# Patient Record
Sex: Female | Born: 1955
Health system: Southern US, Community
[De-identification: ages and names within clinical notes are randomized; demographics above are authoritative.]

## PROBLEM LIST (undated history)

## (undated) DIAGNOSIS — K5190 Ulcerative colitis, unspecified, without complications: Secondary | ICD-10-CM

## (undated) DIAGNOSIS — C20 Malignant neoplasm of rectum: Secondary | ICD-10-CM

## (undated) DIAGNOSIS — K529 Noninfective gastroenteritis and colitis, unspecified: Secondary | ICD-10-CM

## (undated) DIAGNOSIS — H2513 Age-related nuclear cataract, bilateral: Secondary | ICD-10-CM

## (undated) DIAGNOSIS — B0052 Herpesviral keratitis: Secondary | ICD-10-CM

## (undated) DIAGNOSIS — I10 Essential (primary) hypertension: Secondary | ICD-10-CM

## (undated) HISTORY — DX: Herpesviral keratitis: B00.52

## (undated) HISTORY — PX: COLONOSCOPY: SHX174

## (undated) HISTORY — PX: BREAST LUMPECTOMY: SHX2

## (undated) HISTORY — DX: Age-related nuclear cataract, bilateral: H25.13

## (undated) HISTORY — DX: Noninfective gastroenteritis and colitis, unspecified: K52.9

## (undated) HISTORY — DX: Essential (primary) hypertension: I10

## (undated) HISTORY — DX: Ulcerative colitis, unspecified, without complications: K51.90

## (undated) HISTORY — PX: ABDOMINAL HYSTERECTOMY: SHX81

## (undated) HISTORY — PX: TUBAL LIGATION: SHX77

---
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
## 1981-05-20 HISTORY — PX: BREAST CYST ASPIRATION: SHX578

## 2000-12-12 ENCOUNTER — Encounter: Admission: RE | Admit: 2000-12-12 | Discharge: 2000-12-12 | Payer: Self-pay | Admitting: Family Medicine

## 2000-12-12 ENCOUNTER — Encounter: Payer: Self-pay | Admitting: Family Medicine

## 2008-02-19 ENCOUNTER — Other Ambulatory Visit: Admission: RE | Admit: 2008-02-19 | Discharge: 2008-02-19 | Payer: Self-pay | Admitting: Family Medicine

## 2011-09-16 ENCOUNTER — Other Ambulatory Visit (HOSPITAL_COMMUNITY): Payer: Self-pay | Admitting: Family Medicine

## 2011-09-16 DIAGNOSIS — Z1231 Encounter for screening mammogram for malignant neoplasm of breast: Secondary | ICD-10-CM

## 2011-10-10 ENCOUNTER — Ambulatory Visit (HOSPITAL_COMMUNITY)
Admission: RE | Admit: 2011-10-10 | Discharge: 2011-10-10 | Disposition: A | Discharge status: Home / Self Care | Payer: BC Managed Care – PPO | Source: Ambulatory Visit | Attending: Family Medicine | Admitting: Family Medicine

## 2011-10-10 DIAGNOSIS — Z1231 Encounter for screening mammogram for malignant neoplasm of breast: Secondary | ICD-10-CM | POA: Insufficient documentation

## 2011-10-24 ENCOUNTER — Other Ambulatory Visit: Payer: Self-pay | Admitting: Family Medicine

## 2011-10-24 DIAGNOSIS — R928 Other abnormal and inconclusive findings on diagnostic imaging of breast: Secondary | ICD-10-CM

## 2011-10-31 ENCOUNTER — Ambulatory Visit
Admission: RE | Admit: 2011-10-31 | Discharge: 2011-10-31 | Disposition: A | Discharge status: Home / Self Care | Payer: BC Managed Care – PPO | Source: Ambulatory Visit | Attending: Family Medicine | Admitting: Family Medicine

## 2011-10-31 DIAGNOSIS — R928 Other abnormal and inconclusive findings on diagnostic imaging of breast: Secondary | ICD-10-CM

## 2012-06-04 ENCOUNTER — Ambulatory Visit (INDEPENDENT_AMBULATORY_CARE_PROVIDER_SITE_OTHER): Payer: BC Managed Care – PPO | Admitting: Family Medicine

## 2012-06-04 ENCOUNTER — Ambulatory Visit: Payer: BC Managed Care – PPO

## 2012-06-04 VITALS — BP 100/64 | HR 94 | Temp 99.2°F | Resp 16 | Ht 65.5 in | Wt 187.6 lb

## 2012-06-04 DIAGNOSIS — R5383 Other fatigue: Secondary | ICD-10-CM

## 2012-06-04 DIAGNOSIS — J111 Influenza due to unidentified influenza virus with other respiratory manifestations: Secondary | ICD-10-CM

## 2012-06-04 DIAGNOSIS — R0602 Shortness of breath: Secondary | ICD-10-CM

## 2012-06-04 DIAGNOSIS — J069 Acute upper respiratory infection, unspecified: Secondary | ICD-10-CM

## 2012-06-04 LAB — POCT CBC
HCT, POC: 34 % — AB (ref 37.7–47.9)
Hemoglobin: 10.3 g/dL — AB (ref 12.2–16.2)
MCH, POC: 26.9 pg — AB (ref 27–31.2)
MCV: 88.7 fL (ref 80–97)
RBC: 3.83 M/uL — AB (ref 4.04–5.48)
WBC: 9.5 10*3/uL (ref 4.6–10.2)

## 2012-06-04 LAB — GLUCOSE, POCT (MANUAL RESULT ENTRY): POC Glucose: 152 mg/dl — AB (ref 70–99)

## 2012-06-04 MED ORDER — HYDROCOD POLST-CHLORPHEN POLST 10-8 MG/5ML PO LQCR: 5.0000 mL | Freq: Two times a day (BID) | ORAL | Status: DC | PRN

## 2012-06-04 MED ORDER — OSELTAMIVIR PHOSPHATE 75 MG PO CAPS: 75.0000 mg | ORAL_CAPSULE | Freq: Two times a day (BID) | ORAL | Status: DC

## 2012-06-04 NOTE — Patient Instructions (Addendum)
1. URI (upper respiratory infection)  POCT CBC, POCT Influenza A/B, POCT glucose (manual entry), DG Chest 2 View  2. Influenza    3. Fatigue  POCT CBC, POCT Influenza A/B, POCT glucose (manual entry), DG Chest 2 View  4. SOB (shortness of breath)  POCT CBC, POCT Influenza A/B, POCT glucose (manual entry), DG Chest 2 View   Influenza Facts Flu (influenza) is a contagious respiratory illness caused by the influenza viruses. It can cause mild to severe illness. While most healthy people recover from the flu without specific treatment and without complications, older people, young children, and people with certain health conditions are at higher risk for serious complications from the flu, including death. CAUSES   The flu virus is spread from person to person by respiratory droplets from coughing and sneezing.  A person can also become infected by touching an object or surface with a virus on it and then touching their mouth, eye or nose.  Adults may be able to infect others from 1 day before symptoms occur and up to 7 days after getting sick. So it is possible to give someone the flu even before you know you are sick and continue to infect others while you are sick. SYMPTOMS   Fever (usually high).  Headache.  Tiredness (can be extreme).  Cough.  Sore throat.  Runny or stuffy nose.  Body aches.  Diarrhea and vomiting may also occur, particularly in children.  These symptoms are referred to as "flu-like symptoms". A lot of different illnesses, including the common cold, can have similar symptoms. DIAGNOSIS   There are tests that can determine if you have the flu as long you are tested within the first 2 or 3 days of illness.  A doctor's exam and additional tests may be needed to identify if you have a disease that is a complicating the flu. RISKS AND COMPLICATIONS  Some of the complications caused by the flu include:  Bacterial pneumonia or progressive pneumonia caused by the  flu virus.  Loss of body fluids (dehydration).  Worsening of chronic medical conditions, such as heart failure, asthma, or diabetes.  Sinus problems and ear infections. HOME CARE INSTRUCTIONS   Seek medical care early on.  If you are at high risk from complications of the flu, consult your health-care provider as soon as you develop flu-like symptoms. Those at high risk for complications include:  People 65 years or older.  People with chronic medical conditions, including diabetes.  Pregnant women.  Young children.  Your caregiver may recommend use of an antiviral medication to help treat the flu.  If you get the flu, get plenty of rest, drink a lot of liquids, and avoid using alcohol and tobacco.  You can take over-the-counter medications to relieve the symptoms of the flu if your caregiver approves. (Never give aspirin to children or teenagers who have flu-like symptoms, particularly fever). PREVENTION  The single best way to prevent the flu is to get a flu vaccine each fall. Other measures that can help protect against the flu are:  Antiviral Medications  A number of antiviral drugs are approved for use in preventing the flu. These are prescription medications, and a doctor should be consulted before they are used.  Habits for [place] your nose and mouth with a tissue when you cough or sneeze, throw the tissue away after you use it.  Wash your hands often with soap and water, especially after you cough or sneeze. If you  are not near water, use an alcohol-based hand cleaner.  Avoid people who are sick.  If you get the flu, stay home from work or school. Avoid contact with other people so that you do not make them sick, too.  Try not to touch your eyes, nose, or mouth as germs ore often spread this way. IN CHILDREN, EMERGENCY WARNING SIGNS THAT NEED URGENT MEDICAL ATTENTION:  Fast breathing or trouble breathing.  Bluish skin color.  Not drinking enough  fluids.  Not waking up or not interacting.  Being so irritable that the child does not want to be held.  Flu-like symptoms improve but then return with fever and worse cough.  Fever with a rash. IN ADULTS, EMERGENCY WARNING SIGNS THAT NEED URGENT MEDICAL ATTENTION:  Difficulty breathing or shortness of breath.  Pain or pressure in the chest or abdomen.  Sudden dizziness.  Confusion.  Severe or persistent vomiting. SEEK IMMEDIATE MEDICAL CARE IF:  You or someone you know is experiencing any of the symptoms above. When you arrive at the emergency center,report that you think you have the flu. You may be asked to wear a mask and/or sit in a secluded area to protect others from getting sick. MAKE SURE YOU:   Understand these instructions.  Monitor your condition.  Seek medical care if you are getting worse, or not improving. Document Released: 05/09/2003 Document Revised: 07/29/2011 Document Reviewed: 02/02/2009 [facility] Patient Information 2013 [place].

## 2012-06-04 NOTE — Progress Notes (Signed)
Subjective:    Patient ID: Leah Bailey, female    DOB: [birth year], 57 y.o.   MRN: [id number]  HPIThis 57 y.o. female presents for evaluation of cold symptoms. Onset of acute symptoms today.   +HA; no ST; no flu vaccine this season.  +chills/fevers; no sweats.  No ear pain.   +rhinorrhea two weeks; greenish.  +nasal congestion; +PND.  +coughing for two weeks with improvement.  Coughing less.  +sputum green.  +SOB started today.  +vomiting x 8 two days ago; no recurrence.  No diarrhea.  URI two weeks ago with nasal congestion, rhinorrhea, cough.  No medications.  Works at [employer] but none in two days.  No tobacco.    Review of Systems  Constitutional: Positive for fever, chills and fatigue.  HENT: Positive for congestion, rhinorrhea and postnasal drip. Negative for ear pain, sore throat, mouth sores and sinus pressure.   Respiratory: Positive for cough and shortness of breath. Negative for wheezing and stridor.   Gastrointestinal: Positive for nausea and vomiting. Negative for abdominal pain and diarrhea.  Skin: Negative for rash.  Neurological: Positive for headaches. Negative for dizziness.    Past Medical History  Diagnosis Date  . Hypertension     Past Surgical History  Procedure Date  . Abdominal hysterectomy   . Tubal ligation     Prior to Admission medications   Medication Sig Start Date End Date Taking? Authorizing Provider  aspirin 81 MG tablet Take 81 mg by mouth daily.   Yes Historical Provider, MD  lisinopril (PRINIVIL,ZESTRIL) 20 MG tablet Take 20 mg by mouth daily.   Yes Historical Provider, MD  Multiple Vitamins-Calcium (ONE-A-DAY WOMENS PO) Take 1 tablet by mouth daily.   Yes Historical Provider, MD    No Known Allergies  History   Social History  . Marital Status: Married    Spouse Name: N/A    Number of Children: N/A  . Years of Education: N/A   Occupational History  . Not on file.   Social History Main Topics    . Smoking status: Never Smoker   . Smokeless tobacco: Not on file  . Alcohol Use: No  . Drug Use: No  . Sexually Active: Yes   Other Topics Concern  . Not on file   Social History Narrative  . No narrative on file    Family History  Problem Relation Age of Onset  . Heart disease Mother   . Diabetes Mother   . Heart attack Father   . Diabetes Sister   . Diabetes Brother        Objective:   Physical Exam  Nursing note and vitals reviewed. Constitutional: She is oriented to person, place, and time. She appears well-developed and well-nourished. No distress.  HENT:  Head: Normocephalic and atraumatic.  Right Ear: External ear normal.  Left Ear: External ear normal.  Nose: Mucosal edema and rhinorrhea present.  Mouth/Throat: Oropharynx is clear and moist.  Eyes: Conjunctivae normal are normal. Pupils are equal, round, and reactive to light.  Neck: Normal range of motion. Neck supple.  Cardiovascular: Normal rate, regular rhythm and normal heart sounds.  Exam reveals no gallop and no friction rub.   No murmur heard. Pulmonary/Chest: Effort normal and breath sounds normal. She has no wheezes. She has no rales.  Abdominal: Soft. Bowel sounds are normal. There is no tenderness. There is no rebound and no guarding.  Lymphadenopathy:    She has no cervical adenopathy.  Neurological:  She is alert and oriented to person, place, and time.  Skin: Skin is warm and dry. No rash noted. She is not diaphoretic.  Psychiatric: She has a normal mood and affect. Her behavior is normal.    Results for orders placed in visit on 06/04/12  POCT CBC      Component Value Range   WBC 9.5  4.6 - 10.2 K/uL   Lymph, poc 1.5  0.6 - 3.4   POC LYMPH PERCENT 15.3  10 - 50 %L   MID (cbc) 0.4  0 - 0.9   POC MID % 4.4  0 - 12 %M   POC Granulocyte 7.6 (*) 2 - 6.9   Granulocyte percent 80.3 (*) 37 - 80 %G   RBC 3.83 (*) 4.04 - 5.48 M/uL   Hemoglobin 10.3 (*) 12.2 - 16.2 g/dL   HCT, POC 34.0 (*) 37.7  - 47.9 %   MCV 88.7  80 - 97 fL   MCH, POC 26.9 (*) 27 - 31.2 pg   MCHC 30.3 (*) 31.8 - 35.4 g/dL   RDW, POC 14.4     Platelet Count, POC 349  142 - 424 K/uL   MPV 7.6  0 - 99.8 fL  POCT INFLUENZA A/B      Component Value Range   Influenza A, POC Negative     Influenza B, POC Negative    GLUCOSE, POCT (MANUAL RESULT ENTRY)      Component Value Range   POC Glucose 152 (*) 70 - 99 mg/dl   [facility] reading (PRIMARY) by  Dr. Tamala Julian.  CXR: NAD     Assessment & Plan:   1. URI (upper respiratory infection)  POCT CBC, POCT Influenza A/B, POCT glucose (manual entry), DG Chest 2 View  2. Influenza    3. Fatigue  POCT CBC, POCT Influenza A/B, POCT glucose (manual entry), DG Chest 2 View  4. SOB (shortness of breath)  POCT CBC, POCT Influenza A/B, POCT glucose (manual entry), DG Chest 2 View     1.  Influenza:  New.  Rx for Tamiflu, Tussionex provided.  Recommend Mucinex DM bid PRN during the day. Recommend Ibuprofen or Tylenol for fever, sore throat, body aches. RTC for acute worsening, worsening SOB, persistent symptoms.  [facility] note for 06/05/12. 2. SOB: New.  Associated with influenza symptoms.  CXR negative; pulse oximetry normal.  No respiratory distress in office.  RTC for acute worsening.   Meds ordered this encounter  Medications  . aspirin 81 MG tablet    Sig: Take 81 mg by mouth daily.  Marland Kitchen lisinopril (PRINIVIL,ZESTRIL) 20 MG tablet    Sig: Take 20 mg by mouth daily.  . Multiple Vitamins-Calcium (ONE-A-DAY WOMENS PO)    Sig: Take 1 tablet by mouth daily.  Marland Kitchen oseltamivir (TAMIFLU) 75 MG capsule    Sig: Take 1 capsule (75 mg total) by mouth 2 (two) times daily.    Dispense:  10 capsule    Refill:  0  . chlorpheniramine-HYDROcodone (TUSSIONEX PENNKINETIC ER) 10-8 MG/5ML LQCR    Sig: Take 5 mLs by mouth every 12 (twelve) hours as needed.    Dispense:  240 mL    Refill:  0

## 2013-02-22 ENCOUNTER — Other Ambulatory Visit (HOSPITAL_COMMUNITY): Payer: Self-pay | Admitting: Family Medicine

## 2013-02-22 DIAGNOSIS — Z1231 Encounter for screening mammogram for malignant neoplasm of breast: Secondary | ICD-10-CM

## 2013-03-05 ENCOUNTER — Ambulatory Visit (HOSPITAL_COMMUNITY)
Admission: RE | Admit: 2013-03-05 | Discharge: 2013-03-05 | Disposition: A | Discharge status: Home / Self Care | Payer: BC Managed Care – PPO | Source: Ambulatory Visit | Attending: Family Medicine | Admitting: Family Medicine

## 2013-03-05 DIAGNOSIS — Z1231 Encounter for screening mammogram for malignant neoplasm of breast: Secondary | ICD-10-CM | POA: Insufficient documentation

## 2013-03-05 LAB — HM MAMMOGRAPHY

## 2013-10-16 ENCOUNTER — Ambulatory Visit (INDEPENDENT_AMBULATORY_CARE_PROVIDER_SITE_OTHER): Payer: BC Managed Care – PPO | Admitting: Family Medicine

## 2013-10-16 VITALS — BP 120/80 | HR 72 | Temp 98.0°F | Resp 20 | Ht 65.5 in | Wt 189.2 lb

## 2013-10-16 DIAGNOSIS — H109 Unspecified conjunctivitis: Secondary | ICD-10-CM

## 2013-10-16 DIAGNOSIS — H538 Other visual disturbances: Secondary | ICD-10-CM

## 2013-10-16 MED ORDER — POLYMYXIN B-TRIMETHOPRIM 10000-0.1 UNIT/ML-% OP SOLN: 2.0000 [drp] | Freq: Four times a day (QID) | OPHTHALMIC | Status: DC

## 2013-10-16 NOTE — Progress Notes (Signed)
Chief Complaint:  Chief Complaint  Patient presents with  . Eye Pain    right eye is swollen, red, burning some, itches some and this started today.  did have a torn cornea back in feb.  vision is blurry.      HPI: Leah Bailey is a 58 y.o. female who is here for  Right eye redness, swelling, itching, and burning since Thursday. Similar to sxs she had with corneal abrasion except for swelling, she has used systane and did not get better. Today swelling of her eyelieds. She has had a history of corneal abrasion prior to this on same eye after waking up one day , she thinks at that time may have scratched her eye on sheets, but they do not really know. She was put on drops for this. She does not know if there is any scarring associated with it after it healed. Today she has some light sensitivity , blurred vision, drainage and clear dc. No fevers or chills. No pain in the back of her eyeball that has worsen. She has no facial pain or burning or rash. She denies having shingles in the past.   Past Medical History  Diagnosis Date  . Hypertension    Past Surgical History  Procedure Laterality Date  . Abdominal hysterectomy    . Tubal ligation     History   Social History  . Marital Status: Married    Spouse Name: N/A    Number of Children: N/A  . Years of Education: N/A   Social History Main Topics  . Smoking status: Never Smoker   . Smokeless tobacco: None  . Alcohol Use: No  . Drug Use: No  . Sexual Activity: Yes   Other Topics Concern  . None   Social History Narrative  . None   Family History  Problem Relation Age of Onset  . Heart disease Mother   . Diabetes Mother   . Heart attack Father   . Diabetes Sister   . Diabetes Brother    No Known Allergies Prior to Admission medications   Medication Sig Start Date End Date Taking? Authorizing Provider  aspirin 81 MG tablet Take 81 mg by mouth daily.   Yes Historical Provider, MD  ferrous sulfate 325 (65  FE) MG EC tablet Take 325 mg by mouth daily with breakfast.   Yes Historical Provider, MD  lisinopril (PRINIVIL,ZESTRIL) 20 MG tablet Take 20 mg by mouth daily.   Yes Historical Provider, MD  Multiple Vitamins-Calcium (ONE-A-DAY WOMENS PO) Take 1 tablet by mouth daily.   Yes Historical Provider, MD     ROS: The patient denies fevers, chills, night sweats, unintentional weight loss, chest pain, palpitations, wheezing, dyspnea on exertion, nausea, vomiting, abdominal pain, dysuria, hematuria, melena, numbness, weakness, or tingling.  All other systems have been reviewed and were otherwise negative with the exception of those mentioned in the HPI and as above.    PHYSICAL EXAM: Filed Vitals:   10/16/13 1536  BP: 120/80  Pulse: 72  Temp: 98 F (36.7 C)  Resp: 20   Filed Vitals:   10/16/13 1536  Height: 5' 5.5" (1.664 m)  Weight: 189 lb 3.2 oz (85.821 kg)   Body mass index is 30.99 kg/(m^2).  General: Alert, no acute distress HEENT:  Normocephalic, atraumatic, oropharynx patent. EOMI, PERRLA, right eye conjunctival erythema, + dc, , no pick up of fluoroscein dye however there is a area in the center of her eye that  has a gelatinous/fluid wave Cardiovascular:  Regular rate and rhythm, no rubs murmurs or gallops.  No Carotid bruits, radial pulse intact. No pedal edema.  Respiratory: Clear to auscultation bilaterally.  No wheezes, rales, or rhonchi.  No cyanosis, no use of accessory musculature GI: No organomegaly, abdomen is soft and non-tender, positive bowel sounds.  No masses. Skin: No rashes. Neurologic: Facial musculature symmetric. Psychiatric: Patient is appropriate throughout our interaction. Lymphatic: No cervical lymphadenopathy Musculoskeletal: Gait intact  LABS: Results for orders placed in visit on 06/04/12  POCT CBC      Result Value Ref Range   WBC 9.5  4.6 - 10.2 K/uL   Lymph, poc 1.5  0.6 - 3.4   POC LYMPH PERCENT 15.3  10 - 50 %L   MID (cbc) 0.4  0 - 0.9   POC  MID % 4.4  0 - 12 %M   POC Granulocyte 7.6 (*) 2 - 6.9   Granulocyte percent 80.3 (*) 37 - 80 %G   RBC 3.83 (*) 4.04 - 5.48 M/uL   Hemoglobin 10.3 (*) 12.2 - 16.2 g/dL   HCT, POC 34.0 (*) 37.7 - 47.9 %   MCV 88.7  80 - 97 fL   MCH, POC 26.9 (*) 27 - 31.2 pg   MCHC 30.3 (*) 31.8 - 35.4 g/dL   RDW, POC 14.4     Platelet Count, POC 349  142 - 424 K/uL   MPV 7.6  0 - 99.8 fL  POCT INFLUENZA A/B      Result Value Ref Range   Influenza A, POC Negative     Influenza B, POC Negative    GLUCOSE, POCT (MANUAL RESULT ENTRY)      Result Value Ref Range   POC Glucose 152 (*) 70 - 99 mg/dl     EKG/XRAY:   Primary read interpreted [facility]. Marin Comment at [facility].   ASSESSMENT/PLAN: Encounter Diagnoses  Name Primary?  . Conjunctivitis Yes  . Blurred vision    58 y/o female with prior corneal abrasion She has no uptake of fluoroscein dye on eye exam but I suspect there is something in the center of her eye/iris. There is a fluid wave of the sclera  She will be given polytrim gtt and asked to see optho ( she already has one) on Monday Morning.  Vision is 20/50 and 20/30 respectively right/left  Gross sideeffects, risk and benefits, and alternatives of medications d/w patient. Patient is aware that all medications have potential sideeffects and we are unable to predict every sideeffect or drug-drug interaction that may occur.  Glenford Bayley, DO 10/16/2013 5:19 PM

## 2013-10-20 DIAGNOSIS — B0052 Herpesviral keratitis: Secondary | ICD-10-CM

## 2013-10-20 HISTORY — DX: Herpesviral keratitis: B00.52

## 2014-11-10 ENCOUNTER — Encounter: Payer: Self-pay | Admitting: *Deleted

## 2014-11-30 DIAGNOSIS — H2513 Age-related nuclear cataract, bilateral: Secondary | ICD-10-CM

## 2014-11-30 HISTORY — DX: Age-related nuclear cataract, bilateral: H25.13

## 2015-04-03 ENCOUNTER — Other Ambulatory Visit: Payer: Self-pay

## 2015-04-03 DIAGNOSIS — Z1231 Encounter for screening mammogram for malignant neoplasm of breast: Secondary | ICD-10-CM

## 2015-04-24 ENCOUNTER — Ambulatory Visit
Admission: RE | Admit: 2015-04-24 | Discharge: 2015-04-24 | Disposition: A | Discharge status: Home / Self Care | Payer: 59 | Source: Ambulatory Visit

## 2015-04-24 DIAGNOSIS — Z1231 Encounter for screening mammogram for malignant neoplasm of breast: Secondary | ICD-10-CM

## 2017-10-24 ENCOUNTER — Encounter: Payer: Self-pay | Admitting: Physician Assistant

## 2017-10-24 ENCOUNTER — Other Ambulatory Visit: Payer: Self-pay

## 2017-10-24 ENCOUNTER — Ambulatory Visit (INDEPENDENT_AMBULATORY_CARE_PROVIDER_SITE_OTHER): Payer: BLUE CROSS/BLUE SHIELD | Admitting: Physician Assistant

## 2017-10-24 ENCOUNTER — Encounter: Payer: Self-pay | Admitting: Internal Medicine

## 2017-10-24 VITALS — BP 130/84 | HR 75 | Temp 98.8°F | Resp 18 | Ht 65.55 in | Wt 171.8 lb

## 2017-10-24 DIAGNOSIS — Z0000 Encounter for general adult medical examination without abnormal findings: Secondary | ICD-10-CM

## 2017-10-24 DIAGNOSIS — Z1159 Encounter for screening for other viral diseases: Secondary | ICD-10-CM | POA: Diagnosis not present

## 2017-10-24 DIAGNOSIS — Z1329 Encounter for screening for other suspected endocrine disorder: Secondary | ICD-10-CM | POA: Diagnosis not present

## 2017-10-24 DIAGNOSIS — Z13228 Encounter for screening for other metabolic disorders: Secondary | ICD-10-CM

## 2017-10-24 DIAGNOSIS — Z1211 Encounter for screening for malignant neoplasm of colon: Secondary | ICD-10-CM

## 2017-10-24 DIAGNOSIS — Z1389 Encounter for screening for other disorder: Secondary | ICD-10-CM

## 2017-10-24 DIAGNOSIS — Z130 Encounter for screening for diseases of the blood and blood-forming organs and certain disorders involving the immune mechanism: Secondary | ICD-10-CM

## 2017-10-24 DIAGNOSIS — Z1231 Encounter for screening mammogram for malignant neoplasm of breast: Secondary | ICD-10-CM

## 2017-10-24 DIAGNOSIS — Z13220 Encounter for screening for lipoid disorders: Secondary | ICD-10-CM

## 2017-10-24 DIAGNOSIS — Z114 Encounter for screening for human immunodeficiency virus [HIV]: Secondary | ICD-10-CM | POA: Diagnosis not present

## 2017-10-24 DIAGNOSIS — I10 Essential (primary) hypertension: Secondary | ICD-10-CM | POA: Diagnosis not present

## 2017-10-24 MED ORDER — LISINOPRIL-HYDROCHLOROTHIAZIDE 20-25 MG PO TABS: 1.0000 | ORAL_TABLET | Freq: Every day | ORAL | 3 refills | Status: AC

## 2017-10-24 NOTE — Patient Instructions (Addendum)
   IF you received an x-ray today, you will receive an invoice from [facility]. Please contact [facility] at [phone] with questions or concerns regarding your invoice.   IF you received labwork today, you will receive an invoice from [facility]. Please contact [facility] at [phone] with questions or concerns regarding your invoice.   Our billing staff will not be able to assist you with questions regarding bills from these companies.  You will be contacted with the lab results as soon as they are available. The fastest way to get your results is to activate your My Chart account. Instructions are located on the last page of this paperwork. If you have not heard from us regarding the results in 2 weeks, please contact this office.        IF you received an x-ray today, you will receive an invoice from [facility]. Please contact [facility] at [phone] with questions or concerns regarding your invoice.   IF you received labwork today, you will receive an invoice from [facility]. Please contact [facility] at [phone] with questions or concerns regarding your invoice.   Our billing staff will not be able to assist you with questions regarding bills from these companies.  You will be contacted with the lab results as soon as they are available. The fastest way to get your results is to activate your My Chart account. Instructions are located on the last page of this paperwork. If you have not heard from us regarding the results in 2 weeks, please contact this office.     Preventive Care 40-64 Years, Female Preventive care refers to lifestyle choices and visits with your health care provider that can promote health and wellness. What does preventive care include?  A yearly physical exam. This is also called an annual well check.  Dental exams once or twice a year.  Routine eye exams. Ask your health care provider how often you should have  your eyes checked.  Personal lifestyle choices, including: ? Daily care of your teeth and gums. ? Regular physical activity. ? Eating a healthy diet. ? Avoiding tobacco and drug use. ? Limiting alcohol use. ? Practicing safe sex. ? Taking low-dose aspirin daily starting at age 50. ? Taking vitamin and mineral supplements as recommended by your health care provider. What happens during an annual well check? The services and screenings done by your health care provider during your annual well check will depend on your age, overall health, lifestyle risk factors, and family history of disease. Counseling Your health care provider may ask you questions about your:  Alcohol use.  Tobacco use.  Drug use.  Emotional well-being.  Home and relationship well-being.  Sexual activity.  Eating habits.  Work and work environment.  Method of birth control.  Menstrual cycle.  Pregnancy history.  Screening You may have the following tests or measurements:  Height, weight, and BMI.  Blood pressure.  Lipid and cholesterol levels. These may be checked every 5 years, or more frequently if you are over 50 years old.  Skin check.  Lung cancer screening. You may have this screening every year starting at age 55 if you have a 30-pack-year history of smoking and currently smoke or have quit within the past 15 years.  Fecal occult blood test (FOBT) of the stool. You may have this test every year starting at age 50.  Flexible sigmoidoscopy or colonoscopy. You may have a sigmoidoscopy every 5 years or a colonoscopy every 10 years starting   at age 32.  Hepatitis C blood test.  Hepatitis B blood test.  Sexually transmitted disease (STD) testing.  Diabetes screening. This is done by checking your blood sugar (glucose) after you have not eaten for a while (fasting). You may have this done every 1-3 years.  Mammogram. This may be done every 1-2 years. Talk to your health care provider  about when you should start having regular mammograms. This may depend on whether you have a family history of breast cancer.  BRCA-related cancer screening. This may be done if you have a family history of breast, ovarian, tubal, or peritoneal cancers.  Pelvic exam and Pap test. This may be done every 3 years starting at age 62. Starting at age 66, this may be done every 5 years if you have a Pap test in combination with an HPV test.  Bone density scan. This is done to screen for osteoporosis. You may have this scan if you are at high risk for osteoporosis.  Discuss your test results, treatment options, and if necessary, the need for more tests with your health care provider. Vaccines Your health care provider may recommend certain vaccines, such as:  Influenza vaccine. This is recommended every year.  Tetanus, diphtheria, and acellular pertussis (Tdap, Td) vaccine. You may need a Td booster every 10 years.  Varicella vaccine. You may need this if you have not been vaccinated.  Zoster vaccine. You may need this after age 41.  Measles, mumps, and rubella (MMR) vaccine. You may need at least one dose of MMR if you were born in 1957 or later. You may also need a second dose.  Pneumococcal 13-valent conjugate (PCV13) vaccine. You may need this if you have certain conditions and were not previously vaccinated.  Pneumococcal polysaccharide (PPSV23) vaccine. You may need one or two doses if you smoke cigarettes or if you have certain conditions.  Meningococcal vaccine. You may need this if you have certain conditions.  Hepatitis A vaccine. You may need this if you have certain conditions or if you travel or work in places where you may be exposed to hepatitis A.  Hepatitis B vaccine. You may need this if you have certain conditions or if you travel or work in places where you may be exposed to hepatitis B.  Haemophilus influenzae type b (Hib) vaccine. You may need this if you have certain  conditions.  Talk to your health care provider about which screenings and vaccines you need and how often you need them. This information is not intended to replace advice given to you by your health care provider. Make sure you discuss any questions you have with your health care provider. Document Released: 06/02/2015 Document Revised: 01/24/2016 Document Reviewed: 03/07/2015 Elsevier Interactive Patient Education  [employer].

## 2017-10-24 NOTE — Progress Notes (Signed)
Patient ID: Leah Bailey, female    DOB: Sep 15, 1955, 62 y.o.   MRN: [id number]  PCP: Jonathon Jordan, MD  Chief Complaint  Patient presents with  . Annual Exam    Subjective:   Presents for [employer]. She is new to this practice. She has a balance at her PCP's office, and cannot be seen there until it is paid off.  Cervical Cancer Screening: s/p hysterectomy for uterine fibroids. No previous history of abnormal pap. Breast Cancer Screening: monthly SBE, annual SBE, last mammogram 2 years ago. Colorectal Cancer Screening: due for colonoscopy Bone Density Testing: not yet a candidate HIV Screening: very low risk. STI Screening: very low risk. Seasonal Influenza Vaccination: annually Td/Tdap Vaccination: 2015, by report Pneumococcal Vaccination: not yet a candidate Zoster Vaccination: not yet a candidate Frequency of Dental evaluation: Q6 months (upper denture plate, lower partial plate Frequency of Eye evaluation: annually  Review of Systems Constitutional: Negative for chills, fatigue, fever and unexpected weight change.  HENT: Negative for congestion, hearing loss, rhinorrhea, sore throat and tinnitus.   Eyes: Negative for photophobia, pain and visual disturbance.  Respiratory: Negative for cough, chest tightness and shortness of breath.   Cardiovascular: Negative for chest pain and palpitations.  Gastrointestinal: Negative for abdominal pain, blood in stool, constipation, diarrhea and vomiting.  Endocrine: Negative for cold intolerance, heat intolerance and polyuria.  Genitourinary: Negative for difficulty urinating, dysuria, frequency, hematuria, menstrual problem, vaginal bleeding, vaginal discharge and vaginal pain.  Musculoskeletal: Positive for arthralgias ("getting old") and myalgias ("getting old").  Skin: Negative for color change and rash.  Neurological: Negative for dizziness, numbness and headaches.  Psychiatric/Behavioral: Negative for  dysphoric mood. The patient is not nervous/anxious.    Depression screen PHQ 2/9 10/24/2017  Decreased Interest 0  Down, Depressed, Hopeless 0  PHQ - 2 Score 0    Patient Active Problem List   Diagnosis Date Noted  . Essential hypertension, benign 10/24/2017    Past Medical History:  Diagnosis Date  . Herpes simplex disciform keratitis 10/20/2013  . Hypertension   . Nuclear sclerosis of both eyes 11/30/2014     Prior to Admission medications   Medication Sig Start Date End Date Taking? Authorizing Provider  aspirin 81 MG tablet Take 81 mg by mouth daily.   Yes [provider]  ferrous sulfate 325 (65 FE) MG EC tablet Take 325 mg by mouth daily with breakfast.   Yes [provider]  lisinopril-hydrochlorothiazide (PRINZIDE,ZESTORETIC) 20-25 MG tablet  11/09/14  Yes [provider]  Multiple Vitamins-Calcium (ONE-A-DAY WOMENS PO) Take 1 tablet by mouth daily.   Yes [provider]    No Known Allergies  Past Surgical History:  Procedure Laterality Date  . ABDOMINAL HYSTERECTOMY    . TUBAL LIGATION      Family History  Problem Relation Age of Onset  . Diabetes Mother   . Heart attack Father   . Heart disease Father   . Diabetes Sister   . Diabetes Brother   . Diabetes Sister   . Healthy Son   . Healthy Son   . Diabetes Brother     Social History   Socioeconomic History  . Marital status: Married    Spouse name: Not on file  . Number of children: 2  . Years of education: Not on file  . Highest education level: 12th grade  Occupational History  . Occupation: Forensic psychologist: [employer]  Social Needs  . Financial resource strain:  Not hard at all  . Food insecurity:    Worry: Never true    Inability: Never true  . Transportation needs:    Medical: No    Non-medical: No  Tobacco Use  . Smoking status: Never Smoker  . Smokeless tobacco: Never Used  Substance and Sexual Activity  . Alcohol use: No  . Drug use: No  .  Sexual activity: Yes  Lifestyle  . Physical activity:    Days per week: 3 days    Minutes per session: 150+ min  . Stress: Only a little  Relationships  . Social connections:    Talks on phone: More than three times a week    Gets together: Once a week    Attends religious service: More than 4 times per year    Active member of club or organization: Yes    Attends meetings of clubs or organizations: More than 4 times per year    Relationship status: Married  Other Topics Concern  . Not on file  Social History Narrative   Lives with her husband.   Their adult sons live nearby.        Objective:  Physical Exam  Constitutional: She is oriented to person, place, and time. Vital signs are normal. She appears well-developed and well-nourished. She is active and cooperative. No distress.  BP 130/84 (BP Location: Right Arm, Patient Position: Sitting, Cuff Size: Normal)   Pulse 75   Temp 98.8 F (37.1 C) (Oral)   Resp 18   Ht 5' 5.55" (1.665 m)   Wt 171 lb 12.8 oz (77.9 kg)   SpO2 97%   BMI 28.11 kg/m    HENT:  Head: Normocephalic and atraumatic.  Right Ear: Hearing, tympanic membrane, external ear and ear canal normal. No foreign bodies.  Left Ear: Hearing, tympanic membrane, external ear and ear canal normal. No foreign bodies.  Nose: Nose normal.  Mouth/Throat: Uvula is midline, oropharynx is clear and moist and mucous membranes are normal. No oral lesions. Normal dentition. No dental abscesses or uvula swelling. No oropharyngeal exudate.  Eyes: Pupils are equal, round, and reactive to light. Conjunctivae, EOM and lids are normal. Right eye exhibits no discharge. Left eye exhibits no discharge. No scleral icterus.  Fundoscopic exam:      The right eye shows no arteriolar narrowing, no AV nicking, no exudate, no hemorrhage and no papilledema. The right eye shows red reflex.       The left eye shows no arteriolar narrowing, no AV nicking, no exudate, no hemorrhage and no  papilledema. The left eye shows red reflex.  Neck: Trachea normal, normal range of motion and full passive range of motion without pain. Neck supple. No spinous process tenderness and no muscular tenderness present. No thyroid mass and no thyromegaly present.  Cardiovascular: Normal rate, regular rhythm, normal heart sounds, intact distal pulses and normal pulses.  Pulmonary/Chest: Effort normal and breath sounds normal. Right breast exhibits no inverted nipple, no mass, no nipple discharge, no skin change and no tenderness. Left breast exhibits no inverted nipple, no mass, no nipple discharge, no skin change and no tenderness. Breasts are symmetrical.  Abdominal: Soft. Bowel sounds are normal. She exhibits no distension and no mass. There is no tenderness. There is no rebound and no guarding. No hernia.  Musculoskeletal: She exhibits no edema or tenderness.       Cervical back: Normal.       Thoracic back: Normal.       Lumbar  back: Normal.  Lymphadenopathy:       Head (right side): No tonsillar, no preauricular, no posterior auricular and no occipital adenopathy present.       Head (left side): No tonsillar, no preauricular, no posterior auricular and no occipital adenopathy present.    She has no cervical adenopathy.       Right: No supraclavicular adenopathy present.       Left: No supraclavicular adenopathy present.  Neurological: She is alert and oriented to person, place, and time. She has normal strength and normal reflexes. No cranial nerve deficit. She exhibits normal muscle tone. Coordination and gait normal.  Skin: Skin is warm, dry and intact. No rash noted. She is not diaphoretic. No cyanosis or erythema. Nails show no clubbing.  Psychiatric: She has a normal mood and affect. Her speech is normal and behavior is normal. Judgment and thought content normal.     Wt Readings from Last 3 Encounters:  10/24/17 171 lb 12.8 oz (77.9 kg)  10/16/13 189 lb 3.2 oz (85.8 kg)  06/04/12 187  lb 9.6 oz (85.1 kg)      Assessment & Plan:   Problem List Items Addressed This Visit    Essential hypertension, benign    Controlled. Continue lisinoprilHCT 20/25 daily.      Relevant Medications   lisinopril-hydrochlorothiazide (PRINZIDE,ZESTORETIC) 20-25 MG tablet   Other Relevant Orders   CBC with Differential/Platelet   Comprehensive metabolic panel   TSH   Urinalysis, dipstick only    Other Visit Diagnoses    Annual physical exam    -  Primary   Age appropriate health guidance provided.   Screening for deficiency anemia       Relevant Orders   CBC with Differential/Platelet   Screening for colon cancer       Relevant Orders   Ambulatory referral to Gastroenterology   Screening for HIV (human immunodeficiency virus)       Relevant Orders   HIV antibody   Screening for hyperlipidemia       Relevant Orders   Lipid panel   Screening for metabolic disorder       Relevant Orders   Comprehensive metabolic panel   Screening for thyroid disorder       Relevant Orders   TSH   Screening for blood or protein in urine       Relevant Orders   Urinalysis, dipstick only   Encounter for screening mammogram for breast cancer       Relevant Orders   MM Digital Screening   Need for hepatitis C screening test       Relevant Orders   Hepatitis C antibody       Return in about 6 months (around 04/25/2018) for re-evaluaiton of blood pressure.   Fara Chute, PA-C [facility]

## 2017-10-24 NOTE — Assessment & Plan Note (Signed)
Controlled. Continue lisinoprilHCT 20/25 daily.

## 2017-10-24 NOTE — Progress Notes (Signed)
Subjective:    Patient ID: Leah Bailey, female    DOB: 12-22-1955, 62 y.o.   MRN: [id number]  Leah Bailey is a 62 year old female presenting for her annual wellness exam. Does not have any particular complaints. Previous primary care provider was Dr. Jonathon Jordan, but husband's insurance has changed and she is unable to see Dr. Stephanie Acre until she pays insurance. States she feels great, but has "gotten a little lazy" with exercise lately due to the heat.    Would like to know if B12 is permissible to take with her high blood pressure. She is not extremely tired but would like "a little extra boost."  Presents for [employer] Visit. Cervical Cancer Screening: Breast Cancer Screening:  Colorectal Cancer Screening: last one was when she was 52 schedule colonoscopy  Bone Density Testing:  HIV Screening: collecting STI Screening: low risk Seasonal Influenza Vaccination:  Td/Tdap Vaccination:  Pneumococcal Vaccination:  Zoster Vaccination: Frequency of Dental evaluation: yearly Frequency of Eye evaluation: yearly     Review of Systems  Constitutional: Negative for chills, fatigue, fever and unexpected weight change.  HENT: Negative for congestion, hearing loss, rhinorrhea, sore throat and tinnitus.   Eyes: Negative for photophobia, pain and visual disturbance.  Respiratory: Negative for cough, chest tightness and shortness of breath.   Cardiovascular: Negative for chest pain and palpitations.  Gastrointestinal: Negative for abdominal pain, blood in stool, constipation, diarrhea and vomiting.  Endocrine: Negative for cold intolerance, heat intolerance and polyuria.  Genitourinary: Negative for difficulty urinating, dysuria, frequency, hematuria, menstrual problem, vaginal bleeding, vaginal discharge and vaginal pain.  Musculoskeletal: Positive for arthralgias ("getting old") and myalgias ("getting old").  Skin: Negative for color change and rash.  Neurological:  Negative for dizziness, numbness and headaches.  Psychiatric/Behavioral: Negative for dysphoric mood. The patient is not nervous/anxious.        Objective:   Physical Exam  Constitutional: She is oriented to person, place, and time. She appears well-developed and well-nourished. No distress.  HENT:  Head: Normocephalic and atraumatic.  Eyes: Pupils are equal, round, and reactive to light.  Neck: No thyromegaly present.  Cardiovascular: Normal rate, regular rhythm and normal heart sounds.  Pulses:      Radial pulses are 2+ on the right side, and 2+ on the left side.       Dorsalis pedis pulses are 2+ on the right side, and 2+ on the left side.       Posterior tibial pulses are 2+ on the right side, and 2+ on the left side.  Pulmonary/Chest: Effort normal and breath sounds normal. No stridor. She has no wheezes. She has no rales. Right breast exhibits no inverted nipple and no mass. Left breast exhibits no inverted nipple and no mass. No breast swelling or tenderness.  Lymphadenopathy:    She has no cervical adenopathy.    She has no axillary adenopathy.  Neurological: She is alert and oriented to person, place, and time.  Reflex Scores:      Bicep reflexes are 2+ on the right side and 2+ on the left side.      Patellar reflexes are 2+ on the right side and 2+ on the left side.      Achilles reflexes are 2+ on the right side and 2+ on the left side. Skin: Skin is warm and dry.  Psychiatric: She has a normal mood and affect.          Assessment & Plan:  1. Annual physical exam Healthy female  with benign exam; no immediate intervention needed at this time.   2. Screening for deficiency anemia - CBC with Differential/Platelet  3. Screening for colon cancer - Ambulatory referral to Gastroenterology  4. Screening for HIV (human immunodeficiency virus) - HIV antibody  5. Screening for hyperlipidemia - Lipid panel  6. Screening for metabolic disorder - Comprehensive  metabolic panel  7. Screening for thyroid disorder - TSH  8. Screening for blood or protein in urine - Urinalysis, dipstick only  9. Encounter for screening mammogram for breast cancer - MM Digital Screening; Future  10. Need for hepatitis C screening test - Hepatitis C antibody  11. Essential hypertension, benign Well controlled; continue current regimen. - CBC with Differential/Platelet - Comprehensive metabolic panel - TSH - Urinalysis, dipstick only - lisinopril-hydrochlorothiazide (PRINZIDE,ZESTORETIC) 20-25 MG tablet; Take 1 tablet by mouth daily.  Dispense: 90 tablet; Refill: 3  Discussed lack of necessity of supplementing B12 unless abnormalities return from labs. Supplementing should not be harmful, but it will likely not provide any benefit either.

## 2017-10-25 ENCOUNTER — Encounter: Payer: Self-pay | Admitting: Physician Assistant

## 2017-10-25 DIAGNOSIS — E785 Hyperlipidemia, unspecified: Secondary | ICD-10-CM | POA: Insufficient documentation

## 2017-10-25 DIAGNOSIS — R739 Hyperglycemia, unspecified: Secondary | ICD-10-CM | POA: Insufficient documentation

## 2017-10-25 LAB — COMPREHENSIVE METABOLIC PANEL
ALT: 25 IU/L (ref 0–32)
AST: 19 IU/L (ref 0–40)
Albumin/Globulin Ratio: 1.5 (ref 1.2–2.2)
Albumin: 4.4 g/dL (ref 3.5–5.5)
Alkaline Phosphatase: 77 IU/L (ref 39–117)
BUN/Creatinine Ratio: 16 (ref 9–23)
BUN: 12 mg/dL (ref 6–24)
Bilirubin Total: 0.2 mg/dL (ref 0.0–1.2)
CO2: 24 mmol/L (ref 20–29)
Calcium: 9.6 mg/dL (ref 8.7–10.2)
Chloride: 102 mmol/L (ref 96–106)
Creatinine, Ser: 0.74 mg/dL (ref 0.57–1.00)
GFR calc Af Amer: 103 mL/min/{1.73_m2} (ref >59)
GFR calc non Af Amer: 89 mL/min/{1.73_m2} (ref >59)
Globulin, Total: 3 g/dL (ref 1.5–4.5)
Glucose: 105 mg/dL — ABNORMAL HIGH (ref 65–99)
Potassium: 4.3 mmol/L (ref 3.5–5.2)
Sodium: 141 mmol/L (ref 134–144)
Total Protein: 7.4 g/dL (ref 6.0–8.5)

## 2017-10-25 LAB — CBC WITH DIFFERENTIAL/PLATELET
Basophils Absolute: 0 10*3/uL (ref 0.0–0.2)
Basos: 1 %
EOS (ABSOLUTE): 0.1 10*3/uL (ref 0.0–0.4)
Eos: 2 %
Hematocrit: 35.8 % (ref 34.0–46.6)
Hemoglobin: 11.8 g/dL (ref 11.1–15.9)
Immature Grans (Abs): 0 10*3/uL (ref 0.0–0.1)
Immature Granulocytes: 0 %
Lymphocytes Absolute: 2 10*3/uL (ref 0.7–3.1)
Lymphs: 50 %
MCH: 28.6 pg (ref 26.6–33.0)
MCHC: 33 g/dL (ref 31.5–35.7)
MCV: 87 fL (ref 79–97)
Monocytes Absolute: 0.3 10*3/uL (ref 0.1–0.9)
Monocytes: 7 %
Neutrophils Absolute: 1.6 10*3/uL (ref 1.4–7.0)
Neutrophils: 40 %
Platelets: 316 10*3/uL (ref 150–450)
RBC: 4.13 x10E6/uL (ref 3.77–5.28)
RDW: 14.3 % (ref 12.3–15.4)
WBC: 3.9 10*3/uL (ref 3.4–10.8)

## 2017-10-25 LAB — URINALYSIS, DIPSTICK ONLY
Bilirubin, UA: NEGATIVE
Glucose, UA: NEGATIVE
Ketones, UA: NEGATIVE
Leukocytes, UA: NEGATIVE
Nitrite, UA: NEGATIVE
Protein, UA: NEGATIVE
RBC, UA: NEGATIVE
Specific Gravity, UA: 1.022 (ref 1.005–1.030)
Urobilinogen, Ur: 0.2 mg/dL (ref 0.2–1.0)
pH, UA: 6.5 (ref 5.0–7.5)

## 2017-10-25 LAB — LIPID PANEL
Chol/HDL Ratio: 3 ratio (ref 0.0–4.4)
Cholesterol, Total: 210 mg/dL — ABNORMAL HIGH (ref 100–199)
HDL: 70 mg/dL (ref >39)
LDL Calculated: 128 mg/dL — ABNORMAL HIGH (ref 0–99)
Triglycerides: 62 mg/dL (ref 0–149)
VLDL Cholesterol Cal: 12 mg/dL (ref 5–40)

## 2017-10-25 LAB — TSH: TSH: 1.88 u[IU]/mL (ref 0.450–4.500)

## 2017-10-25 LAB — HEPATITIS C ANTIBODY: Hep C Virus Ab: <0.1 s/co ratio (ref 0.0–0.9)

## 2017-10-25 LAB — HIV ANTIBODY (ROUTINE TESTING W REFLEX): HIV Screen 4th Generation wRfx: NONREACTIVE

## 2017-10-27 ENCOUNTER — Encounter: Payer: Self-pay | Admitting: *Deleted

## 2017-11-21 ENCOUNTER — Ambulatory Visit
Admission: RE | Admit: 2017-11-21 | Discharge: 2017-11-21 | Disposition: A | Discharge status: Home / Self Care | Payer: BLUE CROSS/BLUE SHIELD | Source: Ambulatory Visit | Attending: Physician Assistant | Admitting: Physician Assistant

## 2017-11-21 DIAGNOSIS — Z1231 Encounter for screening mammogram for malignant neoplasm of breast: Secondary | ICD-10-CM

## 2017-12-23 ENCOUNTER — Ambulatory Visit (AMBULATORY_SURGERY_CENTER): Payer: Self-pay

## 2017-12-23 VITALS — Ht 65.0 in | Wt 177.2 lb

## 2017-12-23 DIAGNOSIS — Z1211 Encounter for screening for malignant neoplasm of colon: Secondary | ICD-10-CM

## 2017-12-23 NOTE — Progress Notes (Signed)
Denies allergies to eggs or soy products. Denies complication of anesthesia or sedation. Denies use of weight loss medication. Denies use of O2.   Emmi instructions declined.  

## 2017-12-30 ENCOUNTER — Encounter: Payer: Self-pay | Admitting: Internal Medicine

## 2018-01-06 ENCOUNTER — Ambulatory Visit (AMBULATORY_SURGERY_CENTER): Payer: BLUE CROSS/BLUE SHIELD | Admitting: Internal Medicine

## 2018-01-06 ENCOUNTER — Encounter: Payer: Self-pay | Admitting: Internal Medicine

## 2018-01-06 VITALS — BP 109/70 | HR 54 | Temp 98.9°F | Resp 17 | Ht 65.0 in | Wt 171.0 lb

## 2018-01-06 DIAGNOSIS — K633 Ulcer of intestine: Secondary | ICD-10-CM | POA: Diagnosis not present

## 2018-01-06 DIAGNOSIS — Z1211 Encounter for screening for malignant neoplasm of colon: Secondary | ICD-10-CM

## 2018-01-06 DIAGNOSIS — K529 Noninfective gastroenteritis and colitis, unspecified: Secondary | ICD-10-CM | POA: Diagnosis not present

## 2018-01-06 MED ORDER — SODIUM CHLORIDE 0.9 % IV SOLN: 500.0000 mL | Freq: Once | INTRAVENOUS | Status: DC

## 2018-01-06 NOTE — Progress Notes (Signed)
Pt's states no medical or surgical changes since previsit or office visit. 

## 2018-01-06 NOTE — Patient Instructions (Addendum)
No polyps or cancer. You do have a condition called diverticulosis - common and not usually a problem. Please read the handout provided.  I did see some patchy mild inflammation and took biopsies - sometimes that is from the cleanout but I wanted to check and see if it could be the start of ulcerative colitis.  I will let you know.  I appreciate the opportunity to care for you. Gatha Mayer, MD, FACG  YOU HAD AN ENDOSCOPIC PROCEDURE TODAY AT [facility]:   Refer to the procedure report that was given to you for any specific questions about what was found during the examination.  If the procedure report does not answer your questions, please call your gastroenterologist to clarify.  If you requested that your care partner not be given the details of your procedure findings, then the procedure report has been included in a sealed envelope for you to review at your convenience later.  YOU SHOULD EXPECT: Some feelings of bloating in the abdomen. Passage of more gas than usual.  Walking can help get rid of the air that was put into your GI tract during the procedure and reduce the bloating. If you had a lower endoscopy (such as a colonoscopy or flexible sigmoidoscopy) you may notice spotting of blood in your stool or on the toilet paper. If you underwent a bowel prep for your procedure, you may not have a normal bowel movement for a few days.  Please Note:  You might notice some irritation and congestion in your nose or some drainage.  This is from the oxygen used during your procedure.  There is no need for concern and it should clear up in a day or so.  SYMPTOMS TO REPORT IMMEDIATELY:   Following lower endoscopy (colonoscopy or flexible sigmoidoscopy):  Excessive amounts of blood in the stool  Significant tenderness or worsening of abdominal pains  Swelling of the abdomen that is new, acute  Fever of 100F or higher   For urgent or emergent issues, a  gastroenterologist can be reached at any hour by calling [phone].   DIET:  We do recommend a small meal at first, but then you may proceed to your regular diet.  Drink plenty of fluids but you should avoid alcoholic beverages for 24 hours.  ACTIVITY:  You should plan to take it easy for the rest of today and you should NOT DRIVE or use heavy machinery until tomorrow (because of the sedation medicines used during the test).    FOLLOW UP: Our staff will call the number listed on your records the next business day following your procedure to check on you and address any questions or concerns that you may have regarding the information given to you following your procedure. If we do not reach you, we will leave a message.  However, if you are feeling well and you are not experiencing any problems, there is no need to return our call.  We will assume that you have returned to your regular daily activities without incident.  If any biopsies were taken you will be contacted by phone or by letter within the next 1-3 weeks.  Please call us at [phone] if you have not heard about the biopsies in 3 weeks.    SIGNATURES/CONFIDENTIALITY: You and/or your care partner have signed paperwork which will be entered into your electronic medical record.  These signatures attest to the fact that that the information above on your After Visit  Summary has been reviewed and is understood.  Full responsibility of the confidentiality of this discharge information lies with you and/or your care-partner.

## 2018-01-06 NOTE — Progress Notes (Signed)
Spontaneous respirations throughout. VSS. Resting comfortably. To PACU on room air. Report to  RN. 

## 2018-01-06 NOTE — Progress Notes (Signed)
Called to room to assist during endoscopic procedure.  Patient ID and intended procedure confirmed with present staff. Received instructions for my participation in the procedure from the performing physician.  

## 2018-01-06 NOTE — Op Note (Signed)
[place] Patient Name: Leah Bailey Procedure Date: 01/06/2018 11:04 AM MRN: [id number] Endoscopist: Gatha Mayer , MD Age: 62 Referring MD:  Date of Birth: Sep 24, 1955 Gender: Female Account #: [id number] Procedure:                Colonoscopy Indications:              Screening for colorectal malignant neoplasm Medicines:                Propofol per Anesthesia, Monitored Anesthesia Care Procedure:                Pre-Anesthesia Assessment:                           - Prior to the procedure, a History and Physical                            was performed, and patient medications and                            allergies were reviewed. The patient's tolerance of                            previous anesthesia was also reviewed. The risks                            and benefits of the procedure and the sedation                            options and risks were discussed with the patient.                            All questions were answered, and informed consent                            was obtained. Prior Anticoagulants: The patient has                            taken no previous anticoagulant or antiplatelet                            agents. ASA Grade Assessment: II - A patient with                            mild systemic disease. After reviewing the risks                            and benefits, the patient was deemed in                            satisfactory condition to undergo the procedure.                           After obtaining informed consent, the colonoscope  was passed under direct vision. Throughout the                            procedure, the patient's blood pressure, pulse, and                            oxygen saturations were monitored continuously. The                            Model PCF-[id number]) scope was introduced                            through the anus and advanced to the the terminal                   ileum, with identification of the appendiceal                            orifice and IC valve. The colonoscopy was performed                            without difficulty. The patient tolerated the                            procedure well. The quality of the bowel                            preparation was good. The terminal ileum, ileocecal                            valve, appendiceal orifice, and rectum were                            photographed. The bowel preparation used was                            Miralax. Scope In: 11:28:22 AM Scope Out: 11:43:57 AM Scope Withdrawal Time: 0 hours 11 minutes 25 seconds  Total Procedure Duration: 0 hours 15 minutes 35 seconds  Findings:                 The perianal and digital rectal examinations were                            normal.                           A patchy area of mildly ulcerated mucosa was found                            in the entire colon. Biopsies were taken with a                            cold forceps for histology. Verification of patient  identification for the specimen was done. Estimated                            blood loss was minimal.                           Multiple diverticula were found in the sigmoid                            colon.                           The exam was otherwise without abnormality on                            direct and retroflexion views.                           The terminal ileum appeared normal. Complications:            No immediate complications. Estimated Blood Loss:     Estimated blood loss was minimal. Impression:               - Ulcerated mucosa in the entire examined colon.                            Biopsied.                           - Diverticulosis in the sigmoid colon.                           - The examination was otherwise normal on direct                            and retroflexion views.                           - The  examined portion of the ileum was normal. Recommendation:           - Patient has a contact number available for                            emergencies. The signs and symptoms of potential                            delayed complications were discussed with the                            patient. Return to normal activities tomorrow.                            Written discharge instructions were provided to the                            patient.                           -  Resume previous diet.                           - Continue present medications.                           - Await pathology results.                           - Repeat colonoscopy is recommended. The                            colonoscopy date will be determined after pathology                            results from today's exam become available for                            review.                           cc: Daphane Shepherd, PA-C Gatha Mayer, MD 01/06/2018 11:51:10 AM This report has been signed electronically.

## 2018-01-07 ENCOUNTER — Telehealth: Payer: Self-pay

## 2018-01-07 ENCOUNTER — Telehealth: Payer: Self-pay | Admitting: *Deleted

## 2018-01-07 NOTE — Telephone Encounter (Signed)
Left message on f/u call 

## 2018-01-07 NOTE — Telephone Encounter (Signed)
Second follow up phone call attempt, no answer, message left

## 2018-01-11 ENCOUNTER — Encounter: Payer: Self-pay | Admitting: Internal Medicine

## 2018-01-11 ENCOUNTER — Other Ambulatory Visit: Payer: Self-pay | Admitting: Internal Medicine

## 2018-01-11 DIAGNOSIS — K529 Noninfective gastroenteritis and colitis, unspecified: Secondary | ICD-10-CM

## 2018-01-11 HISTORY — DX: Noninfective gastroenteritis and colitis, unspecified: K52.9

## 2018-01-11 MED ORDER — MESALAMINE 1.2 G PO TBEC: 2.4000 g | DELAYED_RELEASE_TABLET | Freq: Every day | ORAL | 3 refills | Status: DC

## 2018-01-11 NOTE — Progress Notes (Signed)
Biopsies indicate she has IBD - either Crohn's or UC - this was an incidental finding at a screening colonoscopy I do not think she is having diarrhea problems but it could start to be a problem so I do recommend treatment with medication and I rxed Lialda 1.2 g take 2 daily - please explain   IF this is too costly she should not get it ane let me know  As long as she is feeling ok I want her to see me next available and we will discuss more  Let me know if any ? Now  Needs 10 yr colon recall No letter

## 2018-01-12 ENCOUNTER — Other Ambulatory Visit: Payer: Self-pay

## 2018-03-09 ENCOUNTER — Ambulatory Visit: Payer: BLUE CROSS/BLUE SHIELD | Admitting: Internal Medicine

## 2018-03-09 ENCOUNTER — Encounter: Payer: Self-pay | Admitting: Internal Medicine

## 2018-03-09 DIAGNOSIS — K529 Noninfective gastroenteritis and colitis, unspecified: Secondary | ICD-10-CM

## 2018-03-09 NOTE — Progress Notes (Signed)
   Kinslea Benavidez 62 y.o. [birth year] [id number]  Assessment & Plan:   IBD (inflammatory bowel disease) - colitis Doing well Asx RTC 1 year Ask PCP about vaccines   I appreciate the opportunity to care for this patient. CC: Harrison Mons, PA    Subjective:   Chief Complaint: Inflammatory bowel disease, colitis  HPI The patient is here, she had a screening colonoscopy in August and there was patchy inflammatory change and biopsies demonstrated chronic active colitis consistent with inflammatory bowel disease.  She was asymptomatic.  Because of these changes I put her on Lialda 2.4 g daily and she is tolerated that fine.  She is here to discuss her situation which we did. No Known Allergies Current Meds  Medication Sig  . aspirin 81 MG tablet Take 81 mg by mouth daily.  . ferrous sulfate 325 (65 FE) MG EC tablet Take 325 mg by mouth daily with breakfast.  . lisinopril-hydrochlorothiazide (PRINZIDE,ZESTORETIC) 20-25 MG tablet Take 1 tablet by mouth daily.  . mesalamine (LIALDA) 1.2 g EC tablet Take 2 tablets (2.4 g total) by mouth daily with breakfast.  . Multiple Vitamins-Calcium (ONE-A-DAY WOMENS PO) Take 1 tablet by mouth daily.  Marland Kitchen OVER THE COUNTER MEDICATION Calcium 600 mg one time daily.  Marland Kitchen OVER THE COUNTER MEDICATION B 12 One tablet daily.  . [DISCONTINUED] polyethylene glycol powder (GLYCOLAX/MIRALAX) powder Take 1 Container by mouth once.   Past Medical History:  Diagnosis Date  . Herpes simplex disciform keratitis 10/20/2013  . Hypertension   . IBD (inflammatory bowel disease) - colitis 01/11/2018  . Nuclear sclerosis of both eyes 11/30/2014   Past Surgical History:  Procedure Laterality Date  . ABDOMINAL HYSTERECTOMY    . BREAST CYST ASPIRATION Left 1983  . COLONOSCOPY    . TUBAL LIGATION     Social History   Social History Narrative   Lives with her husband.  She is a Quarry manager at [employer]   Their adult sons live nearby.  2 sons   No alcohol tobacco or  drug use   family history includes Breast cancer in her cousin; Breast cancer (age of onset: 17) in her mother; Diabetes in her brother, brother, mother, sister, and sister; Healthy in her son and son; Heart attack in her father; Heart disease in her father.   Review of Systems As above  Objective:   Physical Exam BP 100/64 (BP Location: Left Arm, Patient Position: Sitting, Cuff Size: Normal)   Pulse 80   Ht 5' (1.524 m) Comment: height measured without shoes  Wt 178 lb 4 oz (80.9 kg)   BMI 34.81 kg/m  The patient is well-developed well-nourished in no acute distress

## 2018-03-09 NOTE — Assessment & Plan Note (Addendum)
Doing well Asx RTC 1 year Ask PCP about vaccines

## 2018-03-09 NOTE — Patient Instructions (Signed)
Please follow up with Dr Carlean Purl in 1 year.  Please talk to your Primary Care Physician about Pneumonia Vaccine.  Please call us back when you get your Flu vaccine so we can document it.  I appreciate the opportunity to care for you. Silvano Rusk, MD, [facility]

## 2018-04-06 ENCOUNTER — Other Ambulatory Visit: Payer: Self-pay | Admitting: Internal Medicine

## 2018-04-27 ENCOUNTER — Ambulatory Visit: Payer: Self-pay | Admitting: Family Medicine

## 2018-09-28 ENCOUNTER — Encounter: Payer: Self-pay | Admitting: Internal Medicine

## 2018-09-28 ENCOUNTER — Ambulatory Visit (INDEPENDENT_AMBULATORY_CARE_PROVIDER_SITE_OTHER): Payer: BLUE CROSS/BLUE SHIELD | Admitting: Internal Medicine

## 2018-09-28 ENCOUNTER — Other Ambulatory Visit: Payer: Self-pay

## 2018-09-28 ENCOUNTER — Ambulatory Visit: Payer: BLUE CROSS/BLUE SHIELD | Admitting: Internal Medicine

## 2018-09-28 VITALS — Ht 65.0 in | Wt 158.0 lb

## 2018-09-28 DIAGNOSIS — K5900 Constipation, unspecified: Secondary | ICD-10-CM

## 2018-09-28 DIAGNOSIS — K625 Hemorrhage of anus and rectum: Secondary | ICD-10-CM | POA: Diagnosis not present

## 2018-09-28 DIAGNOSIS — R710 Precipitous drop in hematocrit: Secondary | ICD-10-CM

## 2018-09-28 DIAGNOSIS — K529 Noninfective gastroenteritis and colitis, unspecified: Secondary | ICD-10-CM | POA: Diagnosis not present

## 2018-09-28 NOTE — Assessment & Plan Note (Addendum)
?   Flare - think maybe not and more likely had constipation from high-protein diet and this caused her problems.  hgb was a hare low at 10.8.  She is better now  So:  Continue mesalamine Add psyllium 1 tbsp/day Stay on only one ferrous sulfate daily [facility] w/ Daphane Shepherd soon  Get ferritin and CBC with Chelle when she sees her Call/message see me if more problems like this - Since had colonoscopy 12/2017 do not think need to repeat endoscopic evaluation - if she has recurrent bleeding DRE and anoscopy would be useful and require in person visit  She has had some back pain recently, related to work, and we discussed how traditional NSAIDs are not best for patients with chronic inflammatory bowel disease like her though a very brief course of ibuprofen or Aleve etc. is unlikely to trigger something.  Celecoxib is the preferred nonsteroidal anti-inflammatory medication in patients with ulcerative colitis or Crohn's disease.

## 2018-09-28 NOTE — Progress Notes (Signed)
TELEHEALTH ENCOUNTER IN SETTING OF COVID-19 PANDEMIC - REQUESTED BY PATIENT SERVICE PROVIDED BY TELEMEDECINE - TYPE: Telephone, PATIENT LOCATION: Home PATIENT HAS CONSENTED TO TELEHEALTH VISIT PROVIDER LOCATION: OFFICE REFERRING PROVIDER:Jeffery, Chelle, PA  PARTICIPANTS OTHER THAN PATIENT:None TIME SPENT ON CALL:15 mins   Leah Bailey 63 y.o. Jan 24, 1956 [id number]  Assessment & Plan:   Encounter Diagnoses  Name Primary?  . IBD (inflammatory bowel disease) - colitis   . Rectal bleeding Yes  . Constipation, unspecified constipation type   . Decreased hemoglobin 10.8     IBD (inflammatory bowel disease) - colitis ? Flare - think maybe not and more likely had constipation from high-protein diet and this caused her problems.  hgb was a hare low at 10.8.  She is better now  So:  Continue mesalamine Add psyllium 1 tbsp/day Stay on only one ferrous sulfate daily [facility] visit w/ Daphane Shepherd soon  Get ferritin and CBC with Chelle when she sees her Call/message see me if more problems like this - Since had colonoscopy 12/2017 do not think need to repeat endoscopic evaluation - if she has recurrent bleeding DRE and anoscopy would be useful and require in person visit      QZ:[id number], Chelle, PA   Subjective:   Chief Complaint:  HPI Leah Bailey is a patient with incidentally discovered inflammatory bowel disease on screening colonoscopy in August 2019, who was treated with mesalamine 2.4 g daily.  She had been well, I am not sure if she has Crohn's disease or ulcerative colitis, the inflammation was patchy so Crohn's colitis is favored but does not really change treatment.  Earlier this year she started a high-protein diet and she has been maintained on ferrous sulfate chronically because "I have always had an anemia" though her hemoglobin was 11.8 last year.  She became more constipated and had crampy abdominal pain and some rectal  bleeding, the blood was medium red apparently but there were normal stools with that though she was straining and struggling to defecate.  This was about 2 weeks ago and she liberalized her diet a little bit and dropped down to 1 ferrous sulfate a day after holding it temporarily and she feels like she is back to normal or almost back to normal.  She is planning to have a visit with her primary care provider, she will Lurlean Horns in the very near future.  She had some blood work done at the clinic at [employer] where she works and she said a hemoglobin was 10.65-monthago.  Review of systems notable for a recent back strain related to work.  She said they avoided putting her on nonsteroidal i anti-inflammatory agents.  She is improved.  As per HPI  She is intentionally losing weight and is congratulated. Wt Readings from Last 3 Encounters:  09/28/18 158 lb (71.7 kg)  03/09/18 178 lb 4 oz (80.9 kg)  01/06/18 171 lb (77.6 kg)    No Known Allergies Current Meds  Medication Sig  . aspirin 81 MG tablet Take 81 mg by mouth daily.  . ferrous sulfate 325 (65 FE) MG EC tablet Take 325 mg by mouth daily with breakfast.  . lisinopril-hydrochlorothiazide (PRINZIDE,ZESTORETIC) 20-25 MG tablet Take 1 tablet by mouth daily.  . mesalamine (LIALDA) 1.2 g EC tablet TAKE 2 TABLETS (2.4 G TOTAL) BY MOUTH DAILY WITH BREAKFAST.  . Multiple Vitamins-Calcium (ONE-A-DAY WOMENS PO) Take 1 tablet by mouth daily.  .Marland KitchenOVER THE COUNTER MEDICATION Calcium 600 mg  one time daily.   Past Medical History:  Diagnosis Date  . Herpes simplex disciform keratitis 10/20/2013  . Hypertension   . IBD (inflammatory bowel disease) - colitis 01/11/2018  . Nuclear sclerosis of both eyes 11/30/2014  . UC (ulcerative colitis) [facility])    Past Surgical History:  Procedure Laterality Date  . ABDOMINAL HYSTERECTOMY    . BREAST CYST ASPIRATION Left 1983  . COLONOSCOPY    . TUBAL LIGATION     Social History   Social History Narrative    Lives with her husband.  She is a Quarry manager at [employer]   Their adult sons live nearby.  2 sons   No alcohol tobacco or drug use   family history includes Breast cancer in her cousin; Breast cancer (age of onset: 52) in her mother; Diabetes in her brother, brother, mother, sister, and sister; Healthy in her son and son; Heart attack in her father; Heart disease in her father.   Review of Systems As per HPI.  Otherwise negative.

## 2018-09-28 NOTE — Patient Instructions (Addendum)
It was good to speak to you today, I am sorry you were having some problems recently.  As we discussed, I am not sure this was a flare of your ulcerative colitis but more likely constipation from the diet changes and continued iron supplementation.  I am glad you are feeling better.   Be sure to have that annual visit with Daphane Shepherd PA-C, and take the labs you had done at the [facility] to her for review.  If you have more problems like we talked about today contact me as I think you will need an in person visit for evaluation.  As we also discussed, in general it is best to avoid anti-inflammatory medications like ibuprofen and Aleve or Advil etc. in patients with inflammatory bowel disease.  However a very limited treatment course of those is unlikely to cause a flare of your colitis.  In a perfect world avoiding those is clearly ideal.  Celebrex or celecoxib is the preferred anti-inflammatory if you need to take 1 but that can sometimes be expensive.  At any rate should you need to take something you can always ask to have that prescribed.  Acetaminophen is safe in your situation.  You should have a routine follow-up with me in 1 year, but again, sooner if these type of problems recur.  I appreciate the opportunity to care for you.  Gatha Mayer, MD, [facility]

## 2018-11-17 ENCOUNTER — Telehealth: Payer: Self-pay | Admitting: Internal Medicine

## 2018-11-17 ENCOUNTER — Other Ambulatory Visit: Payer: Self-pay

## 2018-11-17 ENCOUNTER — Emergency Department (HOSPITAL_COMMUNITY)
Admission: EM | Admit: 2018-11-17 | Discharge: 2018-11-17 | Disposition: A | Discharge status: Home / Self Care | Payer: BC Managed Care – PPO | Attending: Emergency Medicine | Admitting: Emergency Medicine

## 2018-11-17 ENCOUNTER — Emergency Department (HOSPITAL_COMMUNITY): Payer: BC Managed Care – PPO

## 2018-11-17 ENCOUNTER — Encounter (HOSPITAL_COMMUNITY): Payer: Self-pay

## 2018-11-17 DIAGNOSIS — I10 Essential (primary) hypertension: Secondary | ICD-10-CM | POA: Insufficient documentation

## 2018-11-17 DIAGNOSIS — Z7982 Long term (current) use of aspirin: Secondary | ICD-10-CM | POA: Insufficient documentation

## 2018-11-17 DIAGNOSIS — R1032 Left lower quadrant pain: Secondary | ICD-10-CM | POA: Diagnosis present

## 2018-11-17 DIAGNOSIS — Z79899 Other long term (current) drug therapy: Secondary | ICD-10-CM | POA: Diagnosis not present

## 2018-11-17 DIAGNOSIS — K648 Other hemorrhoids: Secondary | ICD-10-CM | POA: Diagnosis not present

## 2018-11-17 DIAGNOSIS — Z90710 Acquired absence of both cervix and uterus: Secondary | ICD-10-CM | POA: Diagnosis not present

## 2018-11-17 LAB — CBC WITH DIFFERENTIAL/PLATELET
Abs Immature Granulocytes: 0.01 10*3/uL (ref 0.00–0.07)
Basophils Absolute: 0 10*3/uL (ref 0.0–0.1)
Basophils Relative: 1 %
Eosinophils Absolute: 0.1 10*3/uL (ref 0.0–0.5)
Eosinophils Relative: 1 %
HCT: 34.9 % — ABNORMAL LOW (ref 36.0–46.0)
Hemoglobin: 11.3 g/dL — ABNORMAL LOW (ref 12.0–15.0)
Immature Granulocytes: 0 %
Lymphocytes Relative: 32 %
Lymphs Abs: 1.6 10*3/uL (ref 0.7–4.0)
MCH: 29 pg (ref 26.0–34.0)
MCHC: 32.4 g/dL (ref 30.0–36.0)
MCV: 89.5 fL (ref 80.0–100.0)
Monocytes Absolute: 0.4 10*3/uL (ref 0.1–1.0)
Monocytes Relative: 8 %
Neutro Abs: 3 10*3/uL (ref 1.7–7.7)
Neutrophils Relative %: 58 %
Platelets: 339 10*3/uL (ref 150–400)
RBC: 3.9 MIL/uL (ref 3.87–5.11)
RDW: 12.7 % (ref 11.5–15.5)
WBC: 5.2 10*3/uL (ref 4.0–10.5)
nRBC: 0 % (ref 0.0–0.2)

## 2018-11-17 LAB — URINALYSIS, ROUTINE W REFLEX MICROSCOPIC
Bilirubin Urine: NEGATIVE
Glucose, UA: NEGATIVE mg/dL
Hgb urine dipstick: NEGATIVE
Ketones, ur: NEGATIVE mg/dL
Leukocytes,Ua: NEGATIVE
Nitrite: NEGATIVE
Protein, ur: NEGATIVE mg/dL
Specific Gravity, Urine: 1.006 (ref 1.005–1.030)
pH: 6 (ref 5.0–8.0)

## 2018-11-17 LAB — COMPREHENSIVE METABOLIC PANEL
ALT: 17 U/L (ref 0–44)
AST: 17 U/L (ref 15–41)
Albumin: 4.2 g/dL (ref 3.5–5.0)
Alkaline Phosphatase: 61 U/L (ref 38–126)
Anion gap: 13 (ref 5–15)
BUN: 14 mg/dL (ref 6–20)
CO2: 24 mmol/L (ref 22–32)
Calcium: 9.7 mg/dL (ref 8.9–10.3)
Chloride: 97 mmol/L — ABNORMAL LOW (ref 98–111)
Creatinine, Ser: 0.88 mg/dL (ref 0.44–1.00)
GFR calc Af Amer: >60 mL/min (ref >60)
GFR calc non Af Amer: >60 mL/min (ref >60)
Glucose, Bld: 119 mg/dL — ABNORMAL HIGH (ref 70–99)
Potassium: 3.7 mmol/L (ref 3.5–5.1)
Sodium: 134 mmol/L — ABNORMAL LOW (ref 135–145)
Total Bilirubin: 0.3 mg/dL (ref 0.3–1.2)
Total Protein: 7.6 g/dL (ref 6.5–8.1)

## 2018-11-17 LAB — LIPASE, BLOOD: Lipase: 39 U/L (ref 11–51)

## 2018-11-17 LAB — POC OCCULT BLOOD, ED: Fecal Occult Bld: POSITIVE — AB

## 2018-11-17 MED ORDER — SODIUM CHLORIDE (PF) 0.9 % IJ SOLN
INTRAMUSCULAR | Status: AC
  Administered 2018-11-17: 12:00:00
  Filled 2018-11-17: qty 50

## 2018-11-17 MED ORDER — SODIUM CHLORIDE 0.9 % IV BOLUS
1000.0000 mL | Freq: Once | INTRAVENOUS | Status: AC
  Administered 2018-11-17: 1000 mL via INTRAVENOUS

## 2018-11-17 MED ORDER — HYDROCORTISONE ACETATE 25 MG RE SUPP: 25.0000 mg | Freq: Two times a day (BID) | RECTAL | 0 refills | Status: DC

## 2018-11-17 MED ORDER — IOHEXOL 300 MG/ML  SOLN
100.0000 mL | Freq: Once | INTRAMUSCULAR | Status: AC | PRN
  Administered 2018-11-17: 100 mL via INTRAVENOUS

## 2018-11-17 NOTE — ED Triage Notes (Signed)
Pt has hx of diverticulosis, arrives today w/ c/o severe LLQ pain, n/v, and near syncope today.

## 2018-11-17 NOTE — Telephone Encounter (Signed)
Left message for patient to call back  

## 2018-11-17 NOTE — ED Notes (Signed)
Patient transported to CT 

## 2018-11-17 NOTE — Discharge Instructions (Signed)
Please use Anusol as needed for rectal comfort.  Call and follow up with your GI specialist DR. Carlean Purl for further care.  Return if you have any concerns.

## 2018-11-17 NOTE — ED Notes (Signed)
Pt is aware of need for urine specimen.

## 2018-11-17 NOTE — Telephone Encounter (Signed)
Pt reported that she has diverticulosis and is in pain.  Please advise.

## 2018-11-17 NOTE — ED Provider Notes (Signed)
[facility] DEPT Provider Note   CSN: [id number] Arrival date & time: 11/17/18  0930     History   Chief Complaint No chief complaint on file.   HPI Leah Bailey is a 63 y.o. female.     The history is provided by the patient and medical records. No language interpreter was used.  Abdominal Pain    63 year old female with history of diverticulosis presenting for evaluation of abdominal pain.  Patient report for the past 2 weeks she has had intermittent pain to her lower abdomen.  Pain is primarily to the left lower quadrant, described as a dull sensation, waxing and waning but more intense this morning.  States that she has been trying to monitor her diet including having liquid diet for the past several days.  Today while at work, she ate watermelon and subsequently felt lightheadedness, sweaty, with intense abdominal pain prompting her to visit to the ED.  She did report increased bowel movement and noticed trace of bright red blood per rectum recently.  She acknowledges she has history of hemorrhoids.  She denies having fever chills no chest pain shortness of breath productive cough dysuria vaginal bleeding or vaginal discharge.  She denies ever being diagnosed with ulcerative colitis.  She mention she was found to have diverticular disease on a routine colonoscopy.  She denies alcohol abuse.  She denies any injury.      Past Medical History:  Diagnosis Date  . Herpes simplex disciform keratitis 10/20/2013  . Hypertension   . IBD (inflammatory bowel disease) - colitis 01/11/2018  . Nuclear sclerosis of both eyes 11/30/2014  . UC (ulcerative colitis) [facility])     Patient Active Problem List   Diagnosis Date Noted  . IBD (inflammatory bowel disease) - colitis 01/11/2018  . Hyperlipidemia 10/25/2017  . Hyperglycemia 10/25/2017  . Essential hypertension, benign 10/24/2017    Past Surgical History:  Procedure Laterality Date  . ABDOMINAL  HYSTERECTOMY    . BREAST CYST ASPIRATION Left 1983  . COLONOSCOPY    . TUBAL LIGATION       OB History   No obstetric history on file.      Home Medications    Prior to Admission medications   Medication Sig Start Date End Date Taking? Authorizing Provider  aspirin 81 MG tablet Take 81 mg by mouth daily.    [provider]  ferrous sulfate 325 (65 FE) MG EC tablet Take 325 mg by mouth daily with breakfast.    [provider]  lisinopril-hydrochlorothiazide (PRINZIDE,ZESTORETIC) 20-25 MG tablet Take 1 tablet by mouth daily. 10/24/17   Harrison Mons, PA  mesalamine (LIALDA) 1.2 g EC tablet TAKE 2 TABLETS (2.4 G TOTAL) BY MOUTH DAILY WITH BREAKFAST. 04/06/18   Gatha Mayer, MD  Multiple Vitamins-Calcium (ONE-A-DAY WOMENS PO) Take 1 tablet by mouth daily.    [provider]  OVER THE COUNTER MEDICATION Calcium 600 mg one time daily.    [provider]    Family History Family History  Problem Relation Age of Onset  . Diabetes Mother   . Breast cancer Mother 2  . Heart attack Father   . Heart disease Father   . Diabetes Sister   . Diabetes Brother   . Diabetes Sister   . Healthy Son   . Healthy Son   . Diabetes Brother   . Breast cancer Cousin   . Colon cancer Neg Hx   . Esophageal cancer Neg Hx   .  Stomach cancer Neg Hx   . Rectal cancer Neg Hx     Social History Social History   Tobacco Use  . Smoking status: Never Smoker  . Smokeless tobacco: Never Used  Substance Use Topics  . Alcohol use: No  . Drug use: No     Allergies   Patient has no known allergies.   Review of Systems Review of Systems  Gastrointestinal: Positive for abdominal pain.  All other systems reviewed and are negative.    Physical Exam Updated Vital Signs There were no vitals taken for this visit.  Physical Exam Vitals signs and nursing note reviewed.  Constitutional:      General: She is not in acute distress.    Appearance: She is  well-developed.  HENT:     Head: Atraumatic.  Eyes:     Conjunctiva/sclera: Conjunctivae normal.  Neck:     Musculoskeletal: Neck supple.  Cardiovascular:     Rate and Rhythm: Normal rate and regular rhythm.     Heart sounds: Normal heart sounds.  Pulmonary:     Effort: Pulmonary effort is normal.     Breath sounds: Normal breath sounds.  Abdominal:     General: Abdomen is flat.     Palpations: Abdomen is soft.     Tenderness: There is abdominal tenderness (Mild left lower quadrant tenderness without guarding or rebound tenderness) in the left lower quadrant.  Genitourinary:    Comments: Chaperone present during exam.  No obvious thrombosed hemorrhoid on visual examination.  Discomfort with digital rectal exam with evidence of internal hemorrhoid.  Blood noted on glove.  No obvious mass, no stool impaction. Skin:    Findings: No rash.  Neurological:     Mental Status: She is alert.      ED Treatments / Results  Labs (all labs ordered are listed, but only abnormal results are displayed) Labs Reviewed  CBC WITH DIFFERENTIAL/PLATELET - Abnormal; Notable for the following components:      Result Value   Hemoglobin 11.3 (*)    HCT 34.9 (*)    All other components within normal limits  COMPREHENSIVE METABOLIC PANEL - Abnormal; Notable for the following components:   Sodium 134 (*)    Chloride 97 (*)    Glucose, Bld 119 (*)    All other components within normal limits  POC OCCULT BLOOD, ED - Abnormal; Notable for the following components:   Fecal Occult Bld POSITIVE (*)    All other components within normal limits  LIPASE, BLOOD  URINALYSIS, ROUTINE W REFLEX MICROSCOPIC    EKG None  Radiology Ct Abdomen Pelvis W Contrast  Result Date: 11/17/2018 CLINICAL DATA:  Left lower quadrant pain EXAM: CT ABDOMEN AND PELVIS WITH CONTRAST TECHNIQUE: Multidetector CT imaging of the abdomen and pelvis was performed using the standard protocol following bolus administration of  intravenous contrast. CONTRAST:  159m OMNIPAQUE IOHEXOL 300 MG/ML  SOLN COMPARISON:  None. FINDINGS: Lower chest: No acute abnormality. Hepatobiliary: No focal liver abnormality is seen. No gallstones, gallbladder wall thickening, or biliary dilatation. Pancreas: Unremarkable. No pancreatic ductal dilatation or surrounding inflammatory changes. Spleen: Within normal limits. Adrenals/Urinary Tract: Adrenal glands are within normal limits. Kidneys are well visualized bilaterally. No renal calculi or obstructive changes are noted. The bladder is well distended. Stomach/Bowel: The appendix is well visualized and within normal limits. Scattered mild diverticulosis is noted. No findings to suggest diverticulitis are seen. Small bowel and stomach are within normal limits. Vascular/Lymphatic: No significant vascular findings are present. No  enlarged abdominal or pelvic lymph nodes. Reproductive: Status post hysterectomy. No adnexal masses. Other: No abdominal wall hernia or abnormality. No abdominopelvic ascites. Musculoskeletal: No acute or significant osseous findings. IMPRESSION: Diverticulosis without diverticulitis. No acute abnormality noted. Electronically Signed   By: Inez Catalina M.D.   On: 11/17/2018 12:00    Procedures Procedures (including critical care time)  Medications Ordered in ED Medications  sodium chloride 0.9 % bolus 1,000 mL (0 mLs Intravenous Stopped 11/17/18 1148)  sodium chloride (PF) 0.9 % injection (  Contrast Given 11/17/18 1148)  iohexol (OMNIPAQUE) 300 MG/ML solution 100 mL (100 mLs Intravenous Contrast Given 11/17/18 1152)     Initial Impression / Assessment and Plan / ED Course  I have reviewed the triage vital signs and the nursing notes.  Pertinent labs & imaging results that were available during my care of the patient were reviewed by me and considered in my medical decision making (see chart for details).        BP 97/71   Pulse 60   Temp 98.8 F (37.1 C) (Oral)    Resp 18   Ht 5' 5"  (1.651 m)   Wt 68 kg   SpO2 100%   BMI 24.96 kg/m    Final Clinical Impressions(s) / ED Diagnoses   Final diagnoses:  LLQ pain  Internal hemorrhoid, bleeding    ED Discharge Orders         Ordered    hydrocortisone (ANUSOL-HC) 25 MG suppository  2 times daily     11/17/18 1247         10:22 AM Patient here for recurrent left lower quadrant abdominal pain.  No history of diverticular disease.  She will benefit from CT scan to assess for potential diverticulitis.  She does note some blood per rectum, does have history of hemorrhoidal.  She has some internal hemorrhoid on exam.  No evidence of anal fissure or perianal abscess.  Patient otherwise well-appearing.  12:38 PM Fecal occult blood test is positive.  Her hemoglobin is stable at 11.3.  Normal WBC of 5.2.  Electrolyte panels are reassuring, abdominal pelvis CT scan demonstrate diverticulosis without evidence of diverticulitis.  No other acute finding noted.  Since abdominal pain is minimal at this time and coupled with evidence of bleeding hemorrhoid, suspect her pain may be secondary to her hemorrhoid.  Will prescribe Anusol for comfort and encourage patient to follow-up outpatient for further evaluation of her GI bleed and abdominal pain.  Return precaution discussed.  At this time patient is stable for discharge.   Domenic Moras, PA-C 11/17/18 1247    Sherwood Gambler, MD 11/17/18 1318

## 2018-11-17 NOTE — ED Notes (Signed)
Pt returned from CT °

## 2018-11-17 NOTE — Telephone Encounter (Signed)
Patient called back and stated that she is at the ER she was just in too much pain.

## 2018-12-03 ENCOUNTER — Ambulatory Visit: Payer: BLUE CROSS/BLUE SHIELD | Admitting: Gastroenterology

## 2018-12-23 ENCOUNTER — Ambulatory Visit: Payer: BC Managed Care – PPO | Admitting: Nurse Practitioner

## 2019-01-05 ENCOUNTER — Other Ambulatory Visit: Payer: Self-pay | Admitting: Physician Assistant

## 2019-01-05 DIAGNOSIS — Z1231 Encounter for screening mammogram for malignant neoplasm of breast: Secondary | ICD-10-CM

## 2019-01-13 ENCOUNTER — Encounter: Payer: Self-pay | Admitting: Gastroenterology

## 2019-01-13 ENCOUNTER — Other Ambulatory Visit: Payer: Self-pay

## 2019-01-13 ENCOUNTER — Ambulatory Visit: Payer: BC Managed Care – PPO | Admitting: Gastroenterology

## 2019-01-13 VITALS — BP 120/60 | HR 81 | Temp 98.6°F | Ht 65.0 in | Wt 148.0 lb

## 2019-01-13 DIAGNOSIS — K6289 Other specified diseases of anus and rectum: Secondary | ICD-10-CM | POA: Diagnosis not present

## 2019-01-13 DIAGNOSIS — C210 Malignant neoplasm of anus, unspecified: Secondary | ICD-10-CM | POA: Insufficient documentation

## 2019-01-13 DIAGNOSIS — C20 Malignant neoplasm of rectum: Secondary | ICD-10-CM

## 2019-01-13 HISTORY — DX: Malignant neoplasm of rectum: C20

## 2019-01-13 MED ORDER — TRAMADOL HCL 50 MG PO TABS: 50.0000 mg | ORAL_TABLET | Freq: Three times a day (TID) | ORAL | 0 refills | Status: DC | PRN

## 2019-01-13 NOTE — Patient Instructions (Signed)
You have been scheduled for a colonoscopy. Please follow written instructions given to you at your visit today.  Please pick up your prep supplies at the pharmacy within the next 1-3 days. If you use inhalers (even only as needed), please bring them with you on the day of your procedure. Your physician has requested that you go to www.startemmi.com and enter the access code given to you at your visit today. This web site gives a general overview about your procedure. However, you should still follow specific instructions given to you by our office regarding your preparation for the procedure.  Please purchase the following medications over the counter and take as directed: Miralax

## 2019-01-13 NOTE — Progress Notes (Addendum)
01/13/2019 Leah Bailey [id number] 1955-07-24   HISTORY OF PRESENT ILLNESS: This is a 63 year old female is a patient of Dr. Celesta Aver.  She had a colonoscopy in August 2019 at which time she was found to have patchy colitis throughout the colon.  Biopsies of the left and right colon showed active colitis with mild chronic changes.  She has been taking Lialda for that.  She presents here today with complaints of rectal pain and some bleeding for about the past month and a half or so.  She says that the rectal pain is extremely uncomfortable.  She tried hydrocortisone and Preparation H for a while, which may have helped to some degree, but no longer seems to be helping.  Taking Aleve for pain.  She does see some bright red-colored blood.  She says that she has a sensation in her rectum that she needs to move her bowels, but only a little bit comes out.  She was also complaining of abdominal pain and cramping intermittently, but her PCP gave her some Bentyl to use, which she has been using on and off and that does seem to help.  The abdominal pain has improved.  She was in the emergency department on June 30 and had a CT scan of the abdomen pelvis with contrast.  This showed only diverticulosis.  She was Hemoccult positive.  Hemoglobin slightly low at 11.3 grams.   Past Medical History:  Diagnosis Date  . Herpes simplex disciform keratitis 10/20/2013  . Hypertension   . IBD (inflammatory bowel disease) - colitis 01/11/2018  . Nuclear sclerosis of both eyes 11/30/2014  . UC (ulcerative colitis) [facility])    Past Surgical History:  Procedure Laterality Date  . ABDOMINAL HYSTERECTOMY    . BREAST CYST ASPIRATION Left 1983  . COLONOSCOPY    . TUBAL LIGATION      reports that she is a non-smoker but has been exposed to tobacco smoke. She has never used smokeless tobacco. She reports that she does not drink alcohol or use drugs. family history includes Breast cancer in her cousin; Breast cancer  (age of onset: 83) in her mother; Diabetes in her brother, brother, mother, sister, and sister; Healthy in her son and son; Heart attack in her father; Heart disease in her father. No Known Allergies    Outpatient Encounter Medications as of 01/13/2019  Medication Sig  . aspirin 81 MG tablet Take 81 mg by mouth daily.  . ferrous sulfate 325 (65 FE) MG EC tablet Take 650 mg by mouth daily with breakfast.   . lisinopril-hydrochlorothiazide (PRINZIDE,ZESTORETIC) 20-25 MG tablet Take 1 tablet by mouth daily.  . mesalamine (LIALDA) 1.2 g EC tablet TAKE 2 TABLETS (2.4 G TOTAL) BY MOUTH DAILY WITH BREAKFAST.  . Multiple Vitamins-Calcium (ONE-A-DAY WOMENS PO) Take 1 tablet by mouth daily.  . naproxen sodium (ALEVE) 220 MG tablet Take 220 mg by mouth daily as needed.  Marland Kitchen OVER THE COUNTER MEDICATION Take 600 mg by mouth daily. Calcium  . [DISCONTINUED] hydrocortisone (ANUSOL-HC) 25 MG suppository Place 1 suppository (25 mg total) rectally 2 (two) times daily. For 7 days   No facility-administered encounter medications on file as of 01/13/2019.      REVIEW OF SYSTEMS  : All other systems reviewed and negative except where noted in the History of Present Illness.   PHYSICAL EXAM: BP 120/60   Pulse 81   Temp 98.6 F (37 C)   Ht 5' 5"  (1.651 m)   Wt 148  lb (67.1 kg)   BMI 24.63 kg/m  General: Well developed black female in no acute distress Head: Normocephalic and atraumatic Eyes:  Sclerae anicteric, conjunctiva pink. Ears: Normal auditory acuity Lungs: Clear throughout to auscultation; no increased WOB. Heart: Regular rate and rhythm; no M/R/G. Abdomen: Soft, non-distended.  BS present.  Non-tender. Rectal:  No external abnormalities noted.  DRE revealed a mass posteriorly, was hard and polypoid feeling.  Very painful on exam so no anoscopy attempted. Musculoskeletal: Symmetrical with no gross deformities  Skin: No lesions on visible extremities Extremities: No edema  Neurological: Alert  oriented x 4, grossly non-focal Psychological:  Alert and cooperative. Normal mood and affect  ASSESSMENT AND PLAN: *Rectal pain, bleeding:  Painful mass area felt posteriorly on exam today.  Anoscopy not attempted because of pain.  Needs colonoscopy, at least flexible sigmoidoscopy, but will not tolerate enema prep so will give regular bowel prep.  Schedule with Dr. Carlean Purl.  Patient given prescription for tramadol to try for pain.  **The risks, benefits, and alternatives to colonoscopy were discussed with the patient and she consents to proceed.   CC:  Harrison Mons, PA  Discussed w/ [name] Ordered pelvic CT and showed periorectal abscess and she had I and D 8/28. Colonoscopy cancelled  Gatha Mayer, MD, [facility]

## 2019-01-14 ENCOUNTER — Ambulatory Visit (HOSPITAL_COMMUNITY)
Admission: RE | Admit: 2019-01-14 | Discharge: 2019-01-14 | Disposition: A | Discharge status: Home / Self Care | Payer: BC Managed Care – PPO | Source: Ambulatory Visit | Attending: Internal Medicine | Admitting: Internal Medicine

## 2019-01-14 ENCOUNTER — Other Ambulatory Visit (INDEPENDENT_AMBULATORY_CARE_PROVIDER_SITE_OTHER): Payer: BC Managed Care – PPO

## 2019-01-14 ENCOUNTER — Telehealth: Payer: Self-pay

## 2019-01-14 DIAGNOSIS — K50919 Crohn's disease, unspecified, with unspecified complications: Secondary | ICD-10-CM

## 2019-01-14 DIAGNOSIS — K6289 Other specified diseases of anus and rectum: Secondary | ICD-10-CM | POA: Insufficient documentation

## 2019-01-14 DIAGNOSIS — K611 Rectal abscess: Secondary | ICD-10-CM | POA: Diagnosis not present

## 2019-01-14 DIAGNOSIS — K50914 Crohn's disease, unspecified, with abscess: Secondary | ICD-10-CM | POA: Diagnosis not present

## 2019-01-14 LAB — BASIC METABOLIC PANEL
BUN: 17 mg/dL (ref 6–23)
CO2: 28 mEq/L (ref 19–32)
Calcium: 10.4 mg/dL (ref 8.4–10.5)
Chloride: 95 mEq/L — ABNORMAL LOW (ref 96–112)
Creatinine, Ser: 0.71 mg/dL (ref 0.40–1.20)
GFR: 101.32 mL/min (ref >60.00)
Glucose, Bld: 109 mg/dL — ABNORMAL HIGH (ref 70–99)
Potassium: 4.2 mEq/L (ref 3.5–5.1)
Sodium: 129 mEq/L — ABNORMAL LOW (ref 135–145)

## 2019-01-14 LAB — CBC WITH DIFFERENTIAL/PLATELET
Basophils Absolute: 0 10*3/uL (ref 0.0–0.1)
Basophils Relative: 0.3 % (ref 0.0–3.0)
Eosinophils Absolute: 0 10*3/uL (ref 0.0–0.7)
Eosinophils Relative: 0.2 % (ref 0.0–5.0)
HCT: 28.1 % — ABNORMAL LOW (ref 36.0–46.0)
Hemoglobin: 9.3 g/dL — ABNORMAL LOW (ref 12.0–15.0)
Lymphocytes Relative: 12.9 % (ref 12.0–46.0)
Lymphs Abs: 1.6 10*3/uL (ref 0.7–4.0)
MCHC: 33 g/dL (ref 30.0–36.0)
MCV: 85.6 fl (ref 78.0–100.0)
Monocytes Absolute: 1.1 10*3/uL — ABNORMAL HIGH (ref 0.1–1.0)
Monocytes Relative: 8.8 % (ref 3.0–12.0)
Neutro Abs: 9.5 10*3/uL — ABNORMAL HIGH (ref 1.4–7.7)
Neutrophils Relative %: 77.8 % — ABNORMAL HIGH (ref 43.0–77.0)
Platelets: 350 10*3/uL (ref 150.0–400.0)
RBC: 3.28 Mil/uL — ABNORMAL LOW (ref 3.87–5.11)
RDW: 12.9 % (ref 11.5–15.5)
WBC: 12.2 10*3/uL — ABNORMAL HIGH (ref 4.0–10.5)

## 2019-01-14 MED ORDER — IOHEXOL 300 MG/ML  SOLN
100.0000 mL | Freq: Once | INTRAMUSCULAR | Status: AC | PRN
  Administered 2019-01-14: 16:00:00 100 mL via INTRAVENOUS

## 2019-01-14 MED ORDER — SODIUM CHLORIDE (PF) 0.9 % IJ SOLN
INTRAMUSCULAR | Status: AC
  Filled 2019-01-14: qty 50

## 2019-01-14 NOTE — Telephone Encounter (Signed)
-----   Message from Gatha Mayer, MD sent at 01/13/2019  5:20 PM EDT ----- Regarding: needs CT I have discussed her case with Janett Billow and reviewed the Chart  She needs a CT pelvis with contrast Will also need CBC w/ diiff and a BMET   It would be good if she could do the CT by Friday - would do anywhere that can get it done  Explain to her that I think she could have an abscess and though Janett Billow was right to set up colonoscopy I think this will help too and may make the colonoscopy unnecessary and get to the cause sooner  I know she had a CT 6/30 but now has been 2 months so appropriate to repeat since Jess felt a mass    Dx  Rectal mass Crohn's disease  R/o abscess

## 2019-01-14 NOTE — Progress Notes (Signed)
I discussed results with her Rectal cancer seems very unlikely since had colonoscopy 2019  She is ok right now  Plan is to go to ED NPO except meds in AM - sooner if worse  I told her we are cancelling colonoscopy so please do that

## 2019-01-14 NOTE — Telephone Encounter (Signed)
Left her another message to call me back.

## 2019-01-14 NOTE — Telephone Encounter (Signed)
Patient informed of today's appointment time and place. She will come by here at 12 to get labs and contrast.

## 2019-01-15 ENCOUNTER — Other Ambulatory Visit: Payer: Self-pay

## 2019-01-15 ENCOUNTER — Encounter (HOSPITAL_COMMUNITY): Admission: EM | Disposition: A | Discharge status: Home / Self Care | Payer: Self-pay | Source: Home / Self Care

## 2019-01-15 ENCOUNTER — Encounter (HOSPITAL_COMMUNITY): Payer: Self-pay | Admitting: Emergency Medicine

## 2019-01-15 ENCOUNTER — Inpatient Hospital Stay (HOSPITAL_COMMUNITY)
Admission: EM | Admit: 2019-01-15 | Discharge: 2019-01-17 | DRG: 333 | Disposition: A | Discharge status: Home / Self Care | Payer: BC Managed Care – PPO | Attending: Surgery | Admitting: Surgery

## 2019-01-15 ENCOUNTER — Inpatient Hospital Stay (HOSPITAL_COMMUNITY): Payer: BC Managed Care – PPO | Admitting: Certified Registered"

## 2019-01-15 DIAGNOSIS — C210 Malignant neoplasm of anus, unspecified: Secondary | ICD-10-CM | POA: Diagnosis present

## 2019-01-15 DIAGNOSIS — Z90710 Acquired absence of both cervix and uterus: Secondary | ICD-10-CM

## 2019-01-15 DIAGNOSIS — Z972 Presence of dental prosthetic device (complete) (partial): Secondary | ICD-10-CM

## 2019-01-15 DIAGNOSIS — Z79899 Other long term (current) drug therapy: Secondary | ICD-10-CM | POA: Diagnosis not present

## 2019-01-15 DIAGNOSIS — Z20828 Contact with and (suspected) exposure to other viral communicable diseases: Secondary | ICD-10-CM | POA: Diagnosis present

## 2019-01-15 DIAGNOSIS — K50914 Crohn's disease, unspecified, with abscess: Secondary | ICD-10-CM | POA: Diagnosis present

## 2019-01-15 DIAGNOSIS — I10 Essential (primary) hypertension: Secondary | ICD-10-CM | POA: Diagnosis present

## 2019-01-15 DIAGNOSIS — K611 Rectal abscess: Secondary | ICD-10-CM | POA: Diagnosis present

## 2019-01-15 DIAGNOSIS — Z7722 Contact with and (suspected) exposure to environmental tobacco smoke (acute) (chronic): Secondary | ICD-10-CM | POA: Diagnosis present

## 2019-01-15 DIAGNOSIS — Z7982 Long term (current) use of aspirin: Secondary | ICD-10-CM | POA: Diagnosis not present

## 2019-01-15 DIAGNOSIS — H2513 Age-related nuclear cataract, bilateral: Secondary | ICD-10-CM | POA: Diagnosis present

## 2019-01-15 DIAGNOSIS — C20 Malignant neoplasm of rectum: Secondary | ICD-10-CM | POA: Diagnosis present

## 2019-01-15 HISTORY — PX: INCISION AND DRAINAGE PERIRECTAL ABSCESS: SHX1804

## 2019-01-15 LAB — CBC WITH DIFFERENTIAL/PLATELET
Abs Immature Granulocytes: 0.06 10*3/uL (ref 0.00–0.07)
Basophils Absolute: 0 10*3/uL (ref 0.0–0.1)
Basophils Relative: 0 %
Eosinophils Absolute: 0 10*3/uL (ref 0.0–0.5)
Eosinophils Relative: 0 %
HCT: 29.2 % — ABNORMAL LOW (ref 36.0–46.0)
Hemoglobin: 9.2 g/dL — ABNORMAL LOW (ref 12.0–15.0)
Immature Granulocytes: 1 %
Lymphocytes Relative: 13 %
Lymphs Abs: 1.5 10*3/uL (ref 0.7–4.0)
MCH: 28.2 pg (ref 26.0–34.0)
MCHC: 31.5 g/dL (ref 30.0–36.0)
MCV: 89.6 fL (ref 80.0–100.0)
Monocytes Absolute: 1.1 10*3/uL — ABNORMAL HIGH (ref 0.1–1.0)
Monocytes Relative: 9 %
Neutro Abs: 9.5 10*3/uL — ABNORMAL HIGH (ref 1.7–7.7)
Neutrophils Relative %: 77 %
Platelets: 323 10*3/uL (ref 150–400)
RBC: 3.26 MIL/uL — ABNORMAL LOW (ref 3.87–5.11)
RDW: 12.5 % (ref 11.5–15.5)
WBC: 12.2 10*3/uL — ABNORMAL HIGH (ref 4.0–10.5)
nRBC: 0 % (ref 0.0–0.2)

## 2019-01-15 LAB — COMPREHENSIVE METABOLIC PANEL
ALT: 16 U/L (ref 0–44)
AST: 14 U/L — ABNORMAL LOW (ref 15–41)
Albumin: 3.6 g/dL (ref 3.5–5.0)
Alkaline Phosphatase: 71 U/L (ref 38–126)
Anion gap: 8 (ref 5–15)
BUN: 12 mg/dL (ref 6–20)
CO2: 28 mmol/L (ref 22–32)
Calcium: 9.8 mg/dL (ref 8.9–10.3)
Chloride: 99 mmol/L (ref 98–111)
Creatinine, Ser: 0.66 mg/dL (ref 0.44–1.00)
GFR calc Af Amer: >60 mL/min (ref >60)
GFR calc non Af Amer: >60 mL/min (ref >60)
Glucose, Bld: 106 mg/dL — ABNORMAL HIGH (ref 70–99)
Potassium: 3.9 mmol/L (ref 3.5–5.1)
Sodium: 135 mmol/L (ref 135–145)
Total Bilirubin: 0.7 mg/dL (ref 0.3–1.2)
Total Protein: 8.1 g/dL (ref 6.5–8.1)

## 2019-01-15 LAB — URINALYSIS, ROUTINE W REFLEX MICROSCOPIC
Bilirubin Urine: NEGATIVE
Glucose, UA: NEGATIVE mg/dL
Hgb urine dipstick: NEGATIVE
Ketones, ur: 5 mg/dL — AB
Leukocytes,Ua: NEGATIVE
Nitrite: NEGATIVE
Protein, ur: NEGATIVE mg/dL
Specific Gravity, Urine: 1.016 (ref 1.005–1.030)
pH: 6 (ref 5.0–8.0)

## 2019-01-15 LAB — SARS CORONAVIRUS 2 BY RT PCR (HOSPITAL ORDER, PERFORMED IN ~~LOC~~ HOSPITAL LAB): SARS Coronavirus 2: NEGATIVE

## 2019-01-15 SURGERY — INCISION AND DRAINAGE, ABSCESS, PERIRECTAL
Anesthesia: General | Site: Rectum

## 2019-01-15 MED ORDER — PHENYLEPHRINE 40 MCG/ML (10ML) SYRINGE FOR IV PUSH (FOR BLOOD PRESSURE SUPPORT)
PREFILLED_SYRINGE | INTRAVENOUS | Status: AC
  Filled 2019-01-15: qty 30

## 2019-01-15 MED ORDER — ACETAMINOPHEN 160 MG/5ML PO SOLN: 325.0000 mg | ORAL | Status: DC | PRN

## 2019-01-15 MED ORDER — ONDANSETRON HCL 4 MG/2ML IJ SOLN: 4.0000 mg | Freq: Four times a day (QID) | INTRAMUSCULAR | Status: DC | PRN

## 2019-01-15 MED ORDER — KETOROLAC TROMETHAMINE 30 MG/ML IJ SOLN
30.0000 mg | Freq: Once | INTRAMUSCULAR | Status: AC | PRN
  Administered 2019-01-15: 30 mg via INTRAVENOUS

## 2019-01-15 MED ORDER — LIDOCAINE 2% (20 MG/ML) 5 ML SYRINGE
INTRAMUSCULAR | Status: AC
  Filled 2019-01-15: qty 5

## 2019-01-15 MED ORDER — FENTANYL CITRATE (PF) 100 MCG/2ML IJ SOLN
INTRAMUSCULAR | Status: AC
  Filled 2019-01-15: qty 2

## 2019-01-15 MED ORDER — FENTANYL CITRATE (PF) 100 MCG/2ML IJ SOLN
INTRAMUSCULAR | Status: DC | PRN
  Administered 2019-01-15 (×6): 50 ug via INTRAVENOUS

## 2019-01-15 MED ORDER — 0.9 % SODIUM CHLORIDE (POUR BTL) OPTIME
TOPICAL | Status: DC | PRN
  Administered 2019-01-15: 17:00:00 1000 mL

## 2019-01-15 MED ORDER — KCL IN DEXTROSE-NACL 20-5-0.9 MEQ/L-%-% IV SOLN
INTRAVENOUS | Status: DC
  Administered 2019-01-16 (×2): via INTRAVENOUS
  Filled 2019-01-15 (×3): qty 1000

## 2019-01-15 MED ORDER — MIDAZOLAM HCL 5 MG/5ML IJ SOLN
INTRAMUSCULAR | Status: DC | PRN
  Administered 2019-01-15: 2 mg via INTRAVENOUS

## 2019-01-15 MED ORDER — ACETAMINOPHEN 325 MG PO TABS: 325.0000 mg | ORAL_TABLET | ORAL | Status: DC | PRN

## 2019-01-15 MED ORDER — BUPIVACAINE-EPINEPHRINE 0.5% -1:200000 IJ SOLN
INTRAMUSCULAR | Status: AC
  Filled 2019-01-15: qty 1

## 2019-01-15 MED ORDER — ONDANSETRON 4 MG PO TBDP: 4.0000 mg | ORAL_TABLET | Freq: Four times a day (QID) | ORAL | Status: DC | PRN

## 2019-01-15 MED ORDER — FENTANYL CITRATE (PF) 100 MCG/2ML IJ SOLN
INTRAMUSCULAR | Status: AC
  Filled 2019-01-15: qty 4

## 2019-01-15 MED ORDER — EPHEDRINE 5 MG/ML INJ
INTRAVENOUS | Status: AC
  Filled 2019-01-15: qty 20

## 2019-01-15 MED ORDER — PIPERACILLIN-TAZOBACTAM 3.375 G IVPB 30 MIN
3.3750 g | Freq: Once | INTRAVENOUS | Status: AC
  Administered 2019-01-15: 3.375 g via INTRAVENOUS
  Filled 2019-01-15: qty 50

## 2019-01-15 MED ORDER — KETOROLAC TROMETHAMINE 30 MG/ML IJ SOLN
INTRAMUSCULAR | Status: AC
  Filled 2019-01-15: qty 1

## 2019-01-15 MED ORDER — SUCCINYLCHOLINE CHLORIDE 200 MG/10ML IV SOSY
PREFILLED_SYRINGE | INTRAVENOUS | Status: AC
  Filled 2019-01-15: qty 10

## 2019-01-15 MED ORDER — HYDROCODONE-ACETAMINOPHEN 5-325 MG PO TABS
1.0000 | ORAL_TABLET | ORAL | Status: DC | PRN
  Administered 2019-01-15 – 2019-01-17 (×5): 2 via ORAL
  Filled 2019-01-15 (×5): qty 2

## 2019-01-15 MED ORDER — MIDAZOLAM HCL 2 MG/2ML IJ SOLN
INTRAMUSCULAR | Status: AC
  Filled 2019-01-15: qty 2

## 2019-01-15 MED ORDER — DIPHENHYDRAMINE HCL 50 MG/ML IJ SOLN: 12.5000 mg | Freq: Four times a day (QID) | INTRAMUSCULAR | Status: DC | PRN

## 2019-01-15 MED ORDER — PIPERACILLIN-TAZOBACTAM 3.375 G IVPB: 3.3750 g | Freq: Three times a day (TID) | INTRAVENOUS | Status: DC

## 2019-01-15 MED ORDER — PROPOFOL 10 MG/ML IV BOLUS
INTRAVENOUS | Status: DC | PRN
  Administered 2019-01-15: 140 mg via INTRAVENOUS

## 2019-01-15 MED ORDER — PROMETHAZINE HCL 25 MG/ML IJ SOLN: 6.2500 mg | INTRAMUSCULAR | Status: DC | PRN

## 2019-01-15 MED ORDER — FENTANYL CITRATE (PF) 100 MCG/2ML IJ SOLN
25.0000 ug | INTRAMUSCULAR | Status: DC | PRN
  Administered 2019-01-15 (×2): 50 ug via INTRAVENOUS

## 2019-01-15 MED ORDER — OXYCODONE HCL 5 MG/5ML PO SOLN: 5.0000 mg | Freq: Once | ORAL | Status: DC | PRN

## 2019-01-15 MED ORDER — LIDOCAINE 2% (20 MG/ML) 5 ML SYRINGE
INTRAMUSCULAR | Status: DC | PRN
  Administered 2019-01-15: 60 mg via INTRAVENOUS

## 2019-01-15 MED ORDER — DIPHENHYDRAMINE HCL 12.5 MG/5ML PO ELIX: 12.5000 mg | ORAL_SOLUTION | Freq: Four times a day (QID) | ORAL | Status: DC | PRN

## 2019-01-15 MED ORDER — MORPHINE SULFATE (PF) 2 MG/ML IV SOLN
1.0000 mg | INTRAVENOUS | Status: DC | PRN
  Administered 2019-01-17: 2 mg via INTRAVENOUS
  Administered 2019-01-17: 1 mg via INTRAVENOUS
  Filled 2019-01-15 (×2): qty 1

## 2019-01-15 MED ORDER — ONDANSETRON HCL 4 MG/2ML IJ SOLN
INTRAMUSCULAR | Status: DC | PRN
  Administered 2019-01-15: 4 mg via INTRAVENOUS

## 2019-01-15 MED ORDER — OXYCODONE HCL 5 MG PO TABS: 5.0000 mg | ORAL_TABLET | Freq: Once | ORAL | Status: DC | PRN

## 2019-01-15 MED ORDER — PHENYLEPHRINE 40 MCG/ML (10ML) SYRINGE FOR IV PUSH (FOR BLOOD PRESSURE SUPPORT)
PREFILLED_SYRINGE | INTRAVENOUS | Status: DC | PRN
  Administered 2019-01-15 (×2): 120 ug via INTRAVENOUS
  Administered 2019-01-15: 80 ug via INTRAVENOUS
  Administered 2019-01-15: 120 ug via INTRAVENOUS

## 2019-01-15 MED ORDER — MEPERIDINE HCL 50 MG/ML IJ SOLN: 6.2500 mg | INTRAMUSCULAR | Status: DC | PRN

## 2019-01-15 MED ORDER — PIPERACILLIN-TAZOBACTAM 3.375 G IVPB
3.3750 g | Freq: Three times a day (TID) | INTRAVENOUS | Status: DC
  Administered 2019-01-15 – 2019-01-17 (×5): 3.375 g via INTRAVENOUS
  Filled 2019-01-15 (×5): qty 50

## 2019-01-15 MED ORDER — BUPIVACAINE-EPINEPHRINE (PF) 0.5% -1:200000 IJ SOLN
INTRAMUSCULAR | Status: DC | PRN
  Administered 2019-01-15: 10 mL

## 2019-01-15 MED ORDER — DEXAMETHASONE SODIUM PHOSPHATE 10 MG/ML IJ SOLN
INTRAMUSCULAR | Status: DC | PRN
  Administered 2019-01-15: 10 mg via INTRAVENOUS

## 2019-01-15 MED ORDER — LACTATED RINGERS IV SOLN
INTRAVENOUS | Status: DC
  Administered 2019-01-15 (×2): via INTRAVENOUS

## 2019-01-15 MED ORDER — MORPHINE SULFATE (PF) 2 MG/ML IV SOLN: 2.0000 mg | INTRAVENOUS | Status: DC | PRN

## 2019-01-15 SURGICAL SUPPLY — 23 items
BLADE SURG 15 STRL LF DISP TIS (BLADE) ×1 IMPLANT
BLADE SURG 15 STRL SS (BLADE) ×2
CLEANER TIP ELECTROSURG 2X2 (MISCELLANEOUS) IMPLANT
COVER WAND RF STERILE (DRAPES) IMPLANT
DRSG PAD ABDOMINAL 8X10 ST (GAUZE/BANDAGES/DRESSINGS) ×2 IMPLANT
ELECT PENCIL ROCKER SW 15FT (MISCELLANEOUS) IMPLANT
ELECT REM PT RETURN 15FT ADLT (MISCELLANEOUS) IMPLANT
GAUZE 4X4 16PLY RFD (DISPOSABLE) ×2 IMPLANT
GAUZE PACKING IODOFORM 1X5 (MISCELLANEOUS) IMPLANT
GAUZE SPONGE 4X4 12PLY STRL (GAUZE/BANDAGES/DRESSINGS) ×2 IMPLANT
GLOVE BIO SURGEON STRL SZ7.5 (GLOVE) ×2 IMPLANT
GOWN STRL REUS W/TWL LRG LVL3 (GOWN DISPOSABLE) ×4 IMPLANT
KIT BASIN OR (CUSTOM PROCEDURE TRAY) ×2 IMPLANT
KIT TURNOVER KIT A (KITS) IMPLANT
NS IRRIG 1000ML POUR BTL (IV SOLUTION) ×2 IMPLANT
PACK LITHOTOMY IV (CUSTOM PROCEDURE TRAY) ×2 IMPLANT
SPONGE SURGIFOAM ABS GEL 12-7 (HEMOSTASIS) ×1 IMPLANT
SWAB COLLECTION DEVICE MRSA (MISCELLANEOUS) ×2 IMPLANT
SWAB CULTURE ESWAB REG 1ML (MISCELLANEOUS) ×2 IMPLANT
TAPE CLOTH SURG 6X10 WHT LF (GAUZE/BANDAGES/DRESSINGS) ×1 IMPLANT
TOWEL OR 17X26 10 PK STRL BLUE (TOWEL DISPOSABLE) ×2 IMPLANT
UNDERPAD 30X36 HEAVY ABSORB (UNDERPADS AND DIAPERS) ×2 IMPLANT
YANKAUER SUCT BULB TIP NO VENT (SUCTIONS) ×2 IMPLANT

## 2019-01-15 NOTE — Anesthesia Procedure Notes (Signed)
Procedure Name: LMA Insertion Date/Time: 01/15/2019 4:32 PM Performed by: Silas Sacramento, CRNA Pre-anesthesia Checklist: Patient identified, Emergency Drugs available, Suction available and Patient being monitored Patient Re-evaluated:Patient Re-evaluated prior to induction Oxygen Delivery Method: Circle system utilized Preoxygenation: Pre-oxygenation with 100% oxygen Induction Type: IV induction LMA: LMA inserted LMA Size: 4.0 Tube type: Oral Number of attempts: 1 Airway Equipment and Method: Stylet and Oral airway Placement Confirmation: ETT inserted through vocal cords under direct vision,  positive ETCO2 and breath sounds checked- equal and bilateral Tube secured with: Tape Dental Injury: Teeth and Oropharynx as per pre-operative assessment

## 2019-01-15 NOTE — Anesthesia Postprocedure Evaluation (Signed)
Anesthesia Post Note  Patient: Leah Bailey  Procedure(s) Performed: INTERNAL DRAINAGE OF PERIRECTAL ABSCESS (N/A Rectum)     Patient location during evaluation: PACU Anesthesia Type: General Level of consciousness: awake and alert Pain management: pain level controlled Vital Signs Assessment: post-procedure vital signs reviewed and stable Respiratory status: spontaneous breathing, nonlabored ventilation, respiratory function stable and patient connected to nasal cannula oxygen Cardiovascular status: blood pressure returned to baseline and stable Postop Assessment: no apparent nausea or vomiting Anesthetic complications: no    Last Vitals:  Vitals:   01/15/19 1740 01/15/19 1745  BP:  107/69  Pulse: 69 70  Resp: 12 10  Temp:    SpO2: 100% 100%    Last Pain:  Vitals:   01/15/19 1745  TempSrc:   PainSc: Asleep                 Effie Berkshire

## 2019-01-15 NOTE — Transfer of Care (Signed)
Immediate Anesthesia Transfer of Care Note  Patient: Leah Bailey  Procedure(s) Performed: INTERNAL DRAINAGE OF PERIRECTAL ABSCESS (N/A Rectum)  Patient Location: PACU  Anesthesia Type:General  Level of Consciousness: awake, oriented and patient cooperative  Airway & Oxygen Therapy: Patient Spontanous Breathing and Patient connected to face mask oxygen  Post-op Assessment: Report given to RN and Post -op Vital signs reviewed and stable  Post vital signs: Reviewed and stable  Last Vitals:  Vitals Value Taken Time  BP 96/85 01/15/19 1717  Temp    Pulse 82 01/15/19 1720  Resp 14 01/15/19 1720  SpO2 100 % 01/15/19 1720  Vitals shown include unvalidated device data.  Last Pain:  Vitals:   01/15/19 1507  TempSrc:   PainSc: 7       Patients Stated Pain Goal: 3 (86/76/19 5093)  Complications: No apparent anesthesia complications

## 2019-01-15 NOTE — Op Note (Signed)
01/15/2019  5:03 PM  PATIENT:  Leah Bailey  63 y.o. female  PRE-OPERATIVE DIAGNOSIS:  PERIRECTAL ABSCESS  POST-OPERATIVE DIAGNOSIS:  PERIRECTAL ABSCESS  PROCEDURE:  Procedure(s): INTERNAL DRAINAGE OF PERIRECTAL ABSCESS (N/A)  SURGEON:  Surgeon(s) and Role:    * Jovita Kussmaul, MD - Primary  PHYSICIAN ASSISTANT:   ASSISTANTS: none   ANESTHESIA:   local and general  EBL:  150 mL   BLOOD ADMINISTERED:none  DRAINS: none   LOCAL MEDICATIONS USED:  MARCAINE     SPECIMEN:  Source of Specimen:  RECTAL WALL MASS  DISPOSITION OF SPECIMEN:  PATHOLOGY  COUNTS:  YES  TOURNIQUET:  * No tourniquets in log *  DICTATION: .Dragon Dictation   After informed consent was obtained the patient was brought to the operating room and placed in the supine position on the operating table.  After adequate induction of general anesthesia the patient was moved in lithotomy position and all pressure points were padded.  An appropriate timeout was performed.  The perirectal area was prepped with Betadine and draped in usual sterile manner.  The perirectal region was then infiltrated with quarter percent Marcaine.  On digital exam I could feel a large fluctuant area in the left posterior rectum.  A retractor was placed in the rectum and this area was examined.  It had the appearance of an ulcer with heaped up edges.  Part of the wall was excised sharply with the electrocautery and sent for pathology.  In doing this I was able to access the abscess cavity and evacuated a large amount of pus.  The edges were then fulgurated with electrocautery and pressure was held until the area was completely hemostatic.  A small piece of Gelfoam was placed in the cavity as well and pressure was held to ensure hemostasis.  Exterior dressings were then applied.  The patient tolerated the procedure well.  At the end of the case all needle sponge and instrument counts were correct.  The patient was then awakened and taken  to recovery in stable condition.  PLAN OF CARE: Admit to inpatient   PATIENT DISPOSITION:  PACU - hemodynamically stable.   Delay start of Pharmacological VTE agent (>24hrs) due to surgical blood loss or risk of bleeding: no

## 2019-01-15 NOTE — Interval H&P Note (Signed)
History and Physical Interval Note:  01/15/2019 3:41 PM  Leah Bailey  has presented today for surgery, with the diagnosis of CHRON'S DISEASE.  The various methods of treatment have been discussed with the patient and family. After consideration of risks, benefits and other options for treatment, the patient has consented to  Procedure(s): IRRIGATION AND DEBRIDEMENT PERIRECTAL ABSCESS (N/A) as a surgical intervention.  The patient's history has been reviewed, patient examined, no change in status, stable for surgery.  I have reviewed the patient's chart and labs.  Questions were answered to the patient's satisfaction.     Autumn Messing III

## 2019-01-15 NOTE — H&P (Signed)
[facility] Admission Note  Leah Bailey 1955/06/11  [id number].    Requesting MD: Dr. Carlean Purl Chief Complaint:  Rectal pain Reason for Consult: 3 x 4.8 cm Perirectal abscess  HPI: Patient is a 63 year old female with a history IBD on mesalamine followed by Dr. Carlean Purl who was referred to the ED for rectal pain.   Patient reports that she has been having intermittent episodes of left-sided rectal pain as well as intermittent bloody stools for the last 3 months. Her symptoms recently became more severe recently and associated with intermittent episodes of lower back pan and lower abdominal cramping. She has tried over-the-counter hydrocortisone, Preparation H, and Aleve without any relief of her symptoms. She has also tried Bentyl without any relief. She was seen at Dr. Evern Bio office on 8/26 for this.  They sent her for a CT of the pelvis with contrast yesterday that showed a 3 x 4.8 x 2 cm perirectal abscess in the left posterior aspect of the rectum inseparable from the wall of the colon and adjacent musculature. This also showed some asymmetric thickening of the rectum associated with the abscess.  There was concern over a perforated rectal carcinoma. Per Dr. Celesta Aver note he believes rectal cancer is unlikely since she had a colonoscopy in 2019. Labs yesterday significant for WBC 12.2, Hgb 9.3. Patient was referred to the ED at our service for evaluation. Hx of abdominal hysterectomy. She is not on blood thinners. She has been NPO since 6pm last night.   ROS: Review of Systems  Constitutional: Negative for chills and fever.  Respiratory: Negative for cough and shortness of breath.   Cardiovascular: Negative for chest pain.  Gastrointestinal: Positive for blood in stool. Negative for abdominal pain, constipation, diarrhea, nausea and vomiting.       Rectal pain  Genitourinary: Negative for dysuria.  All other systems reviewed and are negative.   Family History   Problem Relation Age of Onset  . Diabetes Mother   . Breast cancer Mother 70  . Heart attack Father   . Heart disease Father   . Diabetes Sister   . Diabetes Brother   . Diabetes Sister   . Healthy Son   . Healthy Son   . Diabetes Brother   . Breast cancer Cousin   . Colon cancer Neg Hx   . Esophageal cancer Neg Hx   . Stomach cancer Neg Hx   . Rectal cancer Neg Hx     Past Medical History:  Diagnosis Date  . Herpes simplex disciform keratitis 10/20/2013  . Hypertension   . IBD (inflammatory bowel disease) - colitis 01/11/2018  . Nuclear sclerosis of both eyes 11/30/2014  . UC (ulcerative colitis) [facility])     Past Surgical History:  Procedure Laterality Date  . ABDOMINAL HYSTERECTOMY    . BREAST CYST ASPIRATION Left 1983  . COLONOSCOPY    . TUBAL LIGATION      Social History:  reports that she is a non-smoker but has been exposed to tobacco smoke. She has never used smokeless tobacco. She reports that she does not drink alcohol or use drugs.  Allergies: No Known Allergies  Prior to Admission medications   Medication Sig Start Date End Date Taking? Authorizing Provider  aspirin 81 MG tablet Take 81 mg by mouth daily.    [provider]  ferrous sulfate 325 (65 FE) MG EC tablet Take 650 mg by mouth daily with breakfast.     [provider]  lisinopril-hydrochlorothiazide ([place]) 20-25  MG tablet Take 1 tablet by mouth daily. 10/24/17   Harrison Mons, PA  mesalamine (LIALDA) 1.2 g EC tablet TAKE 2 TABLETS (2.4 G TOTAL) BY MOUTH DAILY WITH BREAKFAST. 04/06/18   Gatha Mayer, MD  Multiple Vitamins-Calcium (ONE-A-DAY WOMENS PO) Take 1 tablet by mouth daily.    [provider]  naproxen sodium (ALEVE) 220 MG tablet Take 220 mg by mouth daily as needed.    [provider]  OVER THE COUNTER MEDICATION Take 600 mg by mouth daily. Calcium    [provider]  traMADol (ULTRAM) 50 MG tablet Take 1 tablet (50 mg total) by  mouth every 8 (eight) hours as needed. 01/13/19   Zehr, Laban Emperor, PA-C     Blood pressure 110/64, pulse 81, temperature 99.5 F (37.5 C), temperature source Oral, resp. rate 17, SpO2 100 %. Physical Exam: General: pleasant, WD, WN female who is laying in bed in NAD HEENT: head is normocephalic, atraumatic.  Sclera are noninjected.  PERRL.  Ears and nose without any masses or lesions.  Mouth is pink and moist. Upper dentures intact.  Heart: regular, rate, and rhythm.  Normal s1,s2. No obvious murmurs, gallops, or rubs noted.  Palpable radial and pedal pulses bilaterally Lungs: CTAB, no wheezes, rhonchi, or rales noted.  Respiratory effort nonlabored Abd: Soft, NT, ND, +BS, no masses, hernias, or organomegaly GU: Chaperone present. Exam per Dr. Marlou Starks. External exam soft without induration, fluctuance or tenderness. No significant external exam findings.  MS: all 4 extremities are symmetrical with no cyanosis, clubbing, or edema. Skin: warm and dry with no masses, lesions, or rashes Psych: A&Ox3 with an appropriate affect.   Results for orders placed or performed in visit on 01/14/19 (from the past 48 hour(s))  Basic metabolic panel     Status: Abnormal   Collection Time: 01/14/19 12:08 PM  Result Value Ref Range   Sodium 129 (L) 135 - 145 mEq/L   Potassium 4.2 3.5 - 5.1 mEq/L   Chloride 95 (L) 96 - 112 mEq/L   CO2 28 19 - 32 mEq/L   Glucose, Bld 109 (H) 70 - 99 mg/dL   BUN 17 6 - 23 mg/dL   Creatinine, Ser 0.71 0.40 - 1.20 mg/dL   Calcium 10.4 8.4 - 10.5 mg/dL   GFR 101.32 >60.00 mL/min  CBC with Differential/Platelet     Status: Abnormal   Collection Time: 01/14/19 12:08 PM  Result Value Ref Range   WBC 12.2 (H) 4.0 - 10.5 K/uL   RBC 3.28 (L) 3.87 - 5.11 Mil/uL   Hemoglobin 9.3 (L) 12.0 - 15.0 g/dL   HCT 28.1 (L) 36.0 - 46.0 %   MCV 85.6 78.0 - 100.0 fl   MCHC 33.0 30.0 - 36.0 g/dL   RDW 12.9 11.5 - 15.5 %   Platelets 350.0 150.0 - 400.0 K/uL   Neutrophils Relative % 77.8 (H)  43.0 - 77.0 %   Lymphocytes Relative 12.9 12.0 - 46.0 %   Monocytes Relative 8.8 3.0 - 12.0 %   Eosinophils Relative 0.2 0.0 - 5.0 %   Basophils Relative 0.3 0.0 - 3.0 %   Neutro Abs 9.5 (H) 1.4 - 7.7 K/uL   Lymphs Abs 1.6 0.7 - 4.0 K/uL   Monocytes Absolute 1.1 (H) 0.1 - 1.0 K/uL   Eosinophils Absolute 0.0 0.0 - 0.7 K/uL   Basophils Absolute 0.0 0.0 - 0.1 K/uL   Ct Pelvis W Contrast  Result Date: 01/14/2019 CLINICAL DATA:  63 year old female with a  history of possible abscess. History of Crohn's disease EXAM: CT PELVIS WITH CONTRAST TECHNIQUE: Multidetector CT imaging of the pelvis was performed using the standard protocol following the bolus administration of intravenous contrast. CONTRAST:  164m OMNIPAQUE IOHEXOL 300 MG/ML  SOLN COMPARISON:  November 17, 2018 FINDINGS: Urinary Tract:  Urinary bladder unremarkable. Bowel:  Visualized small bowel unremarkable.  Normal appendix. The cecum is unremarkable. Rim enhancing fluid collection adjacent to the rectum, inseparable from the wall of the rectum measures 3 cm on the axial images, approximately 4.8 cm in cranial caudal span on the coronal images. There is asymmetric wall thickening of the rectum on image 44 series 2, at the level of the abscess. Associated inflammatory changes in the perirectal fat. Small lymph nodes are present along the left perirectal fat. There is a lymph node adjacent to the rectum on image 33 of series 2 which measures 13 mm. This was present on the comparison CT of June 2020, and appears to have enlarged slightly in the interval. Vascular/Lymphatic: No significant atherosclerotic changes. Small lymph nodes within the fat in the perirectal region. Reproductive:  Hysterectomy Other:  None Musculoskeletal: No acute displaced fracture. IMPRESSION: Perirectal abscess, at the left posterior aspect of the rectum, inseparable from the wall of the colon and the adjacent pelvic musculature. Largest diameter on cranial caudal imaging  measures 4.8 cm. Asymmetric thickening of the rectum associated with the abscess. A perforated rectal carcinoma is favored given the appearance, and correlation with physical exam as well anoscopy/rigid sigmoidoscopy. Alternatively, the changes may reflect chronic inflammation in the setting of inflammatory bowel disease. There are several small lymph nodes within the perirectal fat and along the left pelvis, the largest measuring 13 mm, potentially pathologic in the setting of carcinoma, or alternatively reactive. Electronically Signed   By: JCorrie MckusickD.O.   On: 01/14/2019 16:05   Anti-infectives (From admission, onward)   Start     Dose/Rate Route Frequency Ordered Stop   01/15/19 1400  piperacillin-tazobactam (ZOSYN) IVPB 3.375 g  Status:  Discontinued     3.375 g 12.5 mL/hr over 240 Minutes Intravenous Every 8 hours 01/15/19 1104 01/15/19 1105   01/15/19 1400  piperacillin-tazobactam (ZOSYN) IVPB 3.375 g    Note to Pharmacy: Continue on schedule after first dose in ED   3.375 g 12.5 mL/hr over 240 Minutes Intravenous Every 8 hours 01/15/19 1105     01/15/19 1100  piperacillin-tazobactam (ZOSYN) IVPB 3.375 g     3.375 g 100 mL/hr over 30 Minutes Intravenous  Once 01/15/19 1046        Assessment/Plan Hypertension Hx herpes simplex disciform keratitis Nuclear sclerosis of both eyes COVID study pending  3 x 2 x 4.8 cm perirectal abscess Hx of IBD - Plan for OR today with Dr. TMarlou Starks- IV abx - NPO  FEN:  NPO/IV fluids ID:  [id number] VTE: SCDs  [facility]Alferd Apa [facility] 01/15/2019, 11:14 AM [phone]

## 2019-01-15 NOTE — Progress Notes (Signed)
   I greatly appreciate surgery assistance and care of Ms. Shiflet.  Please contact me if I may be of assistance.  Gatha Mayer, MD, [facility] 01/15/2019 11:49 AM Pager [phone] Cell [phone]

## 2019-01-15 NOTE — ED Provider Notes (Signed)
[facility] DEPT Provider Note   CSN: [id number] Arrival date & time: 01/15/19  9470     History   Chief Complaint Chief Complaint  Patient presents with   rectal mass    HPI Leah Bailey is a 63 y.o. female.     Leah Bailey is a 63 y.o. female with a history of ulcerative colitis, hypertension, who presents to the ED for evaluation of perirectal abscess.  She was recently seen by her GI doctor, Dr. Carlean Purl, for evaluation of the painful rectal mass that she is noticed over the past 3 months that has been worsening.  She reports that she has had pain with bowel movements and has had to take a laxative in order to be able to pass her stools.  She has had some chills but no fevers.  No vomiting, no abdominal pain.  Had a CT performed which shows a perirectal abscess.  Dr. Carlean Purl had already discussed case with Dr. Marlou Starks with general surgery and she was sent here for surgical consultation.  She has not had anything to eat or drink aside from taking a dose of pain medication about 1 hour prior to arrival.  No other aggravating or alleviating factors.     Past Medical History:  Diagnosis Date   Herpes simplex disciform keratitis 10/20/2013   Hypertension    IBD (inflammatory bowel disease) - colitis 01/11/2018   Nuclear sclerosis of both eyes 11/30/2014   UC (ulcerative colitis) [facility])     Patient Active Problem List   Diagnosis Date Noted   Rectal pain 01/13/2019   Rectal mass 01/13/2019   IBD (inflammatory bowel disease) - colitis 01/11/2018   Hyperlipidemia 10/25/2017   Hyperglycemia 10/25/2017   Essential hypertension, benign 10/24/2017    Past Surgical History:  Procedure Laterality Date   ABDOMINAL HYSTERECTOMY     BREAST CYST ASPIRATION Left 1983   COLONOSCOPY     TUBAL LIGATION       OB History   No obstetric history on file.      Home Medications    Prior to Admission medications   Medication Sig  Start Date End Date Taking? Authorizing Provider  aspirin 81 MG tablet Take 81 mg by mouth daily.    [provider]  ferrous sulfate 325 (65 FE) MG EC tablet Take 650 mg by mouth daily with breakfast.     [provider]  lisinopril-hydrochlorothiazide (PRINZIDE,ZESTORETIC) 20-25 MG tablet Take 1 tablet by mouth daily. 10/24/17   Harrison Mons, PA  mesalamine (LIALDA) 1.2 g EC tablet TAKE 2 TABLETS (2.4 G TOTAL) BY MOUTH DAILY WITH BREAKFAST. 04/06/18   Gatha Mayer, MD  Multiple Vitamins-Calcium (ONE-A-DAY WOMENS PO) Take 1 tablet by mouth daily.    [provider]  naproxen sodium (ALEVE) 220 MG tablet Take 220 mg by mouth daily as needed.    [provider]  OVER THE COUNTER MEDICATION Take 600 mg by mouth daily. Calcium    [provider]  traMADol (ULTRAM) 50 MG tablet Take 1 tablet (50 mg total) by mouth every 8 (eight) hours as needed. 01/13/19   Zehr, Laban Emperor, PA-C    Family History Family History  Problem Relation Age of Onset   Diabetes Mother    Breast cancer Mother 30   Heart attack Father    Heart disease Father    Diabetes Sister    Diabetes Brother    Diabetes Sister    Healthy Son  Healthy Son    Diabetes Brother    Breast cancer Cousin    Colon cancer Neg Hx    Esophageal cancer Neg Hx    Stomach cancer Neg Hx    Rectal cancer Neg Hx     Social History Social History   Tobacco Use   Smoking status: Passive Smoke Exposure - Never Smoker   Smokeless tobacco: Never Used  Substance Use Topics   Alcohol use: No   Drug use: No     Allergies   Patient has no known allergies.   Review of Systems Review of Systems  Constitutional: Positive for chills. Negative for fever.  Gastrointestinal: Positive for rectal pain. Negative for abdominal pain, blood in stool, constipation, diarrhea, nausea and vomiting.  All other systems reviewed and are negative.    Physical Exam Updated Vital  Signs BP 110/64 (BP Location: Right Arm)    Pulse 81    Temp 99.5 F (37.5 C) (Oral)    Resp 17    SpO2 100%   Physical Exam Vitals signs and nursing note reviewed.  Constitutional:      General: She is not in acute distress.    Appearance: Normal appearance. She is well-developed and normal weight. She is not diaphoretic.  HENT:     Head: Normocephalic and atraumatic.     Mouth/Throat:     Mouth: Mucous membranes are moist.     Pharynx: Oropharynx is clear.  Eyes:     General:        Right eye: No discharge.        Left eye: No discharge.  Neck:     Musculoskeletal: Neck supple. No muscular tenderness.  Cardiovascular:     Rate and Rhythm: Normal rate and regular rhythm.     Pulses: Normal pulses.     Heart sounds: Normal heart sounds. No murmur. No friction rub. No gallop.   Pulmonary:     Effort: Pulmonary effort is normal. No respiratory distress.     Breath sounds: Normal breath sounds.     Comments: Respirations equal and unlabored, patient able to speak in full sentences, lungs clear to auscultation bilaterally Abdominal:     General: Abdomen is flat. Bowel sounds are normal. There is no distension.     Palpations: Abdomen is soft. There is no mass.     Tenderness: There is no abdominal tenderness. There is no guarding.     Comments: Abdomen soft, nondistended, nontender to palpation in all quadrants without guarding or peritoneal signs  Genitourinary:    Comments: Rectal exam deferred due to pain, since surgery has already seen and evaluated the patient and surgical plan in place Skin:    General: Skin is warm and dry.     Capillary Refill: Capillary refill takes less than 2 seconds.  Neurological:     Mental Status: She is alert and oriented to person, place, and time.     Coordination: Coordination normal.  Psychiatric:        Mood and Affect: Mood normal.        Behavior: Behavior normal.      ED Treatments / Results  Labs (all labs ordered are listed, but  only abnormal results are displayed) Labs Reviewed  SARS CORONAVIRUS 2 (HOSPITAL ORDER, [facility])  COMPREHENSIVE METABOLIC PANEL  CBC WITH DIFFERENTIAL/PLATELET    EKG None  Radiology Ct Pelvis W Contrast  Result Date: 01/14/2019 CLINICAL DATA:  63 year old female with a history of  possible abscess. History of Crohn's disease EXAM: CT PELVIS WITH CONTRAST TECHNIQUE: Multidetector CT imaging of the pelvis was performed using the standard protocol following the bolus administration of intravenous contrast. CONTRAST:  142m OMNIPAQUE IOHEXOL 300 MG/ML  SOLN COMPARISON:  November 17, 2018 FINDINGS: Urinary Tract:  Urinary bladder unremarkable. Bowel:  Visualized small bowel unremarkable.  Normal appendix. The cecum is unremarkable. Rim enhancing fluid collection adjacent to the rectum, inseparable from the wall of the rectum measures 3 cm on the axial images, approximately 4.8 cm in cranial caudal span on the coronal images. There is asymmetric wall thickening of the rectum on image 44 series 2, at the level of the abscess. Associated inflammatory changes in the perirectal fat. Small lymph nodes are present along the left perirectal fat. There is a lymph node adjacent to the rectum on image 33 of series 2 which measures 13 mm. This was present on the comparison CT of June 2020, and appears to have enlarged slightly in the interval. Vascular/Lymphatic: No significant atherosclerotic changes. Small lymph nodes within the fat in the perirectal region. Reproductive:  Hysterectomy Other:  None Musculoskeletal: No acute displaced fracture. IMPRESSION: Perirectal abscess, at the left posterior aspect of the rectum, inseparable from the wall of the colon and the adjacent pelvic musculature. Largest diameter on cranial caudal imaging measures 4.8 cm. Asymmetric thickening of the rectum associated with the abscess. A perforated rectal carcinoma is favored given the appearance, and correlation  with physical exam as well anoscopy/rigid sigmoidoscopy. Alternatively, the changes may reflect chronic inflammation in the setting of inflammatory bowel disease. There are several small lymph nodes within the perirectal fat and along the left pelvis, the largest measuring 13 mm, potentially pathologic in the setting of carcinoma, or alternatively reactive. Electronically Signed   By: JCorrie MckusickD.O.   On: 01/14/2019 16:05    Procedures Procedures (including critical care time)  Medications Ordered in ED Medications  piperacillin-tazobactam (ZOSYN) IVPB 3.375 g (has no administration in time range)     Initial Impression / Assessment and Plan / ED Course  I have reviewed the triage vital signs and the nursing notes.  Pertinent labs & imaging results that were available during my care of the patient were reviewed by me and considered in my medical decision making (see chart for details).  63year old female found to have a perirectal abscess on CT scan yesterday with her GI doctor, Dr. GLeroy Kennedy  Patient was sent to the ED for surgery, Dr. TMarlou Starkswith general surgery has already been consulted they will plan to see and admit the patient for surgery.  COVID swab and preop labs ordered, surgery requested Zosyn for preop antibiotics.    Surgery at bedside to see and admit the patient, plan is for surgery later today.  Final Clinical Impressions(s) / ED Diagnoses   Final diagnoses:  Perirectal abscess    ED Discharge Orders    None       FJanet Berlin08/28/20 1252    BMaudie Flakes MD 01/21/19 [phone]

## 2019-01-15 NOTE — ED Triage Notes (Signed)
Pt reports that she saw her GI doctor and was called and told to come to the ED this morning for surgery for the rectal mass that was found Wed at the office. Pt reports caused her pain for months. Has to take laxatives to have BMs.

## 2019-01-15 NOTE — Anesthesia Preprocedure Evaluation (Signed)
Anesthesia Evaluation  Patient identified by MRN, date of birth, ID band Patient awake    Reviewed: Allergy & Precautions, NPO status , Patient's Chart, lab work & pertinent test results  Airway Mallampati: I       Dental no notable dental hx. (+) Teeth Intact   Pulmonary neg pulmonary ROS,    Pulmonary exam normal breath sounds clear to auscultation       Cardiovascular hypertension, Pt. on medications Normal cardiovascular exam Rhythm:Regular Rate:Normal     Neuro/Psych negative neurological ROS  negative psych ROS   GI/Hepatic Neg liver ROS,   Endo/Other  negative endocrine ROS  Renal/GU negative Renal ROS  negative genitourinary   Musculoskeletal negative musculoskeletal ROS (+)   Abdominal Normal abdominal exam  (+)   Peds  Hematology  (+) Blood dyscrasia, anemia ,   Anesthesia Other Findings   Reproductive/Obstetrics                             Anesthesia Physical Anesthesia Plan  ASA: II  Anesthesia Plan: General   Post-op Pain Management:    Induction: Intravenous  PONV Risk Score and Plan: 4 or greater and Ondansetron, Dexamethasone and Midazolam  Airway Management Planned: LMA  Additional Equipment:   Intra-op Plan:   Post-operative Plan:   Informed Consent: I have reviewed the patients History and Physical, chart, labs and discussed the procedure including the risks, benefits and alternatives for the proposed anesthesia with the patient or authorized representative who has indicated his/her understanding and acceptance.     Dental advisory given  Plan Discussed with: CRNA  Anesthesia Plan Comments:         Anesthesia Quick Evaluation

## 2019-01-16 LAB — BASIC METABOLIC PANEL
Anion gap: 10 (ref 5–15)
BUN: 19 mg/dL (ref 6–20)
CO2: 25 mmol/L (ref 22–32)
Calcium: 9.2 mg/dL (ref 8.9–10.3)
Chloride: 101 mmol/L (ref 98–111)
Creatinine, Ser: 0.86 mg/dL (ref 0.44–1.00)
GFR calc Af Amer: >60 mL/min (ref >60)
GFR calc non Af Amer: >60 mL/min (ref >60)
Glucose, Bld: 133 mg/dL — ABNORMAL HIGH (ref 70–99)
Potassium: 4.2 mmol/L (ref 3.5–5.1)
Sodium: 136 mmol/L (ref 135–145)

## 2019-01-16 LAB — CBC
HCT: 26 % — ABNORMAL LOW (ref 36.0–46.0)
Hemoglobin: 8.3 g/dL — ABNORMAL LOW (ref 12.0–15.0)
MCH: 28.5 pg (ref 26.0–34.0)
MCHC: 31.9 g/dL (ref 30.0–36.0)
MCV: 89.3 fL (ref 80.0–100.0)
Platelets: 293 10*3/uL (ref 150–400)
RBC: 2.91 MIL/uL — ABNORMAL LOW (ref 3.87–5.11)
RDW: 12.6 % (ref 11.5–15.5)
WBC: 13.5 10*3/uL — ABNORMAL HIGH (ref 4.0–10.5)
nRBC: 0 % (ref 0.0–0.2)

## 2019-01-16 LAB — HIV ANTIBODY (ROUTINE TESTING W REFLEX): HIV Screen 4th Generation wRfx: NONREACTIVE

## 2019-01-16 MED ORDER — LACTATED RINGERS IV BOLUS
500.0000 mL | Freq: Once | INTRAVENOUS | Status: AC
  Administered 2019-01-16: 500 mL via INTRAVENOUS

## 2019-01-16 NOTE — Plan of Care (Signed)
Patient sitting up in bed; ready to ambulate in hall. No other needs expressed. Will continue to monitor.

## 2019-01-16 NOTE — Progress Notes (Signed)
Subjective No acute events. Feeling better today relative to preop. Denies bm since OR. Denies pain.  Objective: Vital signs in last 24 hours: Temp:  [98 F (36.7 C)-99.7 F (37.6 C)] 98 F (36.7 C) (08/29 0430) Pulse Rate:  [57-82] 57 (08/29 0430) Resp:  [10-18] 16 (08/29 0430) BP: (91-121)/(58-85) 107/62 (08/29 0430) SpO2:  [99 %-100 %] 99 % (08/29 0430) Weight:  [67 kg] 67 kg (08/28 1507) Last BM Date: 01/11/19  Intake/Output from previous day: 08/28 0701 - 08/29 0700 In: 1264 [I.V.:1214; IV Piggyback:50] Out: 150 [Blood:150] Intake/Output this shift: No intake/output data recorded.  Gen: NAD, comfortable CV: RRR Pulm: Normal work of breathing Abd: Soft, NT/ND Ext: SCDs in place  Lab Results: CBC  Recent Labs    01/15/19 1110 01/16/19 0312  WBC 12.2* 13.5*  HGB 9.2* 8.3*  HCT 29.2* 26.0*  PLT 323 293   BMET Recent Labs    01/15/19 1110 01/16/19 0312  NA 135 136  K 3.9 4.2  CL 99 101  CO2 28 25  GLUCOSE 106* 133*  BUN 12 19  CREATININE 0.66 0.86  CALCIUM 9.8 9.2   PT/INR No results for input(s): LABPROT, INR in the last 72 hours. ABG No results for input(s): PHART, HCO3 in the last 72 hours.  Invalid input(s): PCO2, PO2  Studies/Results:  Anti-infectives: Anti-infectives (From admission, onward)   Start     Dose/Rate Route Frequency Ordered Stop   01/15/19 1830  piperacillin-tazobactam (ZOSYN) IVPB 3.375 g     3.375 g 12.5 mL/hr over 240 Minutes Intravenous Every 8 hours 01/15/19 1105     01/15/19 1400  piperacillin-tazobactam (ZOSYN) IVPB 3.375 g  Status:  Discontinued     3.375 g 12.5 mL/hr over 240 Minutes Intravenous Every 8 hours 01/15/19 1104 01/15/19 1105   01/15/19 1100  piperacillin-tazobactam (ZOSYN) IVPB 3.375 g     3.375 g 100 mL/hr over 30 Minutes Intravenous  Once 01/15/19 1046 01/15/19 1240       Assessment/Plan: Patient Active Problem List   Diagnosis Date Noted  . Perirectal abscess 01/15/2019  . Rectal pain  01/13/2019  . Rectal mass 01/13/2019  . IBD (inflammatory bowel disease) - colitis 01/11/2018  . Hyperlipidemia 10/25/2017  . Hyperglycemia 10/25/2017  . Essential hypertension, benign 10/24/2017   s/p Procedure(s): INTERNAL DRAINAGE OF PERIRECTAL ABSCESS 01/15/2019; possible mass as well which was biopsied  -Continue IV abx today -Feeling well; advance to soft diet -Discussed OR findings with her today; pathology is also pending -Possible discharge tomorrow if continues to do well - likely 7d course of abx (augmentin or cipro/flagyl)   LOS: 1 day   Sharon Mt. Dema Severin, M.D. [facility]

## 2019-01-17 LAB — CBC WITH DIFFERENTIAL/PLATELET
Abs Immature Granulocytes: 0.06 10*3/uL (ref 0.00–0.07)
Basophils Absolute: 0 10*3/uL (ref 0.0–0.1)
Basophils Relative: 0 %
Eosinophils Absolute: 0 10*3/uL (ref 0.0–0.5)
Eosinophils Relative: 1 %
HCT: 24.3 % — ABNORMAL LOW (ref 36.0–46.0)
Hemoglobin: 7.6 g/dL — ABNORMAL LOW (ref 12.0–15.0)
Immature Granulocytes: 1 %
Lymphocytes Relative: 29 %
Lymphs Abs: 2.5 10*3/uL (ref 0.7–4.0)
MCH: 27.8 pg (ref 26.0–34.0)
MCHC: 31.3 g/dL (ref 30.0–36.0)
MCV: 89 fL (ref 80.0–100.0)
Monocytes Absolute: 0.6 10*3/uL (ref 0.1–1.0)
Monocytes Relative: 7 %
Neutro Abs: 5.4 10*3/uL (ref 1.7–7.7)
Neutrophils Relative %: 62 %
Platelets: 307 10*3/uL (ref 150–400)
RBC: 2.73 MIL/uL — ABNORMAL LOW (ref 3.87–5.11)
RDW: 12.7 % (ref 11.5–15.5)
WBC: 8.7 10*3/uL (ref 4.0–10.5)
nRBC: 0 % (ref 0.0–0.2)

## 2019-01-17 LAB — CK TOTAL AND CKMB (NOT AT ARMC)
CK, MB: 1 ng/mL (ref 0.5–5.0)
Relative Index: INVALID (ref 0.0–2.5)
Total CK: 65 U/L (ref 38–234)

## 2019-01-17 LAB — TROPONIN I (HIGH SENSITIVITY): Troponin I (High Sensitivity): <2 ng/L (ref <18)

## 2019-01-17 MED ORDER — AMOXICILLIN-POT CLAVULANATE 875-125 MG PO TABS
1.0000 | ORAL_TABLET | Freq: Two times a day (BID) | ORAL | Status: DC
  Administered 2019-01-17: 1 via ORAL
  Filled 2019-01-17: qty 1

## 2019-01-17 MED ORDER — AMOXICILLIN-POT CLAVULANATE 875-125 MG PO TABS: 1.0000 | ORAL_TABLET | Freq: Two times a day (BID) | ORAL | 0 refills | Status: AC

## 2019-01-17 MED ORDER — TRAMADOL HCL 50 MG PO TABS: 50.0000 mg | ORAL_TABLET | Freq: Four times a day (QID) | ORAL | 0 refills | Status: AC | PRN

## 2019-01-17 NOTE — Plan of Care (Signed)
Patient lying in bed this morning; had a bad night and is not feeling the best this morning. States she will request pain medication when she needs it. Will continue to monitor.

## 2019-01-17 NOTE — Progress Notes (Signed)
Pt began experiencing chills and sudden left sided chest pain that woke her up from sleep. She described it as "constant dull tightness" that was 7/10.  Vitals were taken and morphine administered. Morphine brought pain down to 5/10 and pt stated it moved to the center of her back and was throbbing. On-Call MD Annye English notified. MD ordered cardiac enzymes and EKG. Will continue to monitor.

## 2019-01-17 NOTE — Discharge Summary (Signed)
Patient ID: Sammantha Mehlhaff MRN: [id number] DOB/AGE: 61-Apr-1957 61 y.o.  Admit date: 01/15/2019 Discharge date: 01/17/2019  Discharge Diagnoses Patient Active Problem List   Diagnosis Date Noted  . Perirectal abscess 01/15/2019  . Rectal pain 01/13/2019  . Rectal mass 01/13/2019  . IBD (inflammatory bowel disease) - colitis 01/11/2018  . Hyperlipidemia 10/25/2017  . Hyperglycemia 10/25/2017  . Essential hypertension, benign 10/24/2017    Procedures OR 01/15/2019 for drainage of rectal wall abscess, biopsy of mass in rectu  Hospital Course: She was admitted postoperatively where she recovered reasonably well. On 8/30 she had a short course of chest pain. Her EKG and cardiac enzymes did not reveal an acute coronary event. This resolved. She began having bowel function without bleeding. She was tolerating a diet, afebrile and WBC normalized. On 01/17/2019, she was transitioned to PO antibiotics and deemed stable for discharge home. Her pathology is pending   Allergies as of 01/17/2019   No Known Allergies     Medication List    TAKE these medications   amoxicillin-clavulanate 875-125 MG tablet Commonly known as: Augmentin Take 1 tablet by mouth 2 (two) times daily for 5 days.   aspirin 81 MG tablet Take 81 mg by mouth daily.   ferrous sulfate 325 (65 FE) MG EC tablet Take 650 mg by mouth daily with breakfast.   lisinopril-hydrochlorothiazide 20-25 MG tablet Commonly known as: ZESTORETIC Take 1 tablet by mouth daily.   mesalamine 1.2 g EC tablet Commonly known as: LIALDA TAKE 2 TABLETS (2.4 G TOTAL) BY MOUTH DAILY WITH BREAKFAST.   naproxen sodium 220 MG tablet Commonly known as: ALEVE Take 220 mg by mouth daily as needed (pain).   ONE-A-DAY WOMENS PO Take 1 tablet by mouth daily.   traMADol 50 MG tablet Commonly known as: Ultram Take 1 tablet (50 mg total) by mouth every 6 (six) hours as needed for up to 7 days (postop pain not controlled with  tylenol/ibuprofen). What changed:   when to take this  reasons to take this        Follow-up Information    Jovita Kussmaul, MD. Call in 2 week(s).   Specialty: General Surgery Contact information: [address] [place] [address] [phone]           Sharon Mt. Dema Severin, M.D. [facility]

## 2019-01-17 NOTE — Discharge Instructions (Signed)
ANORECTAL SURGERY: POST OP INSTRUCTIONS  1. DIET: As tolerated.    2. Take your usually prescribed home medications unless otherwise directed.  3. PAIN CONTROL: a. It is helpful to take an over-the-counter pain medication regularly for the first few days/weeks.  Choose from the following that works best for you: i. Ibuprofen (Advil, etc) Three 266m tabs every 6 hours as needed. ii. Acetaminophen (Tylenol, etc) 500-6571mevery 6 hours as needed iii. NOTE: You may take both of these medications together - most patients find it most helpful when alternating between the two (i.e. Ibuprofen at 6am, tylenol at 9am, ibuprofen at 12pm ...) b. A  prescription for pain medication may have been prescribed for you at discharge.  Take your pain medication as prescribed.  i. If you are having problems/concerns with the prescription medicine, please call us[place]oreaor further advice.  4. Avoid getting constipated.  Between the surgery and the pain medications, it is common to experience some constipation.  Increasing fluid intake (64oz of water per day) and taking a fiber supplement (such as Metamucil, Citrucel, FiberCon) 1-2 times a day regularly will usually help prevent this problem from occurring.  Take Miralax (over the counter) 1-2x/day while taking a narcotic pain medication. If no bowel movement after 48hours, you may additionally take a laxative like a bottle of Milk of Magnesia which can be purchased over the counter. Avoid enemas if possible as these are often painful.   5. Watch out for diarrhea.  If you have many loose bowel movements, simplify your diet to bland foods.  Stop any stool softeners and decrease your fiber supplement. If this worsens or does not improve, please call us[place]orea 6. Soaking in a warm bath filled a couple inches ("Sitz bath") is a great way to clean the area after a bowel movement and many patients find it is a way to soothe the area.  7. ACTIVITIES as tolerated:   a. You may resume  regular (light) daily activities beginning the next day--such as daily self-care, walking, climbing stairs--gradually increasing activities as tolerated.  If you can walk 30 minutes without difficulty, it is safe to try more intense activity such as jogging, treadmill, bicycling, low-impact aerobics, etc. b. Refrain from any heavy lifting or straining for the first 2 weeks after your procedure, particularly if your surgery was for hemorrhoids. c. Avoid activities that make your pain worse d. You may drive when you are no longer taking prescription pain medication, you can comfortably wear a seatbelt, and you can safely maneuver your car and apply brakes.  8. FOLLOW UP in our office a. Please call [facility] at [phone] to set up an appointment to see your surgeon in the office for a follow-up appointment approximately 2 weeks after your surgery. b. Make sure that you call for this appointment the day you arrive home to insure a convenient appointment time.  9. If you have disability or family leave forms that need to be completed, you may have them completed by your primary care physician's office; for return to work instructions, please ask our office staff and they will be happy to assist you in obtaining this documentation   When to call [phone]. Poor pain control 2. Reactions / problems with new medications (rash/itching, etc)  3. Fever over 101.5 F (38.5 C) 4. Inability to urinate 5. Nausea/vomiting 6. Worsening swelling or bruising 7. Continued bleeding from incision. 8. Increased pain, redness, or drainage from the incision  The  clinic staff is available to answer your questions during regular business hours (8:30am-5pm).  Please dont hesitate to call and ask to speak to one of our nurses for clinical concerns.   A surgeon from [facility] is always on call at the hospitals   If you have a medical emergency, go to the nearest emergency room or call  911.   [facility], [place], [place], [place], [place]  [address] ? MAIN: [phone] FAX [phone] [email or web address]

## 2019-01-17 NOTE — Progress Notes (Addendum)
Subjective Had short course of chest pain last night - EKG and enzymes did not show signs of ACS. Had headache last night too. BM this morning - denies blood in stool. Rectal discomfort stable. Denies chest pain or SOB this morning.  Objective: Vital signs in last 24 hours: Temp:  [98.2 F (36.8 C)-98.8 F (37.1 C)] 98.7 F (37.1 C) (08/30 0600) Pulse Rate:  [55-105] 78 (08/30 0600) Resp:  [17-18] 18 (08/30 0600) BP: (86-145)/(65-81) 129/81 (08/30 0600) SpO2:  [96 %-100 %] 96 % (08/30 0600) Last BM Date: 01/11/19  Intake/Output from previous day: 08/29 0701 - 08/30 0700 In: 2405.8 [P.O.:1560; I.V.:694.6; IV Piggyback:151.2] Out: 1400 [Urine:1400] Intake/Output this shift: No intake/output data recorded.  Gen: NAD, comfortable CV: RRR Pulm: Normal work of breathing; lungs are ctab Abd: Soft, NT/ND Ext: SCDs in place  Lab Results: CBC  Recent Labs    01/16/19 0312 01/17/19 0431  WBC 13.5* 8.7  HGB 8.3* 7.6*  HCT 26.0* 24.3*  PLT 293 307   BMET Recent Labs    01/15/19 1110 01/16/19 0312  NA 135 136  K 3.9 4.2  CL 99 101  CO2 28 25  GLUCOSE 106* 133*  BUN 12 19  CREATININE 0.66 0.86  CALCIUM 9.8 9.2   PT/INR No results for input(s): LABPROT, INR in the last 72 hours. ABG No results for input(s): PHART, HCO3 in the last 72 hours.  Invalid input(s): PCO2, PO2  Studies/Results:  Anti-infectives: Anti-infectives (From admission, onward)   Start     Dose/Rate Route Frequency Ordered Stop   01/15/19 1830  piperacillin-tazobactam (ZOSYN) IVPB 3.375 g     3.375 g 12.5 mL/hr over 240 Minutes Intravenous Every 8 hours 01/15/19 1105     01/15/19 1400  piperacillin-tazobactam (ZOSYN) IVPB 3.375 g  Status:  Discontinued     3.375 g 12.5 mL/hr over 240 Minutes Intravenous Every 8 hours 01/15/19 1104 01/15/19 1105   01/15/19 1100  piperacillin-tazobactam (ZOSYN) IVPB 3.375 g     3.375 g 100 mL/hr over 30 Minutes Intravenous  Once 01/15/19 1046 01/15/19 1240        Assessment/Plan: Patient Active Problem List   Diagnosis Date Noted  . Perirectal abscess 01/15/2019  . Rectal pain 01/13/2019  . Rectal mass 01/13/2019  . IBD (inflammatory bowel disease) - colitis 01/11/2018  . Hyperlipidemia 10/25/2017  . Hyperglycemia 10/25/2017  . Essential hypertension, benign 10/24/2017   s/p Procedure(s): INTERNAL DRAINAGE OF PERIRECTAL ABSCESS 01/15/2019; possible mass as well which was biopsied  -Transition to PO abx to complete 7dcourse - will plan Augmentin -Diet as tolerated -Pathology is also pending -Plan for discharge home today - complete 7d course of abx   LOS: 2 days   Sharon Mt. Dema Severin, M.D. [facility]

## 2019-01-18 ENCOUNTER — Encounter (HOSPITAL_COMMUNITY): Payer: Self-pay | Admitting: General Surgery

## 2019-01-19 ENCOUNTER — Encounter: Payer: Self-pay | Admitting: Internal Medicine

## 2019-01-20 ENCOUNTER — Encounter: Payer: BC Managed Care – PPO | Admitting: Internal Medicine

## 2019-01-22 ENCOUNTER — Telehealth: Payer: Self-pay | Admitting: Internal Medicine

## 2019-01-22 NOTE — Telephone Encounter (Signed)
I called her, she had spoken to Dr. 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  Dr. Marlou Starks is setting up oncology appointments and f/u colorectal surgery (Dr. Dema Severin)  She was appreciative of me directing her to Dr. Marlou Starks last week and is feeling better after the abscess drainage.

## 2019-01-29 ENCOUNTER — Other Ambulatory Visit (HOSPITAL_COMMUNITY): Payer: Self-pay | Admitting: Surgery

## 2019-01-29 DIAGNOSIS — C20 Malignant neoplasm of rectum: Secondary | ICD-10-CM

## 2019-02-01 ENCOUNTER — Telehealth: Payer: Self-pay | Admitting: Hematology

## 2019-02-01 NOTE — Progress Notes (Signed)
[place]   Telephone:([phone]) [phone] Fax:([phone]) [place] Note   Patient Care Team: Harrison Mons, [place] as PCP - General (Family Medicine)  Date of Service:  02/04/2019   CHIEF COMPLAINTS/PURPOSE OF CONSULTATION:  Newly diagnosed rectal cancer  REFERRING PHYSICIAN:  Dr Marlou Starks  Oncology History Overview Note  Cancer Staging Squamous cell carcinoma of rectum [facility]) Staging form: Colon and Rectum, AJCC 8th Edition - Clinical stage from 02/04/2019: Stage IIIA (cT2, cN1a, cM0) - Signed by Truitt Merle, MD on 02/04/2019     Squamous cell carcinoma of rectum ([place])  01/13/2019 Initial Diagnosis   Squamous cell carcinoma of rectum ([place])   01/14/2019 Imaging   CT Pelvis W Contrast 01/14/19 IMPRESSION: Perirectal abscess, at the left posterior aspect of the rectum, inseparable from the wall of the colon and the adjacent pelvic musculature. Largest diameter on cranial caudal imaging measures 4.8 cm.   Asymmetric thickening of the rectum associated with the abscess. A perforated rectal carcinoma is favored given the appearance, and correlation with physical exam as well anoscopy/rigid sigmoidoscopy. Alternatively, the changes may reflect chronic inflammation in the setting of inflammatory bowel disease.   There are several small lymph nodes within the perirectal fat and along the left pelvis, the largest measuring 13 mm, potentially pathologic in the setting of carcinoma, or alternatively reactive.   01/15/2019 Initial Diagnosis   Diagnosis 01/15/19 Soft tissue, abscess, rectal wall - INVASIVE SQUAMOUS CELL CARCINOMA, MODERATELY TO POORLY DIFFERENTIATED.   02/03/2019 Imaging   CT Chest W Contrast 02/03/19  IMPRESSION: 1. Two nodules each measuring about 4 mm in long axis noted in the right upper lobe. One of these is subpleural. These are likely benign but given the context, surveillance imaging in 6-12 months time should be considered. Fleischner  criteria for follow do not directly apply given the history of malignancy.   02/04/2019 Cancer Staging   Staging form: Colon and Rectum, AJCC 8th Edition - Clinical stage from 02/04/2019: Stage IIIA (cT2, cN1a, cM0) - Signed by Truitt Merle, MD on 02/04/2019      HISTORY OF PRESENTING ILLNESS:  Leah Bailey 63 y.o. female is a here because of newly diagnosed rectal cancer. The patient was referred by Dr Marlou Starks. The patient presents to the clinic today alone.  She has been having rectal pain and lower abdominal cramps for the past 1-2 months. It become significant and she went for pelvic exam. She had a perirectal cyst which she went to have removed. During surgery she would found to have cancer. She plans to see Dr Dema Severin Monday. She completed antibiotics for her abscess.   She notes her rectal pain is the main pain since surgery. It is not constant but will occur at 10/10 and last for a few hours then dissipated. This happens about 3 times a day. She also notes eating does exacerbate this pain. She also has pain with BMs. She has been on Tramadol for her pain but this is not controlling her pain currently.  She also has mild rectal bleeding with BMs. She denies chest, abdominal pain or nausea. She notes mild HA and SOB upon activity. She feels she is starting to spend more time in bed but is usually very active. She notes she was on a high fiber low carb diet with mostly cooked vegetables. With this diet she has lost 10 pounds in the last month.   Socially she is married. She has 2 adult sons. She works as a  CNA at [employer]. She is a non smoker but her husband smokes.She does not drink anymore.  They have a PMHx of HTN on lisinopril. She notes she has always had mild anemia so she is on oral iron.  She had hysterectomy in 1988 due to bleeding and fibroid tumor. She has tubal ligation. She has benign breast cyst 30 years ago. Her mother and cousin had breast cancer.    REVIEW OF SYSTEMS:      Constitutional: Denies fevers, chills or abnormal night sweats (+) fatigue  Eyes: Denies blurriness of vision, double vision or watery eyes Ears, nose, mouth, throat, and face: Denies mucositis or sore throat Respiratory: Denies cough or wheezes (+) SOB  Cardiovascular: Denies palpitation, chest discomfort or lower extremity swelling Gastrointestinal:  Denies nausea, heartburn (+) GI bleeding (+) rectal bleeding  Skin: Denies abnormal skin rashes Lymphatics: Denies new lymphadenopathy or easy bruising Neurological:Denies numbness, tingling or new weaknesses Behavioral/Psych: Mood is stable, no new changes  All other systems were reviewed with the patient and are negative.   MEDICAL HISTORY:  Past Medical History:  Diagnosis Date   Herpes simplex disciform keratitis 10/20/2013   Hypertension    IBD (inflammatory bowel disease) - colitis 01/11/2018   Nuclear sclerosis of both eyes 11/30/2014   Squamous cell carcinoma of rectum ([place]) 01/13/2019   UC (ulcerative colitis) ([place])     SURGICAL HISTORY: Past Surgical History:  Procedure Laterality Date   ABDOMINAL HYSTERECTOMY     BREAST CYST ASPIRATION Left 1983   COLONOSCOPY     INCISION AND DRAINAGE PERIRECTAL ABSCESS N/A 01/15/2019   Procedure: INTERNAL DRAINAGE OF PERIRECTAL ABSCESS;  Surgeon: Jovita Kussmaul, MD;  Location: [facility];  Service: General;  Laterality: N/A;   TUBAL LIGATION      SOCIAL HISTORY: Social History   Socioeconomic History   Marital status: Married    Spouse name: Not on file   Number of children: 2   Years of education: Not on file   Highest education level: 12th grade  Occupational History   Occupation: CNA    Comment: Health and safety inspector strain: Not hard at all   Food insecurity    Worry: Never true    Inability: Never true   Transportation needs    Medical: No    Non-medical: No  Tobacco Use   Smoking status: Never Smoker   Smokeless tobacco:  Never Used  Substance and Sexual Activity   Alcohol use: No   Drug use: No   Sexual activity: Yes    Partners: Male    Birth control/protection: Post-menopausal  Lifestyle   Physical activity    Days per week: 3 days    Minutes per session: 150+ min   Stress: Only a little  Relationships   Social connections    Talks on phone: More than three times a week    Gets together: Once a week    Attends religious service: More than 4 times per year    Active member of club or organization: Yes    Attends meetings of clubs or organizations: More than 4 times per year    Relationship status: Married   Intimate partner violence    Fear of current or ex partner: No    Emotionally abused: No    Physically abused: No    Forced sexual activity: No  Other Topics Concern   Not on file  Social History Narrative   Lives with her husband.  She is a Quarry manager at [employer]   Their adult sons live nearby.  2 sons   No alcohol tobacco or drug use    FAMILY HISTORY: Family History  Problem Relation Age of Onset   Diabetes Mother    Breast cancer Mother 29   Heart attack Father    Heart disease Father    Diabetes Sister    Diabetes Brother    Diabetes Sister    Healthy Son    Healthy Son    Diabetes Brother    Breast cancer Cousin    Colon cancer Neg Hx    Esophageal cancer Neg Hx    Stomach cancer Neg Hx    Rectal cancer Neg Hx     ALLERGIES:  has No Known Allergies.  MEDICATIONS:  Current Outpatient Medications  Medication Sig Dispense Refill   aspirin 81 MG tablet Take 81 mg by mouth daily.     ferrous sulfate 325 (65 FE) MG EC tablet Take 650 mg by mouth daily with breakfast.      lisinopril-hydrochlorothiazide (PRINZIDE,ZESTORETIC) 20-25 MG tablet Take 1 tablet by mouth daily. 90 tablet 3   mesalamine (LIALDA) 1.2 g EC tablet TAKE 2 TABLETS (2.4 G TOTAL) BY MOUTH DAILY WITH BREAKFAST. 180 tablet 3   Multiple Vitamins-Calcium (ONE-A-DAY WOMENS PO) Take 1  tablet by mouth daily.     naproxen sodium (ALEVE) 220 MG tablet Take 220 mg by mouth daily as needed (pain).      traMADol (ULTRAM) 50 MG tablet Take 1 tablet (50 mg total) by mouth every 6 (six) hours as needed. 60 tablet 0   No current facility-administered medications for this visit.     PHYSICAL EXAMINATION: ECOG PERFORMANCE STATUS: 1 - Symptomatic but completely ambulatory  Vitals:   02/04/19 1529  BP: 115/73  Pulse: 70  Resp: 18  Temp: 98.3 F (36.8 C)  SpO2: 100%   Filed Weights   02/04/19 1529  Weight: 146 lb (66.2 kg)    GENERAL:alert, no distress and comfortable SKIN: skin color, texture, turgor are normal, no rashes or significant lesions EYES: normal, Conjunctiva are pink and non-injected, sclera clear  NECK: supple, thyroid normal size, non-tender, without nodularity LYMPH:  no palpable lymphadenopathy in the cervical, axillary  LUNGS: clear to auscultation and percussion with normal breathing effort HEART: regular rate & rhythm and no murmurs and no lower extremity edema (+) Pulmonary artery spasm ABDOMEN:abdomen soft, non-tender and normal bowel sounds Musculoskeletal:no cyanosis of digits and no clubbing  NEURO: alert & oriented x 3 with fluent speech, no focal motor/sensory deficits RECTAL: Internal rectal exam deferred today. External exam normal.   LABORATORY DATA:  I have reviewed the data as listed CBC Latest Ref Rng & Units 02/04/2019 01/17/2019 01/16/2019  WBC 4.0 - 10.5 K/uL 4.9 8.7 13.5(H)  Hemoglobin 12.0 - 15.0 g/dL 9.9(L) 7.6(L) 8.3(L)  Hematocrit 36.0 - 46.0 % 30.9(L) 24.3(L) 26.0(L)  Platelets 150 - 400 K/uL 378 307 293    CMP Latest Ref Rng & Units 02/04/2019 01/16/2019 01/15/2019  Glucose 70 - 99 mg/dL 104(H) 133(H) 106(H)  BUN 6 - 20 mg/dL 10 19 12   Creatinine 0.44 - 1.00 mg/dL 0.83 0.86 0.66  Sodium 135 - 145 mmol/L 140 136 135  Potassium 3.5 - 5.1 mmol/L 4.4 4.2 3.9  Chloride 98 - 111 mmol/L 104 101 99  CO2 22 - 32 mmol/L 29 25 28     Calcium 8.9 - 10.3 mg/dL 11.4(H) 9.2 9.8  Total Protein 6.5 - 8.1  g/dL 7.6 - 8.1  Total Bilirubin 0.3 - 1.2 mg/dL 0.2(L) - 0.7  Alkaline Phos 38 - 126 U/L 70 - 71  AST 15 - 41 U/L 13(L) - 14(L)  ALT 0 - 44 U/L 10 - 16    Colonoscopy by Dr. Carlean Purl 01/06/18  IMPRESSION - Ulcerated mucosa in the entire examined colon. Biopsied. - Diverticulosis in the sigmoid colon. - The examination was otherwise normal on direct and retroflexion views. - The examined portion of the ileum was normal. Diagnosis Surgical [P], right colon, left colon - ACTIVE COLITIS WITH MILD CHRONIC CHANGES. - SEE MICROSCOPIC DESCRIPTION.   RADIOGRAPHIC STUDIES: I have personally reviewed the radiological images as listed and agreed with the findings in the report. Ct Chest W Contrast  Result Date: 02/04/2019 CLINICAL DATA:  Squamous cell carcinoma involving the rectal wall. History of inflammatory bowel disease. EXAM: CT CHEST WITH CONTRAST TECHNIQUE: Multidetector CT imaging of the chest was performed during intravenous contrast administration. CONTRAST:  60m OMNIPAQUE IOHEXOL 300 MG/ML  SOLN COMPARISON:  Overlapping portions of CT abdomen from 11/17/2018 FINDINGS: Cardiovascular: Unremarkable Mediastinum/Nodes: Unremarkable Lungs/Pleura: 4 mm sub solid nodule of the right upper lobe on image 33/7. 0.4 by 0.3 cm subpleural nodule in the right upper lobe anteriorly on image 77/7. Upper Abdomen: Unremarkable where included. Musculoskeletal: Unremarkable IMPRESSION: 1. Two nodules each measuring about 4 mm in long axis noted in the right upper lobe. One of these is subpleural. These are likely benign but given the context, surveillance imaging in 6-12 months time should be considered. Fleischner criteria for follow do not directly apply given the history of malignancy. Electronically Signed   By: WVan ClinesM.D.   On: 02/04/2019 08:38   Ct Pelvis W Contrast  Result Date: 01/14/2019 CLINICAL DATA:  63year old  female with a history of possible abscess. History of Crohn's disease EXAM: CT PELVIS WITH CONTRAST TECHNIQUE: Multidetector CT imaging of the pelvis was performed using the standard protocol following the bolus administration of intravenous contrast. CONTRAST:  1055mOMNIPAQUE IOHEXOL 300 MG/ML  SOLN COMPARISON:  November 17, 2018 FINDINGS: Urinary Tract:  Urinary bladder unremarkable. Bowel:  Visualized small bowel unremarkable.  Normal appendix. The cecum is unremarkable. Rim enhancing fluid collection adjacent to the rectum, inseparable from the wall of the rectum measures 3 cm on the axial images, approximately 4.8 cm in cranial caudal span on the coronal images. There is asymmetric wall thickening of the rectum on image 44 series 2, at the level of the abscess. Associated inflammatory changes in the perirectal fat. Small lymph nodes are present along the left perirectal fat. There is a lymph node adjacent to the rectum on image 33 of series 2 which measures 13 mm. This was present on the comparison CT of June 2020, and appears to have enlarged slightly in the interval. Vascular/Lymphatic: No significant atherosclerotic changes. Small lymph nodes within the fat in the perirectal region. Reproductive:  Hysterectomy Other:  None Musculoskeletal: No acute displaced fracture. IMPRESSION: Perirectal abscess, at the left posterior aspect of the rectum, inseparable from the wall of the colon and the adjacent pelvic musculature. Largest diameter on cranial caudal imaging measures 4.8 cm. Asymmetric thickening of the rectum associated with the abscess. A perforated rectal carcinoma is favored given the appearance, and correlation with physical exam as well anoscopy/rigid sigmoidoscopy. Alternatively, the changes may reflect chronic inflammation in the setting of inflammatory bowel disease. There are several small lymph nodes within the perirectal fat and along the left pelvis, the  largest measuring 13 mm, potentially  pathologic in the setting of carcinoma, or alternatively reactive. Electronically Signed   By: Corrie Mckusick D.O.   On: 01/14/2019 16:05    ASSESSMENT & PLAN:  Immaculate Crutcher is a 63 y.o. African American female with a history of HTN, IBD, ulcerative colitis   1. Squamous cell carcinoma of rectum   -I reviewed her image findings and biopsy results with patient in great detail. Her CT scans shows low rectal wall thickening with abcess and surrounding lymphadenopathy, largest up to 63m. Chest scan also shows 2 tiny 436mright lung nodules which are likely benign.  -To better evaluate her cancer and complete staging, PET scan is recommended and ordered by Dr. MoLisbeth Renshawoday. She is agreeable.  -I discussed based on her size and appearance of tumor on scan, her cancer appears to be locally advanced. -We discussed that the treatment of rectal squamous cell carcinoma is similar to anal cancer.  Follow cardiovascular disease, standard therapy is definitive concurrent chemoRT with Mitomycin and 5FU M-F on week 1 and 5.  We discussed the rate of cure is high, and the surgery is reserved for residual or recurrent disease after chemoradiation. --Chemotherapy consent: Side effects including but does not not limited to, fatigue, nausea, vomiting, diarrhea, hair loss, neuropathy, fluid retention, renal and kidney dysfunction, neutropenic fever, needed for blood transfusion, bleeding, small risk of coronary artery spasm and heart attack, heart failure, etc were discussed with patient in great detail. She agrees to proceed. -The goal of therapy is curative. -I discussed cancer surveillance after she completes definitive chemo RT.  We would monitor her cancer with scan 3 months after ChemoRT to evaluate response first. If she achieves complete response, then we will see her every 3-6 months for 5 years, and surveillance CT scan every 6 to 12 months for first 3 years  -Will proceed with chemo class next week and plan  to start treatment in the next few weeks.  -Physical exam unremarkable except Pulmonary artery spasm. Internal rectal exam deferred today. Labs today reviewed, CBC and CMP WNL except Hg 9.9, BG 104, Ca 11.4, AST 13. Iron panel still pending.  -She has been on high fiber, low carb diet and eating less with diet. She has lost 10 pounds in the last month. I encouraged her to increase food intake with higher calorie and protein. I will refer her to our dietitian. She is agreeable.  -F/u open   2. Anemia, GI Bleeding  -Per patient she has had chronic mildly low anemia. She has been on oral ferrous sulfate.  -This has worsened lately secondary to her cancer and GI bleeding -She is symptomatic with SOB and fatigue.  -Hg 7.6 on 8/30. Repeat labs today show Hg 9.9. Iron panel still pending.    3. Rectal Pain  -Secondary to #1  -Pain not constant but can get up to 10/10. Is exacerbated by eating and BMs.  -On tramadol currently but not controlling her pain. I will increase to 5051m-2 tabs q6hours (02/04/19) -I discussed as she starts intensive treatment her rectal pain will worsen. She will likely require stronger pain medication. I discussed longterm narcotics use can cause addiction so we will monitor her use very closely. Will give her enough to control her pain, then reduce or start to wean her off once pain improves. She understands.  -I discussed pain medication can cause constipation. I encouraged her to keep her stool soft with prophylactic stool softener or laxatives.  4. Ulcerative Colitis/IBD, Diverticulosis   -Found incidentally on 12/2017 screening colonoscopy  -On Lialda given by Dr Carlean Purl  -Given she is asymptomatic and Lialda can lead to low blood counts, I will speak with Dr. Carlean Purl about stopping this medication.    5. HTN -On Lisinopril, will continue -will monitor her BP during chemoRT, and adjust her meds as needed     6. Hypercalcemia -Ca at 11.4 today  (02/04/19) -Given prior bone density years ago she was previously on oral calcium which she stopped 1 month ago.  -I encouraged her to drink plenty of water.  -Will check PTH, if normal or high, then this could be secondary to her cancer.  -If calcium>12, will consider zometa     PLAN:  -Send dietician referral  -I refilled tramadol and increased to q8h PRN  -Chemo education class in 1 week -PET scan in 1-2 weeks ordered by rad/onc -PICC line placement, lab and f/u on first day of treatment    Orders Placed This Encounter  Procedures   CBC with Differential ([place] Only)    Standing Status:   Standing    Number of Occurrences:   100    Standing Expiration Date:   02/04/2024   CMP ([place] only)    Standing Status:   Standing    Number of Occurrences:   100    Standing Expiration Date:   02/04/2024   Ferritin    Standing Status:   Standing    Number of Occurrences:   100    Standing Expiration Date:   02/04/2024   Iron and TIBC    Standing Status:   Standing    Number of Occurrences:   100    Standing Expiration Date:   02/04/2024   Type and screen    Standing Status:   Future    Number of Occurrences:   1    Standing Expiration Date:   02/04/2020    All questions were answered. The patient knows to call the clinic with any problems, questions or concerns. I spent 55 minutes counseling the patient face to face. The total time spent in the appointment was 60 minutes and more than 50% was on counseling.     Truitt Merle, MD 02/04/2019 11:15 PM  I, Joslyn Devon, am acting as scribe for Truitt Merle, MD.   I have reviewed the above documentation for accuracy and completeness, and I agree with the above.

## 2019-02-01 NOTE — Telephone Encounter (Signed)
Received a new patient referral from Dr. Marlou Starks for invasive squamous cell carcinoma of the rectal wall. Ms. Caratachea has been cld and scheduled to see Dr. Burr Medico on 9/17 at 3pm. She's been made aware to arrive 15 minutes early.

## 2019-02-03 ENCOUNTER — Ambulatory Visit (HOSPITAL_COMMUNITY)
Admission: RE | Admit: 2019-02-03 | Discharge: 2019-02-03 | Disposition: A | Discharge status: Home / Self Care | Payer: BC Managed Care – PPO | Source: Ambulatory Visit | Attending: Surgery | Admitting: Surgery

## 2019-02-03 ENCOUNTER — Other Ambulatory Visit: Payer: Self-pay

## 2019-02-03 ENCOUNTER — Encounter (HOSPITAL_COMMUNITY): Payer: Self-pay

## 2019-02-03 DIAGNOSIS — C20 Malignant neoplasm of rectum: Secondary | ICD-10-CM | POA: Diagnosis present

## 2019-02-03 MED ORDER — IOHEXOL 300 MG/ML  SOLN
75.0000 mL | Freq: Once | INTRAMUSCULAR | Status: AC | PRN
  Administered 2019-02-03: 75 mL via INTRAVENOUS

## 2019-02-03 MED ORDER — SODIUM CHLORIDE (PF) 0.9 % IJ SOLN
INTRAMUSCULAR | Status: AC
  Filled 2019-02-03: qty 50

## 2019-02-04 ENCOUNTER — Encounter: Payer: Self-pay | Admitting: Hematology

## 2019-02-04 ENCOUNTER — Ambulatory Visit
Admission: RE | Admit: 2019-02-04 | Discharge: 2019-02-04 | Disposition: A | Discharge status: Home / Self Care | Payer: BC Managed Care – PPO | Source: Ambulatory Visit | Attending: Radiation Oncology | Admitting: Radiation Oncology

## 2019-02-04 ENCOUNTER — Other Ambulatory Visit: Payer: Self-pay

## 2019-02-04 ENCOUNTER — Inpatient Hospital Stay: Payer: BC Managed Care – PPO | Attending: Hematology | Admitting: Hematology

## 2019-02-04 ENCOUNTER — Inpatient Hospital Stay: Payer: BC Managed Care – PPO

## 2019-02-04 ENCOUNTER — Telehealth: Payer: Self-pay

## 2019-02-04 VITALS — BP 115/73 | HR 70 | Temp 98.3°F | Resp 18 | Ht 65.0 in | Wt 146.0 lb

## 2019-02-04 VITALS — BP 106/68 | HR 82 | Temp 98.3°F | Resp 18 | Ht 65.0 in | Wt 145.4 lb

## 2019-02-04 DIAGNOSIS — I10 Essential (primary) hypertension: Secondary | ICD-10-CM | POA: Diagnosis not present

## 2019-02-04 DIAGNOSIS — K6289 Other specified diseases of anus and rectum: Secondary | ICD-10-CM

## 2019-02-04 DIAGNOSIS — D649 Anemia, unspecified: Secondary | ICD-10-CM

## 2019-02-04 DIAGNOSIS — C20 Malignant neoplasm of rectum: Secondary | ICD-10-CM

## 2019-02-04 DIAGNOSIS — K5190 Ulcerative colitis, unspecified, without complications: Secondary | ICD-10-CM

## 2019-02-04 LAB — CMP (CANCER CENTER ONLY)
ALT: 10 U/L (ref 0–44)
AST: 13 U/L — ABNORMAL LOW (ref 15–41)
Albumin: 4 g/dL (ref 3.5–5.0)
Alkaline Phosphatase: 70 U/L (ref 38–126)
Anion gap: 7 (ref 5–15)
BUN: 10 mg/dL (ref 6–20)
CO2: 29 mmol/L (ref 22–32)
Calcium: 11.4 mg/dL — ABNORMAL HIGH (ref 8.9–10.3)
Chloride: 104 mmol/L (ref 98–111)
Creatinine: 0.83 mg/dL (ref 0.44–1.00)
GFR, Est AFR Am: >60 mL/min (ref >60)
GFR, Estimated: >60 mL/min (ref >60)
Glucose, Bld: 104 mg/dL — ABNORMAL HIGH (ref 70–99)
Potassium: 4.4 mmol/L (ref 3.5–5.1)
Sodium: 140 mmol/L (ref 135–145)
Total Bilirubin: 0.2 mg/dL — ABNORMAL LOW (ref 0.3–1.2)
Total Protein: 7.6 g/dL (ref 6.5–8.1)

## 2019-02-04 LAB — CBC WITH DIFFERENTIAL (CANCER CENTER ONLY)
Abs Immature Granulocytes: 0.01 10*3/uL (ref 0.00–0.07)
Basophils Absolute: 0 10*3/uL (ref 0.0–0.1)
Basophils Relative: 1 %
Eosinophils Absolute: 0.1 10*3/uL (ref 0.0–0.5)
Eosinophils Relative: 1 %
HCT: 30.9 % — ABNORMAL LOW (ref 36.0–46.0)
Hemoglobin: 9.9 g/dL — ABNORMAL LOW (ref 12.0–15.0)
Immature Granulocytes: 0 %
Lymphocytes Relative: 33 %
Lymphs Abs: 1.6 10*3/uL (ref 0.7–4.0)
MCH: 28 pg (ref 26.0–34.0)
MCHC: 32 g/dL (ref 30.0–36.0)
MCV: 87.5 fL (ref 80.0–100.0)
Monocytes Absolute: 0.4 10*3/uL (ref 0.1–1.0)
Monocytes Relative: 8 %
Neutro Abs: 2.8 10*3/uL (ref 1.7–7.7)
Neutrophils Relative %: 57 %
Platelet Count: 378 10*3/uL (ref 150–400)
RBC: 3.53 MIL/uL — ABNORMAL LOW (ref 3.87–5.11)
RDW: 13.6 % (ref 11.5–15.5)
WBC Count: 4.9 10*3/uL (ref 4.0–10.5)
nRBC: 0 % (ref 0.0–0.2)

## 2019-02-04 LAB — TYPE AND SCREEN
ABO/RH(D): O POS
Antibody Screen: NEGATIVE

## 2019-02-04 MED ORDER — TRAMADOL HCL 50 MG PO TABS: 50.0000 mg | ORAL_TABLET | Freq: Four times a day (QID) | ORAL | 0 refills | Status: DC | PRN

## 2019-02-04 MED ORDER — ONDANSETRON HCL 8 MG PO TABS: 8.0000 mg | ORAL_TABLET | Freq: Two times a day (BID) | ORAL | 1 refills | Status: DC | PRN

## 2019-02-04 MED ORDER — PROCHLORPERAZINE MALEATE 10 MG PO TABS: 10.0000 mg | ORAL_TABLET | Freq: Four times a day (QID) | ORAL | 1 refills | Status: DC | PRN

## 2019-02-04 NOTE — Progress Notes (Signed)
GI Location of Tumor / Histology: Rectal mass  Leah Bailey presented with about 3 months of intermittent bloody stools and left sided rectal pain.  Her symptoms worsened and was associated with intermittent episodes of lower back pain and left lower abdominal cramping..  CT Chest 02/03/2019:  Two nodules each measuring about 4 mm in long axis noted in the right upper lobe. One of these is subpleural. These are likely benign but given the context, surveillance imaging in 6-12 months time should be considered. Fleischner criteria for follow do not directly apply given the history of malignancy.  CT Pelvis 01/14/2019: Perirectal abscess, at the left posterior aspect of the rectum, inseparable from the wall of the colon and the adjacent pelvic musculature. Largest diameter on cranial caudal imaging measures 4.8 cm. Asymmetric thickening of the rectum associated with the abscess. A perforated rectal carcinoma is favored given the appearance, and correlation with physical exam as well anoscopy/rigid sigmoidoscopy. Alternatively, the changes may reflect chronic inflammation in the setting of inflammatory bowel disease. There are several small lymph nodes within the perirectal fat and along the left pelvis, the largest measuring 13 mm, potentially pathologic in the setting of carcinoma, or alternatively reactive.   Biopsies of Rectal wall abscess 01/15/2019   Past/Anticipated interventions by surgeon, if any:  Dr. Marlou Starks 01/15/2019 -Internal drainage of perirectal abscess   Past/Anticipated interventions by medical oncology, if any:  Dr. Burr Medico 02/04/2019    Weight changes, if any:  Bowel/Bladder complaints, if any: taking metamucil and stool softeners.  1/2 hour after eating she has rectal pressure and pain.  Nausea / Vomiting, if any:   Pain issues, if any: Rectal pain and pressure.  Any blood per rectum: intermittent bloody stools.  SAFETY ISSUES:  Prior radiation?    Pacemaker/ICD?   Possible current pregnancy? Hysterectomy  Is the patient on methotrexate?   Current Complaints/Details:

## 2019-02-04 NOTE — Telephone Encounter (Signed)
Called and spoke with Leah Bailey to introduce role of Nurse Navigator.. Reviewed visitor restrictions. She would like to have her son, Leah Bailey, included in today's initial consult by phone. Reviewed tx team members and will provided written materials on rectal cancer, [place] resources and contact information for team members. She has my direct number for questions or concerns. Will follow as needed.

## 2019-02-04 NOTE — Progress Notes (Signed)
START ON PATHWAY REGIMEN - Anal Carcinoma     Chemotherapy concurrent with RT:     Mitomycin      Fluorouracil   **Always confirm dose/schedule in your pharmacy ordering system**  Patient Characteristics: Anal and Anal Margin Tumors, Newly Diagnosed - Locoregional Disease Not Amenable to Local Excision AJCC T Category: T2 AJCC N Category: N1a AJCC M Category: M0 AJCC 8 Stage Grouping: IIIA Current Disease Status: Newly Diagnosed - Locoregional Disease Not Amendable to Local Excision Intent of Therapy: Curative Intent, Discussed with Patient

## 2019-02-04 NOTE — Progress Notes (Signed)
Radiation Oncology         ([phone]) [phone] ________________________________  Name: Leah Bailey        MRN: [id number]  Date of Service: 02/04/2019 DOB: June 18, 1955  Leah Bailey:Leah Bailey, Leah Rock, PA  Truitt Merle, MD     REFERRING PHYSICIAN: Truitt Merle, MD   DIAGNOSIS: Leah encounter diagnosis was Squamous cell carcinoma of rectum ([place]).   HISTORY OF PRESENT ILLNESS: Leah Bailey is a 63 y.o. female seen at Leah request of Dr. Dema Severin and Dr. Burr Medico for a newly diagnosed cancer arising from Leah distal rectum. Leah patient has a history of ulcerative colitis and possibly Crohn's diease that has been followed by GI for some time. She reports she has never had bowel resection. She had a colonoscopy with Dr. Carlean Purl on 01/06/18 revealing changes consistent with colitis, but no mass in Leah anorectal region. She has been having severe rectal pain and increased difficulty with passing stool, despite metamucil. She reports she has frequency and urgency to go to Leah bathroom about 30 minutes after eating. She reports her pain as burning and heaviness that does not respond well to tramadol, and bothers her mostly at night or when she's been working. She proceeded with hospital evaluation due to progressive symptoms, and was diagnosed by CT on 01/14/2019 with a perirectal abscess measuring about 4.8 cm with assymetric thickening of Leah rectum and several perirectal nodes in Leah left pelvis Leah largest measuring 13 mm. On 01/15/2019 she underwent surgical drainage of Leah site and final pathology revealed a moderate to poorly differentiated squamous cell carcinoma. She had outpatient CT of Leah chest on 02/03/2019 that revealed two 4 mm nodules in Leah RUL. She is seen today as a work in for her newly diagnosed cancer.    PREVIOUS RADIATION THERAPY: No   PAST MEDICAL HISTORY:  Past Medical History:  Diagnosis Date   Herpes simplex disciform keratitis 10/20/2013   Hypertension    IBD (inflammatory bowel disease) -  colitis 01/11/2018   Nuclear sclerosis of both eyes 11/30/2014   Squamous cell carcinoma of rectum ([place]) 01/13/2019   UC (ulcerative colitis) ([place])        PAST SURGICAL HISTORY: Past Surgical History:  Procedure Laterality Date   ABDOMINAL HYSTERECTOMY     BREAST CYST ASPIRATION Left 1983   COLONOSCOPY     INCISION AND DRAINAGE PERIRECTAL ABSCESS N/A 01/15/2019   Procedure: INTERNAL DRAINAGE OF PERIRECTAL ABSCESS;  Surgeon: Jovita Kussmaul, MD;  Location: [facility];  Service: General;  Laterality: N/A;   TUBAL LIGATION       FAMILY HISTORY:  Family History  Problem Relation Age of Onset   Diabetes Mother    Breast cancer Mother 14   Heart attack Father    Heart disease Father    Diabetes Sister    Diabetes Brother    Diabetes Sister    Healthy Son    Healthy Son    Diabetes Brother    Breast cancer Cousin    Colon cancer Neg Hx    Esophageal cancer Neg Hx    Stomach cancer Neg Hx    Rectal cancer Neg Hx      SOCIAL HISTORY:  reports that she is a non-smoker but has been exposed to tobacco smoke. She has never used smokeless tobacco. She reports that she does not drink alcohol or use drugs. Leah patient is married and resides in [place]. She works as a Quarry manager at [employer].   ALLERGIES: Patient has no known allergies.  MEDICATIONS:  Current Outpatient Medications  Medication Sig Dispense Refill   aspirin 81 MG tablet Take 81 mg by mouth daily.     ferrous sulfate 325 (65 FE) MG EC tablet Take 650 mg by mouth daily with breakfast.      lisinopril-hydrochlorothiazide (PRINZIDE,ZESTORETIC) 20-25 MG tablet Take 1 tablet by mouth daily. 90 tablet 3   mesalamine (LIALDA) 1.2 g EC tablet TAKE 2 TABLETS (2.4 G TOTAL) BY MOUTH DAILY WITH BREAKFAST. 180 tablet 3   Multiple Vitamins-Calcium (ONE-A-DAY WOMENS PO) Take 1 tablet by mouth daily.     naproxen sodium (ALEVE) 220 MG tablet Take 220 mg by mouth daily as needed (pain).       No current facility-administered medications for this encounter.      REVIEW OF SYSTEMS: On review of systems, Leah patient reports that she is doing okay. See HPI for GI symptoms. She denies any chest pain, shortness of breath, cough, fevers, chills, night sweats, unintended weight changes. She denies any bladder disturbances, and denies abdominal pain, nausea or vomiting. She denies any new musculoskeletal or joint aches or pains. A complete review of systems is obtained and is otherwise negative.     PHYSICAL EXAM:  Wt Readings from Last 3 Encounters:  02/04/19 145 lb 6.4 oz (66 kg)  01/15/19 147 lb 11.3 oz (67 kg)  01/13/19 148 lb (67.1 kg)   Temp Readings from Last 3 Encounters:  02/04/19 98.3 F (36.8 C) (Oral)  01/17/19 98.7 F (37.1 C) (Oral)  01/13/19 98.6 F (37 C)   BP Readings from Last 3 Encounters:  02/04/19 106/68  01/17/19 129/81  01/13/19 120/60   Pulse Readings from Last 3 Encounters:  02/04/19 82  01/17/19 78  01/13/19 81   Pain Assessment Pain Score: 6  Pain Loc: Rectum(internally)/10  In general this is a well appearing African American female in no acute distress. She's alert and oriented x4 and appropriate throughout Leah examination. Cardiopulmonary assessment is negative for acute distress and she exhibits normal effort.     ECOG = 1  0 - Asymptomatic (Fully active, able to carry on all predisease activities without restriction)  1 - Symptomatic but completely ambulatory (Restricted in physically strenuous activity but ambulatory and able to carry out work of a light or sedentary nature. For example, light housework, office work)  2 - Symptomatic, <50% in bed during Leah day (Ambulatory and capable of all self care but unable to carry out any work activities. Up and about more than 50% of waking hours)  3 - Symptomatic, >50% in bed, but not bedbound (Capable of only limited self-care, confined to bed or chair 50% or more of waking hours)  4  - Bedbound (Completely disabled. Cannot carry on any self-care. Totally confined to bed or chair)  5 - Death   Eustace Pen MM, Creech RH, Tormey DC, et al. [phone]). "Toxicity and response criteria of [facility]". [place] Oncol. 5 (6): 649-55    LABORATORY DATA:  Lab Results  Component Value Date   WBC 8.7 01/17/2019   HGB 7.6 (L) 01/17/2019   HCT 24.3 (L) 01/17/2019   MCV 89.0 01/17/2019   PLT 307 01/17/2019   Lab Results  Component Value Date   NA 136 01/16/2019   K 4.2 01/16/2019   CL 101 01/16/2019   CO2 25 01/16/2019   Lab Results  Component Value Date   ALT 16 01/15/2019   AST 14 (L) 01/15/2019   ALKPHOS  71 01/15/2019   BILITOT 0.7 01/15/2019      RADIOGRAPHY: Ct Chest W Contrast  Result Date: 02/04/2019 CLINICAL DATA:  Squamous cell carcinoma involving Leah rectal wall. History of inflammatory bowel disease. EXAM: CT CHEST WITH CONTRAST TECHNIQUE: Multidetector CT imaging of Leah chest was performed during intravenous contrast administration. CONTRAST:  63m OMNIPAQUE IOHEXOL 300 MG/ML  SOLN COMPARISON:  Overlapping portions of CT abdomen from 11/17/2018 FINDINGS: Cardiovascular: Unremarkable Mediastinum/Nodes: Unremarkable Lungs/Pleura: 4 mm sub solid nodule of Leah right upper lobe on image 33/7. 0.4 by 0.3 cm subpleural nodule in Leah right upper lobe anteriorly on image 77/7. Upper Abdomen: Unremarkable where included. Musculoskeletal: Unremarkable IMPRESSION: 1. Two nodules each measuring about 4 mm in long axis noted in Leah right upper lobe. One of these is subpleural. These are likely benign but given Leah context, surveillance imaging in 6-12 months time should be considered. Fleischner criteria for follow do not directly apply given Leah history of malignancy. Electronically Signed   By: WVan ClinesM.D.   On: 02/04/2019 08:38   Ct Pelvis W Contrast  Result Date: 01/14/2019 CLINICAL DATA:  63year old female with a history of possible  abscess. History of Crohn's disease EXAM: CT PELVIS WITH CONTRAST TECHNIQUE: Multidetector CT imaging of Leah pelvis was performed using Leah standard protocol following Leah bolus administration of intravenous contrast. CONTRAST:  1043mOMNIPAQUE IOHEXOL 300 MG/ML  SOLN COMPARISON:  November 17, 2018 FINDINGS: Urinary Tract:  Urinary bladder unremarkable. Bowel:  Visualized small bowel unremarkable.  Normal appendix. Leah cecum is unremarkable. Rim enhancing fluid collection adjacent to Leah rectum, inseparable from Leah wall of Leah rectum measures 3 cm on Leah axial images, approximately 4.8 cm in cranial caudal span on Leah coronal images. There is asymmetric wall thickening of Leah rectum on image 44 series 2, at Leah level of Leah abscess. Associated inflammatory changes in Leah perirectal fat. Small lymph nodes are present along Leah left perirectal fat. There is a lymph node adjacent to Leah rectum on image 33 of series 2 which measures 13 mm. This was present on Leah comparison CT of June 2020, and appears to have enlarged slightly in Leah interval. Vascular/Lymphatic: No significant atherosclerotic changes. Small lymph nodes within Leah fat in Leah perirectal region. Reproductive:  Hysterectomy Other:  None Musculoskeletal: No acute displaced fracture. IMPRESSION: Perirectal abscess, at Leah left posterior aspect of Leah rectum, inseparable from Leah wall of Leah colon and Leah adjacent pelvic musculature. Largest diameter on cranial caudal imaging measures 4.8 cm. Asymmetric thickening of Leah rectum associated with Leah abscess. A perforated rectal carcinoma is favored given Leah appearance, and correlation with physical exam as well anoscopy/rigid sigmoidoscopy. Alternatively, Leah changes may reflect chronic inflammation in Leah setting of inflammatory bowel disease. There are several small lymph nodes within Leah perirectal fat and along Leah left pelvis, Leah largest measuring 13 mm, potentially pathologic in Leah setting of carcinoma,  or alternatively reactive. Electronically Signed   By: JaCorrie Mckusick.O.   On: 01/14/2019 16:05       IMPRESSION/PLAN: 1. Stage IIIA, [id number] moderate to poorly differentiated squamous cell carcinoma of Leah rectum. Dr. MoLisbeth Renshawiscusses Leah pathology findings and reviews Leah nature of squamous cell abnormalities. Typically these cancers are of Leah anus, but in this situation of Leah distal rectum. She would benefit from chemoRT to treat this disease and we discussed Leah utility of Leah treatment. We discussed Leah risks, benefits, short, and long term effects of radiotherapy, and Leah patient is interested  in proceeding. She meets with Dr. Burr Medico as well today. We discussed Leah plan to first obtain a PET scan for formal staging. Dr. Lisbeth Renshaw discusses Leah delivery and logistics of radiotherapy and anticipates a course of 6 weeks of radiotherapy to Leah rectum and regional nodes. We will coordinate simulation once she's had her PET scheduled.    This encounter was provided by telemedicine platform [employer].  Leah patient has given verbal consent for this type of encounter and has been advised to only accept a meeting of this type in a secure network environment. Leah time spent during this encounter was 45 minutes. Leah attendants for this meeting include Blenda Nicely, RN, Dr. Lisbeth Renshaw, Hayden Pedro  and [place].  During Leah encounter,  Blenda Nicely, RN, Dr. Lisbeth Renshaw, and Hayden Pedro were located at [facility] Radiation Oncology Department.  Zarielle Altamirano was in our clinic remotely via [employer].  Leah above documentation reflects my direct findings during this shared patient visit. Please see Leah separate note by Dr. Lisbeth Renshaw on this date for Leah remainder of Leah patient's plan of care.    Carola Rhine, PAC

## 2019-02-05 ENCOUNTER — Telehealth: Payer: Self-pay | Admitting: Hematology

## 2019-02-05 ENCOUNTER — Telehealth: Payer: Self-pay | Admitting: *Deleted

## 2019-02-05 LAB — IRON AND TIBC
Iron: 46 ug/dL (ref 41–142)
Saturation Ratios: 19 % — ABNORMAL LOW (ref 21–57)
TIBC: 248 ug/dL (ref 236–444)
UIBC: 201 ug/dL (ref 120–384)

## 2019-02-05 LAB — ABO/RH: ABO/RH(D): O POS

## 2019-02-05 LAB — FERRITIN: Ferritin: 528 ng/mL — ABNORMAL HIGH (ref 11–307)

## 2019-02-05 NOTE — Telephone Encounter (Signed)
CALLED PATIENT TO INFORM OF PET SCAN FOR 02-15-19 - ARRIVAL TIME- 8:30 AM @ [facility], PT. TO HAVE WATER ONLY - 6 HRS. PRIOR TO TEST, AND PATIENT TO HAVE NO CARBS. 12 HRS. PRIOR TO TEST, SPOKE WITH PATIENT AND SHE IS AWARE OF THIS TEST

## 2019-02-05 NOTE — Telephone Encounter (Signed)
Scheduled appt per 9/17 los.  Spoke with patient and she is aware of her patient education class.

## 2019-02-10 ENCOUNTER — Other Ambulatory Visit: Payer: Self-pay

## 2019-02-10 ENCOUNTER — Encounter: Payer: Self-pay | Admitting: Hematology

## 2019-02-10 ENCOUNTER — Inpatient Hospital Stay: Payer: BC Managed Care – PPO

## 2019-02-10 NOTE — Progress Notes (Signed)
Met with patient to introduce myself as Arboriculturist and to offer available resources.  Discussed one-time $79 Engineer, drilling to assist with personal expenses while going through treatment.  Gave her my card if interested in applying and for any additional financial questions or concerns. She verbalized understanding.

## 2019-02-15 ENCOUNTER — Encounter: Payer: Self-pay | Admitting: Nurse Practitioner

## 2019-02-15 ENCOUNTER — Telehealth: Payer: Self-pay | Admitting: Radiation Oncology

## 2019-02-15 ENCOUNTER — Other Ambulatory Visit: Payer: Self-pay

## 2019-02-15 ENCOUNTER — Inpatient Hospital Stay: Payer: BC Managed Care – PPO | Admitting: Nurse Practitioner

## 2019-02-15 ENCOUNTER — Inpatient Hospital Stay: Payer: BC Managed Care – PPO

## 2019-02-15 ENCOUNTER — Ambulatory Visit (HOSPITAL_COMMUNITY)
Admission: RE | Admit: 2019-02-15 | Discharge: 2019-02-15 | Disposition: A | Discharge status: Home / Self Care | Payer: BC Managed Care – PPO | Source: Ambulatory Visit | Attending: Radiation Oncology | Admitting: Radiation Oncology

## 2019-02-15 VITALS — BP 111/80 | HR 73 | Temp 98.9°F | Resp 18 | Ht 65.0 in | Wt 142.9 lb

## 2019-02-15 DIAGNOSIS — C20 Malignant neoplasm of rectum: Secondary | ICD-10-CM | POA: Diagnosis not present

## 2019-02-15 LAB — CMP (CANCER CENTER ONLY)
ALT: 14 U/L (ref 0–44)
AST: 11 U/L — ABNORMAL LOW (ref 15–41)
Albumin: 3.9 g/dL (ref 3.5–5.0)
Alkaline Phosphatase: 68 U/L (ref 38–126)
Anion gap: 7 (ref 5–15)
BUN: 6 mg/dL (ref 6–20)
CO2: 29 mmol/L (ref 22–32)
Calcium: 11.8 mg/dL — ABNORMAL HIGH (ref 8.9–10.3)
Chloride: 105 mmol/L (ref 98–111)
Creatinine: 0.82 mg/dL (ref 0.44–1.00)
GFR, Est AFR Am: >60 mL/min (ref >60)
GFR, Estimated: >60 mL/min (ref >60)
Glucose, Bld: 156 mg/dL — ABNORMAL HIGH (ref 70–99)
Potassium: 3.9 mmol/L (ref 3.5–5.1)
Sodium: 141 mmol/L (ref 135–145)
Total Bilirubin: 0.3 mg/dL (ref 0.3–1.2)
Total Protein: 7.5 g/dL (ref 6.5–8.1)

## 2019-02-15 LAB — CBC WITH DIFFERENTIAL (CANCER CENTER ONLY)
Abs Immature Granulocytes: 0.01 10*3/uL (ref 0.00–0.07)
Basophils Absolute: 0 10*3/uL (ref 0.0–0.1)
Basophils Relative: 0 %
Eosinophils Absolute: 0 10*3/uL (ref 0.0–0.5)
Eosinophils Relative: 1 %
HCT: 32.1 % — ABNORMAL LOW (ref 36.0–46.0)
Hemoglobin: 10.5 g/dL — ABNORMAL LOW (ref 12.0–15.0)
Immature Granulocytes: 0 %
Lymphocytes Relative: 35 %
Lymphs Abs: 1.8 10*3/uL (ref 0.7–4.0)
MCH: 28.5 pg (ref 26.0–34.0)
MCHC: 32.7 g/dL (ref 30.0–36.0)
MCV: 87.2 fL (ref 80.0–100.0)
Monocytes Absolute: 0.4 10*3/uL (ref 0.1–1.0)
Monocytes Relative: 7 %
Neutro Abs: 2.9 10*3/uL (ref 1.7–7.7)
Neutrophils Relative %: 57 %
Platelet Count: 330 10*3/uL (ref 150–400)
RBC: 3.68 MIL/uL — ABNORMAL LOW (ref 3.87–5.11)
RDW: 13.4 % (ref 11.5–15.5)
WBC Count: 5.2 10*3/uL (ref 4.0–10.5)
nRBC: 0 % (ref 0.0–0.2)

## 2019-02-15 LAB — GLUCOSE, CAPILLARY: Glucose-Capillary: 122 mg/dL — ABNORMAL HIGH (ref 70–99)

## 2019-02-15 MED ORDER — TRAMADOL HCL 50 MG PO TABS: 50.0000 mg | ORAL_TABLET | Freq: Four times a day (QID) | ORAL | 0 refills | Status: DC | PRN

## 2019-02-15 MED ORDER — FLUDEOXYGLUCOSE F - 18 (FDG) INJECTION
7.3100 | Freq: Once | INTRAVENOUS | Status: AC | PRN
  Administered 2019-02-15: 7.31 via INTRAVENOUS

## 2019-02-15 NOTE — Telephone Encounter (Signed)
LM with pt for PET results and that we'd see her tomorrow for simulation. She will start treatment on 02/22/2019.

## 2019-02-15 NOTE — Progress Notes (Addendum)
[place]   [phone] [phone]   Clinic Follow up Note   Patient Care Team: Harrison Mons, PA as PCP - General (Family Medicine) Jovita Kussmaul, MD as Consulting Physician (General Surgery) Ileana Roup, MD as Consulting Physician (General Surgery) Kyung Rudd, MD as Consulting Physician (Radiation Oncology) Truitt Merle, MD as Consulting Physician (Hematology) Arna Snipe, RN as Oncology Nurse Navigator Date of service: 02/15/19   CHIEF COMPLAINT: f/u rectal cancer    SUMMARY OF ONCOLOGIC HISTORY: Oncology History Overview Note  Cancer Staging Squamous cell carcinoma of rectum [facility]) Staging form: Colon and Rectum, AJCC 8th Edition - Clinical stage from 02/04/2019: Stage IIIA (cT2, cN1a, cM0) - Signed by Truitt Merle, MD on 02/04/2019     Squamous cell carcinoma of rectum ([place])  01/13/2019 Initial Diagnosis   Squamous cell carcinoma of rectum ([place])   01/14/2019 Imaging   CT Pelvis W Contrast 01/14/19 IMPRESSION: Perirectal abscess, at the left posterior aspect of the rectum, inseparable from the wall of the colon and the adjacent pelvic musculature. Largest diameter on cranial caudal imaging measures 4.8 cm.   Asymmetric thickening of the rectum associated with the abscess. A perforated rectal carcinoma is favored given the appearance, and correlation with physical exam as well anoscopy/rigid sigmoidoscopy. Alternatively, the changes may reflect chronic inflammation in the setting of inflammatory bowel disease.   There are several small lymph nodes within the perirectal fat and along the left pelvis, the largest measuring 13 mm, potentially pathologic in the setting of carcinoma, or alternatively reactive.   01/15/2019 Initial Diagnosis   Diagnosis 01/15/19 Soft tissue, abscess, rectal wall - INVASIVE SQUAMOUS CELL CARCINOMA, MODERATELY TO POORLY DIFFERENTIATED.   02/03/2019 Imaging   CT Chest W Contrast 02/03/19  IMPRESSION:  1. Two nodules each measuring about 4 mm in long axis noted in the right upper lobe. One of these is subpleural. These are likely benign but given the context, surveillance imaging in 6-12 months time should be considered. Fleischner criteria for follow do not directly apply given the history of malignancy.   02/04/2019 Cancer Staging   Staging form: Colon and Rectum, AJCC 8th Edition - Clinical stage from 02/04/2019: Stage IIIA (cT2, cN1a, cM0) - Signed by Truitt Merle, MD on 02/04/2019   02/15/2019 -  Chemotherapy   The patient had mitoMYcin (MUTAMYCIN) chemo injection 17.5 mg, 10 mg/m2 = 17.5 mg, Intravenous,  Once, 0 of 1 cycle fluorouracil (ADRUCIL) 6,950 mg in sodium chloride 0.9 % 111 mL chemo infusion, 1,000 mg/m2/day = 6,950 mg, Intravenous, 4D (96 hours ), 0 of 1 cycle  for chemotherapy treatment.    02/15/2019 PET scan   IMPRESSION: 1. Hypermetabolic eccentric rectal wall thickening with hypermetabolic left perirectal adenopathy. No evidence of distant metastatic disease. 2. Possible gallbladder sludge. 3. Left renal stone.     CURRENT THERAPY: PENDING chemoRT with Mitomycin/5FU to start 10/5  INTERVAL HISTORY: Ms. Sadiq returns for f/u as scheduled. She reports being extremely fatigued, she rests often, walks occasionally. She has not been working lately. Rectal pain is 4/10 currently, gets up to 10/10. Tramadol helps, has been taking 2 tabs q5-6 hours. Has 3 BMs per week which are painful. Has occasional blood mixed with stool but less amount lately. For constipation, she tried stool softener which didn't help much. If no BM that day, she can't eat because rectal pain increases with meals. Down to 1 meal per day. Dulcolax worked in the past but she didn't know if it was  safe to continue. Has occasional dry cough she attributes to lisinopril. Denies chest pain or dyspnea. No recent fever or chills.    MEDICAL HISTORY:  Past Medical History:  Diagnosis Date  . Herpes simplex  disciform keratitis 10/20/2013  . Hypertension   . IBD (inflammatory bowel disease) - colitis 01/11/2018  . Nuclear sclerosis of both eyes 11/30/2014  . Squamous cell carcinoma of rectum ([place]) 01/13/2019  . UC (ulcerative colitis) ([place])     SURGICAL HISTORY: Past Surgical History:  Procedure Laterality Date  . ABDOMINAL HYSTERECTOMY    . BREAST CYST ASPIRATION Left 1983  . COLONOSCOPY    . INCISION AND DRAINAGE PERIRECTAL ABSCESS N/A 01/15/2019   Procedure: INTERNAL DRAINAGE OF PERIRECTAL ABSCESS;  Surgeon: Jovita Kussmaul, MD;  Location: [facility];  Service: General;  Laterality: N/A;  . TUBAL LIGATION      I have reviewed the social history and family history with the patient and they are unchanged from previous note.  ALLERGIES:  has No Known Allergies.  MEDICATIONS:  Current Outpatient Medications  Medication Sig Dispense Refill  . aspirin 81 MG tablet Take 81 mg by mouth daily.    . ferrous sulfate 325 (65 FE) MG EC tablet Take 650 mg by mouth daily with breakfast.     . lisinopril-hydrochlorothiazide (PRINZIDE,ZESTORETIC) 20-25 MG tablet Take 1 tablet by mouth daily. 90 tablet 3  . mesalamine (LIALDA) 1.2 g EC tablet TAKE 2 TABLETS (2.4 G TOTAL) BY MOUTH DAILY WITH BREAKFAST. 180 tablet 3  . Multiple Vitamins-Calcium (ONE-A-DAY WOMENS PO) Take 1 tablet by mouth daily.    . ondansetron (ZOFRAN) 8 MG tablet Take 1 tablet (8 mg total) by mouth 2 (two) times daily as needed (Nausea or vomiting). 30 tablet 1  . prochlorperazine (COMPAZINE) 10 MG tablet Take 1 tablet (10 mg total) by mouth every 6 (six) hours as needed (Nausea or vomiting). 30 tablet 1  . traMADol (ULTRAM) 50 MG tablet Take 1-2 tablets (50-100 mg total) by mouth every 6 (six) hours as needed. 60 tablet 0  . naproxen sodium (ALEVE) 220 MG tablet Take 220 mg by mouth daily as needed (pain).      No current facility-administered medications for this visit.     PHYSICAL EXAMINATION: ECOG PERFORMANCE STATUS: 2 - Symptomatic,  <50% confined to bed  Vitals:   02/15/19 1441  BP: 111/80  Pulse: 73  Resp: 18  Temp: 98.9 F (37.2 C)  SpO2: 100%   Filed Weights   02/15/19 1441  Weight: 142 lb 14.4 oz (64.8 kg)    GENERAL:alert, no distress and comfortable SKIN: no rash  EYES: sclera clear LUNGS: clear with normal breathing effort HEART: regular rate & rhythm, no lower extremity edema ABDOMEN:abdomen soft, non-tender and normal bowel sounds Musculoskeletal:no cyanosis of digits NEURO: alert & oriented x 3 with fluent speech, normal gait Rectal exam: deferred   LABORATORY DATA:  I have reviewed the data as listed CBC Latest Ref Rng & Units 02/15/2019 02/04/2019 01/17/2019  WBC 4.0 - 10.5 K/uL 5.2 4.9 8.7  Hemoglobin 12.0 - 15.0 g/dL 10.5(L) 9.9(L) 7.6(L)  Hematocrit 36.0 - 46.0 % 32.1(L) 30.9(L) 24.3(L)  Platelets 150 - 400 K/uL 330 378 307     CMP Latest Ref Rng & Units 02/15/2019 02/04/2019 01/16/2019  Glucose 70 - 99 mg/dL 156(H) 104(H) 133(H)  BUN 6 - 20 mg/dL 6 10 19   Creatinine 0.44 - 1.00 mg/dL 0.82 0.83 0.86  Sodium 135 - 145 mmol/L 141 140 136  Potassium 3.5 - 5.1 mmol/L 3.9 4.4 4.2  Chloride 98 - 111 mmol/L 105 104 101  CO2 22 - 32 mmol/L 29 29 25   Calcium 8.9 - 10.3 mg/dL 11.8(H) 11.4(H) 9.2  Total Protein 6.5 - 8.1 g/dL 7.5 7.6 -  Total Bilirubin 0.3 - 1.2 mg/dL 0.3 0.2(L) -  Alkaline Phos 38 - 126 U/L 68 70 -  AST 15 - 41 U/L 11(L) 13(L) -  ALT 0 - 44 U/L 14 10 -      RADIOGRAPHIC STUDIES: I have personally reviewed the radiological images as listed and agreed with the findings in the report. Nm Pet Image Initial (pi) Skull Base To Thigh  Result Date: 02/15/2019 CLINICAL DATA:  Initial treatment strategy for anorectal cancer. EXAM: NUCLEAR MEDICINE PET SKULL BASE TO THIGH TECHNIQUE: 7.3 mCi F-18 FDG was injected intravenously. Full-ring PET imaging was performed from the skull base to thigh after the radiotracer. CT data was obtained and used for attenuation correction and anatomic  localization. Fasting blood glucose: 122 mg/dl COMPARISON:  CT chest 02/03/2019, CT pelvis 01/14/2019 and CT abdomen and pelvis 11/17/2018. FINDINGS: Mediastinal blood pool activity: SUV max 2.8 Liver activity: SUV max NA NECK: No abnormal hypermetabolism. Symmetric vocal cord hypermetabolism is presumably physiologic. Incidental CT findings: None. CHEST: No hypermetabolic mediastinal, hilar or axillary lymph nodes. No hypermetabolic pulmonary nodules. Incidental CT findings: None. ABDOMEN/PELVIS: No abnormal hypermetabolism in the liver, adrenal glands, spleen or pancreas. Intense hypermetabolism is associated with eccentric posterolateral rectal wall thickening which measures up to 2.2 cm in thickness and has an SUV max of 23.2. Adjacent left perirectal lymph nodes measure up to 1.2 cm (4/161) with an SUV max of 14.9. No additional hypermetabolic lymph nodes. Incidental CT findings: Liver is unremarkable. There may be sludge in the gallbladder. Adrenal glands and right kidney are unremarkable. Punctate stone in the lower pole left kidney. Spleen, pancreas, stomach and bowel are grossly unremarkable. Eccentric rectal wall thickening and perirectal adenopathy are discussed above. SKELETON: No abnormal osseous hypermetabolism. Incidental CT findings: Degenerative changes in the spine. IMPRESSION: 1. Hypermetabolic eccentric rectal wall thickening with hypermetabolic left perirectal adenopathy. No evidence of distant metastatic disease. 2. Possible gallbladder sludge. 3. Left renal stone. Electronically Signed   By: Lorin Picket M.D.   On: 02/15/2019 10:12     ASSESSMENT & PLAN: Krissia Schreier is a 63 y.o. African American female with a history of HTN, IBD, ulcerative colitis   1. Squamous cell carcinoma of rectum   -diagnosed 12/2018. Her CT scans shows low rectal wall thickening with abcess and surrounding lymphadenopathy, largest up to 23m. Chest scan also shows 2 tiny 483mright lung nodules which are  likely benign.  -Dr. FeBurr Medicoreviously discussed treatment of rectal squamous cell carcinoma is similar to anal cancer.  for locally advanced disease, standard therapy is definitive concurrent chemoRT with Mitomycin and 5FU M-F on week 1 and 5.  Discussed the rate of cure is high, and the surgery is reserved for residual or recurrent disease after chemoradiation. -She underwent staging PET scan which showed hypermetabolic rectal wall thickening with hypermetabolic left perirectal adenopathy, consistent with locally advanced disease. No distant metastasis. I reviewed this with the patient today.  -We again reviewed standard chemotherapy regimen with 5FU and mitomycin with concurrent RT. We reviewed potential side effects, symptom management, diet, and ways to enhance performance status at home. She agrees to proceed with chemo. She attended chemo class and feels well prepared.  -treatment goal is curative.  -  Ms. Colantonio appears stable. She has significant fatigue and rectal pain. Takes tramadol to 2 tabs q5-6 hours. She can try 650 mg tylenol plus heat for breakthrough pain. Heating pad helped her in the past. I refilled tramadol. Will titrate her pain meds as needed on therapy -she will restart laxative such as dulcolax this week. If she develops diarrhea on treatment may need to hold this.  -for her fatigue, I encouraged her to increase activity at home as tolerated. She was previously very active.  -labs reviewed, CBC and CMP stable. Ca 11.8 which may contribute to her fatigue. I encouraged her to increase hydration.  -she is not eating much, rectal pain increases during meals. I encouraged her to start nutrition supplement. I referred her to dietician.  -she will start treatment next week, will get PICC line per IR on 10/5 then return for lab and f/u before starting treatment -lab and f/u weekly on treatment   2. Anemia, GI Bleeding  -Per patient she has had chronic mildly low anemia. She has been  on oral ferrous sulfate.  -This has worsened lately secondary to her cancer and GI bleeding -ferritin is elevated, but serum iron 46 in low-normal range. She does not need IV Feraheme for now.  -if she can tolerate without worsening of constipation, she can take oral iron pill  3. Rectal Pain  -Secondary to #1  -Pain not constant but can get up to 10/10. Is exacerbated by eating and BMs.  -On tramadol currently but still not controlling her pain on 2 tabs q5-6 hours PRN -I discussed as she starts intensive treatment her rectal pain will worsen. Will titrate pain meds as needed  -she can try tylenol 650 mg plus heat for breakthrough, heat helped her in the past  4. Ulcerative Colitis/IBD, Diverticulosis   -Found incidentally on 12/2017 screening colonoscopy  -On Lialda given by Dr Carlean Purl   5. HTN -On Lisinopril, will continue -will monitor her BP during chemoRT, and adjust her meds as needed    6. Hypercalcemia -Ca at 11.8 today  -Given prior bone density years ago she was previously on oral calcium which she stopped recently but she does take women's one a day vitamin with calcium, I recommend to hold this for now -she notes she does not drink enough, I encouraged her to push po liquids -Will check PTH, if normal or high, then this could be secondary to her cancer.  -If calcium>12, will consider zometa    PLAN: -Labs, PET scan reviewed -IR PICC placement, lab, f/u and chemo day 1 on 10/5 and start RT -Refill Tramadol, can take 2 tabs q6 PRN, patient will try tylenol and heat for breakthrough  -Restart dulcolax  -Add supplement due to decreased po intake, referral to dietician  -Avoid dietary or supplemental calcium, hold multivitamin  -increase po fluids -Increase activity  -Lab and f/u weekly on treatment    Orders Placed This Encounter  Procedures  . IR Fluoro Guide CV Line Right    PICC line for chemo    Standing Status:   Future    Standing Expiration Date:    04/16/2020    Order Specific Question:   Reason for exam:    Answer:   PICC line for chemo starting 10/5, please place 10/5 AM    Order Specific Question:   Is the patient pregnant?    Answer:   No    Order Specific Question:   Preferred Imaging Location?    Answer:  [facility]  . PTH related peptide    Standing Status:   Future    Standing Expiration Date:   02/15/2020  . Ambulatory referral to Nutrition and Diabetic E    Referral Priority:   Routine    Referral Type:   Consultation    Referral Reason:   Specialty Services Required    Number of Visits Requested:   1   All questions were answered. The patient knows to call the clinic with any problems, questions or concerns. No barriers to learning was detected. I spent 20 minutes counseling the patient face to face. The total time spent in the appointment was 25 minutes and more than 50% was on counseling and review of test results     Alla Feeling, NP 02/15/19

## 2019-02-16 ENCOUNTER — Other Ambulatory Visit: Payer: Self-pay

## 2019-02-16 ENCOUNTER — Telehealth: Payer: Self-pay | Admitting: Nurse Practitioner

## 2019-02-16 ENCOUNTER — Ambulatory Visit
Admission: RE | Admit: 2019-02-16 | Discharge: 2019-02-16 | Disposition: A | Discharge status: Home / Self Care | Payer: BC Managed Care – PPO | Source: Ambulatory Visit | Attending: Radiation Oncology | Admitting: Radiation Oncology

## 2019-02-16 DIAGNOSIS — C20 Malignant neoplasm of rectum: Secondary | ICD-10-CM | POA: Diagnosis present

## 2019-02-16 DIAGNOSIS — C210 Malignant neoplasm of anus, unspecified: Secondary | ICD-10-CM

## 2019-02-16 NOTE — Telephone Encounter (Signed)
Scheduled appt per 9/28 los.  Spoke with pt and she is aware of her appt dates and time.  I informed the patient that her treatment will be added for 10/5.  Patient will prolly stop by scheduling today to get a print out of her appts after her appt with Dr. Lisbeth Renshaw today.

## 2019-02-17 ENCOUNTER — Telehealth: Payer: Self-pay

## 2019-02-17 ENCOUNTER — Other Ambulatory Visit: Payer: Self-pay | Admitting: Hematology

## 2019-02-17 NOTE — Telephone Encounter (Signed)
Patient called about her pain medication Tramadol about out, informed her a refill was sent in on 02/15/2019 by Cira Rue NP.  She states when she takes two of the Tramadol it takes her pain completely away.    She verbalized an understanding and will call the pharmacy.

## 2019-02-18 NOTE — Progress Notes (Signed)
  Radiation Oncology         [phone] ________________________________  Name: Leah Bailey MRN: [id number]  Date: 02/16/2019  DOB: 1955-06-02  SIMULATION AND TREATMENT PLANNING NOTE   DIAGNOSIS:     ICD-10-CM   1. Malignant neoplasm of anus ([place])  C21.0      CONSENT VERIFIED: yes   SET UP: Patient is set-up supine   IMMOBILIZATION: The following immobilization is used: Customized VAC lock bag. This complex treatment device will be used on a daily basis during the patient's treatment.   Diagnosis: Anal cancer   NARRATIVE: The patient was brought to the [facility]. Identity was confirmed. All relevant records and images related to the planned course of therapy were reviewed. Then, the patient was positioned in a stable reproducible clinical set-up for radiation therapy using a customized vac lock bag. Skin markings were placed. The CT images were loaded into the planning software where the target and avoidance structures were contoured.The radiation prescription was entered and confirmed.   The patient will receive 54 Gy in 30 fractions to the high-dose target region.  Daily image guidance is ordered, and this will be used on a daily basis. This is necessary to ensure accurate and precise localization of the target in addition to accurate alignment of the normal tissue structures in this region. This is particularly important given the possible motion of the high-dose target.  Treatment planning then occurred.   I have requested : Intensity Modulated Radiotherapy (IMRT) is medically necessary for this case for the following reason: Dose homogeneity; the target is in close proximity to critical normal structures, including the femoral heads, bladder, and small bowel. IMRT is thus medically to appropriately treat the patient.   Special treatment procedure  The patient will receive chemotherapy during the course of radiation treatment. The patient may  experience increased or overlapping toxicity due to this combined-modality approach and the patient will be monitored for such problems. This may include extra lab  work as necessary. This therefore constitutes a special treatment procedure.     ________________________________  Jodelle Gross, MD, PhD

## 2019-02-19 DIAGNOSIS — C20 Malignant neoplasm of rectum: Secondary | ICD-10-CM | POA: Diagnosis not present

## 2019-02-21 NOTE — Progress Notes (Signed)
[place]   [phone] [phone]   Clinic Follow up Note   Patient Care Team: Harrison Mons, PA as PCP - General (Family Medicine) Jovita Kussmaul, MD as Consulting Physician (General Surgery) Ileana Roup, MD as Consulting Physician (General Surgery) Kyung Rudd, MD as Consulting Physician (Radiation Oncology) Truitt Merle, MD as Consulting Physician (Hematology) Arna Snipe, RN as Oncology Nurse Navigator 02/22/2019  CHIEF COMPLAINT: F/u rectal cancer   SUMMARY OF ONCOLOGIC HISTORY: Oncology History Overview Note  Cancer Staging Squamous cell carcinoma of rectum [facility]) Staging form: Colon and Rectum, AJCC 8th Edition - Clinical stage from 02/04/2019: Stage IIIA (cT2, cN1a, cM0) - Signed by Truitt Merle, MD on 02/04/2019     Malignant neoplasm of anus ([place])  01/13/2019 Initial Diagnosis   Squamous cell carcinoma of rectum ([place])   01/14/2019 Imaging   CT Pelvis W Contrast 01/14/19 IMPRESSION: Perirectal abscess, at the left posterior aspect of the rectum, inseparable from the wall of the colon and the adjacent pelvic musculature. Largest diameter on cranial caudal imaging measures 4.8 cm.   Asymmetric thickening of the rectum associated with the abscess. A perforated rectal carcinoma is favored given the appearance, and correlation with physical exam as well anoscopy/rigid sigmoidoscopy. Alternatively, the changes may reflect chronic inflammation in the setting of inflammatory bowel disease.   There are several small lymph nodes within the perirectal fat and along the left pelvis, the largest measuring 13 mm, potentially pathologic in the setting of carcinoma, or alternatively reactive.   01/15/2019 Initial Diagnosis   Diagnosis 01/15/19 Soft tissue, abscess, rectal wall - INVASIVE SQUAMOUS CELL CARCINOMA, MODERATELY TO POORLY DIFFERENTIATED.   02/03/2019 Imaging   CT Chest W Contrast 02/03/19  IMPRESSION: 1. Two nodules each  measuring about 4 mm in long axis noted in the right upper lobe. One of these is subpleural. These are likely benign but given the context, surveillance imaging in 6-12 months time should be considered. Fleischner criteria for follow do not directly apply given the history of malignancy.   02/04/2019 Cancer Staging   Staging form: Colon and Rectum, AJCC 8th Edition - Clinical stage from 02/04/2019: Stage IIIA (cT2, cN1a, cM0) - Signed by Truitt Merle, MD on 02/04/2019   02/15/2019 PET scan   IMPRESSION: 1. Hypermetabolic eccentric rectal wall thickening with hypermetabolic left perirectal adenopathy. No evidence of distant metastatic disease. 2. Possible gallbladder sludge. 3. Left renal stone.   02/22/2019 -  Chemotherapy   The patient had mitoMYcin (MUTAMYCIN) chemo injection 17 mg, 9.9 mg/m2 = 17.5 mg, Intravenous,  Once, 1 of 1 cycle Administration: 17 mg (02/22/2019) fluorouracil (ADRUCIL) 6,950 mg in sodium chloride 0.9 % 111 mL chemo infusion, 1,000 mg/m2/day = 6,950 mg, Intravenous, 4D (96 hours ), 1 of 1 cycle Administration: 6,950 mg (02/22/2019)  for chemotherapy treatment.      CURRENT THERAPY: ChemoRT with Mitomycin/5FU starting 10/5  INTERVAL HISTORY: Ms. Ricco returns for f/u and treatment as scheduled. She underwent PICC line and started radiation this morning. She is doing better. Energy remains low but she can walk and do light housework. Pain is adequately controlled on tramadol 2 tabs BID with 2 tabs tylenol midday for breakthrough. Rates rectal pain 1/10 currently. She has some rectal bleeding daily, stable. Has 1-2 soft BM per day with dulcolax. Denies recent fever, chills, cough, chest pain, dyspnea, edema, or abdominal pain.    MEDICAL HISTORY:  Past Medical History:  Diagnosis Date   Herpes simplex disciform keratitis 10/20/2013  Hypertension    IBD (inflammatory bowel disease) - colitis 01/11/2018   Nuclear sclerosis of both eyes 11/30/2014   Squamous cell  carcinoma of rectum ([place]) 01/13/2019   UC (ulcerative colitis) ([place])     SURGICAL HISTORY: Past Surgical History:  Procedure Laterality Date   ABDOMINAL HYSTERECTOMY     BREAST CYST ASPIRATION Left 1983   COLONOSCOPY     INCISION AND DRAINAGE PERIRECTAL ABSCESS N/A 01/15/2019   Procedure: INTERNAL DRAINAGE OF PERIRECTAL ABSCESS;  Surgeon: Jovita Kussmaul, MD;  Location: [facility];  Service: General;  Laterality: N/A;   TUBAL LIGATION      I have reviewed the social history and family history with the patient and they are unchanged from previous note.  ALLERGIES:  has No Known Allergies.  MEDICATIONS:  Current Outpatient Medications  Medication Sig Dispense Refill   aspirin 81 MG tablet Take 81 mg by mouth daily.     ferrous sulfate 325 (65 FE) MG EC tablet Take 650 mg by mouth daily with breakfast.      lisinopril-hydrochlorothiazide (PRINZIDE,ZESTORETIC) 20-25 MG tablet Take 1 tablet by mouth daily. 90 tablet 3   mesalamine (LIALDA) 1.2 g EC tablet TAKE 2 TABLETS (2.4 G TOTAL) BY MOUTH DAILY WITH BREAKFAST. 180 tablet 3   Multiple Vitamins-Calcium (ONE-A-DAY WOMENS PO) Take 1 tablet by mouth daily.     ondansetron (ZOFRAN) 8 MG tablet Take 1 tablet (8 mg total) by mouth 2 (two) times daily as needed (Nausea or vomiting). 30 tablet 1   prochlorperazine (COMPAZINE) 10 MG tablet Take 1 tablet (10 mg total) by mouth every 6 (six) hours as needed (Nausea or vomiting). 30 tablet 1   traMADol (ULTRAM) 50 MG tablet Take 1-2 tablets (50-100 mg total) by mouth every 6 (six) hours as needed. 60 tablet 0   naproxen sodium (ALEVE) 220 MG tablet Take 220 mg by mouth daily as needed (pain).      No current facility-administered medications for this visit.    Facility-Administered Medications Ordered in Other Visits  Medication Dose Route Frequency Provider Last Rate Last Dose   fluorouracil (ADRUCIL) 6,950 mg in sodium chloride 0.9 % 111 mL chemo infusion  1,000 mg/m2/day (Treatment  Plan Recorded) Intravenous 4 days Truitt Merle, MD   6,950 mg at 02/22/19 1600   heparin lock flush 100 unit/mL  250 Units Intracatheter Once PRN Truitt Merle, MD       sodium chloride flush (NS) 0.9 % injection 10 mL  10 mL Intracatheter PRN Truitt Merle, MD        PHYSICAL EXAMINATION: ECOG PERFORMANCE STATUS: 1 - Symptomatic but completely ambulatory  Vitals:   02/22/19 1304  BP: 100/76  Pulse: 88  Resp: 18  Temp: 98.9 F (37.2 C)  SpO2: 98%   Filed Weights   02/22/19 1304  Weight: 143 lb 11.2 oz (65.2 kg)    GENERAL:alert, no distress and comfortable SKIN: no rash  EYES: sclera clear LYMPH:  no palpable cervical or supraclavicular lymphadenopathy LUNGS: clear with normal breathing effort HEART: regular rate & rhythm, no lower extremity edema ABDOMEN: abdomen soft, non-tender and normal bowel sounds Musculoskeletal:no cyanosis of digits  NEURO: alert & oriented x 3 with fluent speech, normal gait PICC with bleeding under dressing, placed today 10/5  Rectal exam: external exam unremarkable. Digital rectal exam limited by extreme pain  LABORATORY DATA:  I have reviewed the data as listed CBC Latest Ref Rng & Units 02/22/2019 02/15/2019 02/04/2019  WBC 4.0 - 10.5 K/uL  4.9 5.2 4.9  Hemoglobin 12.0 - 15.0 g/dL 9.5(L) 10.5(L) 9.9(L)  Hematocrit 36.0 - 46.0 % 30.1(L) 32.1(L) 30.9(L)  Platelets 150 - 400 K/uL 354 330 378     CMP Latest Ref Rng & Units 02/22/2019 02/15/2019 02/04/2019  Glucose 70 - 99 mg/dL 100(H) 156(H) 104(H)  BUN 6 - 20 mg/dL _0 Creatinine 0.44 - 1.00 mg/dL 0.93 0.82 0.83  Sodium 135 - 145 mmol/L 141 141 140  Potassium 3.5 - 5.1 mmol/L 4.3 3.9 4.4  Chloride 98 - 111 mmol/L 104 105 104  CO2 22 - 32 mmol/L _1 Calcium 8.9 - 10.3 mg/dL 11.7(H) 11.8(H) 11.4(H)  Total Protein 6.5 - 8.1 g/dL 7.4 7.5 7.6  Total Bilirubin 0.3 - 1.2 mg/dL <0.2(L) 0.3 0.2(L)  Alkaline Phos 38 - 126 U/L 71 68 70  AST 15 - 41 U/L 11(L) 11(L) 13(L)  ALT 0 - 44 U/L _2 RADIOGRAPHIC STUDIES: I have personally reviewed the radiological images as listed and agreed with the findings in the report. Ir Picc Placement Right >5 Yrs Inc Img Guide  Result Date: 02/22/2019 INDICATION: Poor venous access. In need of durable intravenous access for chemotherapy administration. EXAM: ULTRASOUND AND FLUOROSCOPIC GUIDED PICC LINE INSERTION MEDICATIONS: None. CONTRAST:  None FLUOROSCOPY TIME:  18 seconds (2 mGy) COMPLICATIONS: None immediate. TECHNIQUE: The procedure, risks, benefits, and alternatives were explained to the patient and informed written consent was obtained. A timeout was performed prior to the initiation of the procedure. The right upper extremity was prepped with chlorhexidine in a sterile fashion, and a sterile drape was applied covering the operative field. Maximum barrier sterile technique with sterile gowns and gloves were used for the procedure. A timeout was performed prior to the initiation of the procedure. Local anesthesia was provided with 1% lidocaine. Under direct ultrasound guidance, the brachial vein was accessed with a micropuncture kit after the overlying soft tissues were anesthetized with 1% lidocaine. After the overlying soft tissues were anesthetized, a small venotomy incision was created and a micropuncture kit was utilized to access the right brachial vein. Real-time ultrasound guidance was utilized for vascular access including the acquisition of a permanent ultrasound image documenting patency of the accessed vessel. A guidewire was advanced to the level of the superior caval-atrial junction for measurement purposes and the PICC line was cut to length. A peel-away sheath was placed and a 39 cm, 5 [place], single lumen was inserted to level of the superior caval-atrial junction. A post procedure spot fluoroscopic was obtained. The catheter easily aspirated and flushed and was sutured in place. A dressing was placed. The patient tolerated the  procedure well without immediate post procedural complication. FINDINGS: After catheter placement, the tip lies within the superior cavoatrial junction. The catheter aspirates and flushes normally and is ready for immediate use. IMPRESSION: Successful ultrasound and fluoroscopic guided placement of a right brachial vein approach, 39 cm, 5 French, single lumen PICC with tip at the superior caval-atrial junction. The PICC line is ready for immediate use. Electronically Signed   By: Sandi Mariscal M.D.   On: 02/22/2019 09:08     ASSESSMENT & PLAN: Temeka Bodifordis a 63 y.o.African Americanfemalewith a history of HTN, IBD, ulcerative colitis  1.Squamous cell carcinoma of rectum -diagnosed 12/2018. Her CT scans shows low rectal wall thickeningwith abcessand surrounding lymphadenopathy, largest up to 63m. Chest scan also shows 2 tiny 44mright lung nodules which are likely benign.  -  Dr. Burr Medico previously discussed treatment ofrectal squamous cell carcinoma is similar to anal cancer. for locally advanced disease, standard therapy is definitive concurrent chemoRT with Mitomycin and 5FU M-F on week 1 and 5. Discussed the rate of cure is high,and the surgery isreservedfor residual or recurrent disease after chemoradiation. -I previously reviewed staging PET scan which showed hypermetabolic rectal wall thickening with hypermetabolic left perirectal adenopathy, consistent with locally advanced disease. No distant metastasis.  -Ms. Mitcheltree appears stable. Her pain is better controlled. Having soft BM on dulcolax.  -I was not able to do complete internal rectal exam due to significant pain. -She underwent PICC placement and started RT today. There is fresh blood under the clear dressing window, not actively bleeding or saturating dressing. Will monitor closely  -labs reviewed, CBC and CMP stable -she will proceed with cycle 1 day 1 mitomycin and 5FU today. She will return for pump and PICC d/c on  10/9 -we reviewed treatment side effects and symptom management -lab and f/u weekly on treatment  2. Anemia, GI Bleeding -Per patient she has had chronic mildly low anemia. She has been on oral ferrous sulfate.  -This has worsened lately secondary to her cancer and GI bleeding -she is tolerating oral iron   3. Rectal Pain  -Secondary to #1  -Pain not constant but can get up to 10/10. Is exacerbated by eating and BMs.  -On tramadol currently but still not controlling her pain on 2 tabs q5-6 hours PRN -Will titrate pain meds as needed  -pain improved on tramadol 2 tabs BID and 2 tabs tylenol midday for breakthrough   4. Ulcerative Colitis/IBD, Diverticulosis  -Found incidentally on 12/2017 screening colonoscopy  -On Lialda given by Dr Carlean Purl, will f/u with him to see if she will continue this through chemo  5. HTN -On Lisinopril, will continue -will monitor her BP during chemoRT, and adjust her meds as needed  6. Hypercalcemia -Ca at 11.8 today  -Given prior bone density years ago she waspreviouslyon oral calcium which she stopped recently but she does take women's one a day vitamin with calcium, I recommend to hold this for now -she notes she does not drink enough, I encouraged her to push po liquids -PTH related peptide is pending; ifnormal or high, then this couldbe secondary to her cancer.  -Ifcalcium>12, will consider zometa   PLAN: -Labs reviewed -Proceed with cycle 1 day 1 mitomycin and 5FU today -Continue daily RT -Pump and picc d/c 10/9 -Continue pain and bowel regimen  -Lab, f/u weekly  -Reviewed symptom management and concerning symptoms to call/report  All questions were answered. The patient knows to call the clinic with any problems, questions or concerns. No barriers to learning was detected.     Alla Feeling, NP 02/22/19

## 2019-02-22 ENCOUNTER — Telehealth: Payer: Self-pay | Admitting: Nurse Practitioner

## 2019-02-22 ENCOUNTER — Inpatient Hospital Stay: Payer: BC Managed Care – PPO

## 2019-02-22 ENCOUNTER — Ambulatory Visit
Admission: RE | Admit: 2019-02-22 | Discharge: 2019-02-22 | Disposition: A | Discharge status: Home / Self Care | Payer: BC Managed Care – PPO | Source: Ambulatory Visit | Attending: Radiation Oncology | Admitting: Radiation Oncology

## 2019-02-22 ENCOUNTER — Inpatient Hospital Stay (HOSPITAL_BASED_OUTPATIENT_CLINIC_OR_DEPARTMENT_OTHER): Payer: BC Managed Care – PPO | Admitting: Nurse Practitioner

## 2019-02-22 ENCOUNTER — Other Ambulatory Visit: Payer: Self-pay | Admitting: Nurse Practitioner

## 2019-02-22 ENCOUNTER — Encounter: Payer: Self-pay | Admitting: Nurse Practitioner

## 2019-02-22 ENCOUNTER — Other Ambulatory Visit: Payer: Self-pay

## 2019-02-22 ENCOUNTER — Ambulatory Visit (HOSPITAL_COMMUNITY)
Admission: RE | Admit: 2019-02-22 | Discharge: 2019-02-22 | Disposition: A | Discharge status: Home / Self Care | Payer: BC Managed Care – PPO | Source: Ambulatory Visit | Attending: Nurse Practitioner | Admitting: Nurse Practitioner

## 2019-02-22 ENCOUNTER — Inpatient Hospital Stay: Payer: BC Managed Care – PPO | Attending: Hematology

## 2019-02-22 VITALS — BP 100/76 | HR 88 | Temp 98.9°F | Resp 18 | Ht 65.0 in | Wt 143.7 lb

## 2019-02-22 DIAGNOSIS — C20 Malignant neoplasm of rectum: Secondary | ICD-10-CM | POA: Diagnosis not present

## 2019-02-22 DIAGNOSIS — Z5111 Encounter for antineoplastic chemotherapy: Secondary | ICD-10-CM | POA: Diagnosis present

## 2019-02-22 DIAGNOSIS — G893 Neoplasm related pain (acute) (chronic): Secondary | ICD-10-CM | POA: Insufficient documentation

## 2019-02-22 DIAGNOSIS — K5190 Ulcerative colitis, unspecified, without complications: Secondary | ICD-10-CM | POA: Insufficient documentation

## 2019-02-22 DIAGNOSIS — K219 Gastro-esophageal reflux disease without esophagitis: Secondary | ICD-10-CM | POA: Diagnosis not present

## 2019-02-22 DIAGNOSIS — Z95828 Presence of other vascular implants and grafts: Secondary | ICD-10-CM

## 2019-02-22 DIAGNOSIS — I10 Essential (primary) hypertension: Secondary | ICD-10-CM | POA: Diagnosis not present

## 2019-02-22 DIAGNOSIS — K1230 Oral mucositis (ulcerative), unspecified: Secondary | ICD-10-CM | POA: Diagnosis not present

## 2019-02-22 DIAGNOSIS — D6481 Anemia due to antineoplastic chemotherapy: Secondary | ICD-10-CM | POA: Diagnosis not present

## 2019-02-22 DIAGNOSIS — Z23 Encounter for immunization: Secondary | ICD-10-CM | POA: Diagnosis not present

## 2019-02-22 DIAGNOSIS — C210 Malignant neoplasm of anus, unspecified: Secondary | ICD-10-CM

## 2019-02-22 LAB — CBC WITH DIFFERENTIAL (CANCER CENTER ONLY)
Abs Immature Granulocytes: 0.01 10*3/uL (ref 0.00–0.07)
Basophils Absolute: 0 10*3/uL (ref 0.0–0.1)
Basophils Relative: 0 %
Eosinophils Absolute: 0.1 10*3/uL (ref 0.0–0.5)
Eosinophils Relative: 1 %
HCT: 30.1 % — ABNORMAL LOW (ref 36.0–46.0)
Hemoglobin: 9.5 g/dL — ABNORMAL LOW (ref 12.0–15.0)
Immature Granulocytes: 0 %
Lymphocytes Relative: 33 %
Lymphs Abs: 1.6 10*3/uL (ref 0.7–4.0)
MCH: 27.8 pg (ref 26.0–34.0)
MCHC: 31.6 g/dL (ref 30.0–36.0)
MCV: 88 fL (ref 80.0–100.0)
Monocytes Absolute: 0.3 10*3/uL (ref 0.1–1.0)
Monocytes Relative: 7 %
Neutro Abs: 2.9 10*3/uL (ref 1.7–7.7)
Neutrophils Relative %: 59 %
Platelet Count: 354 10*3/uL (ref 150–400)
RBC: 3.42 MIL/uL — ABNORMAL LOW (ref 3.87–5.11)
RDW: 13.3 % (ref 11.5–15.5)
WBC Count: 4.9 10*3/uL (ref 4.0–10.5)
nRBC: 0 % (ref 0.0–0.2)

## 2019-02-22 LAB — CMP (CANCER CENTER ONLY)
ALT: 12 U/L (ref 0–44)
AST: 11 U/L — ABNORMAL LOW (ref 15–41)
Albumin: 3.9 g/dL (ref 3.5–5.0)
Alkaline Phosphatase: 71 U/L (ref 38–126)
Anion gap: 7 (ref 5–15)
BUN: 8 mg/dL (ref 6–20)
CO2: 30 mmol/L (ref 22–32)
Calcium: 11.7 mg/dL — ABNORMAL HIGH (ref 8.9–10.3)
Chloride: 104 mmol/L (ref 98–111)
Creatinine: 0.93 mg/dL (ref 0.44–1.00)
GFR, Est AFR Am: >60 mL/min (ref >60)
GFR, Estimated: >60 mL/min (ref >60)
Glucose, Bld: 100 mg/dL — ABNORMAL HIGH (ref 70–99)
Potassium: 4.3 mmol/L (ref 3.5–5.1)
Sodium: 141 mmol/L (ref 135–145)
Total Bilirubin: <0.2 mg/dL — ABNORMAL LOW (ref 0.3–1.2)
Total Protein: 7.4 g/dL (ref 6.5–8.1)

## 2019-02-22 MED ORDER — SODIUM CHLORIDE 0.9% FLUSH
10.0000 mL | INTRAVENOUS | Status: DC | PRN
  Administered 2019-02-22: 10 mL via INTRAVENOUS
  Filled 2019-02-22: qty 10

## 2019-02-22 MED ORDER — PROCHLORPERAZINE MALEATE 10 MG PO TABS
ORAL_TABLET | ORAL | Status: AC
  Filled 2019-02-22: qty 1

## 2019-02-22 MED ORDER — LIDOCAINE HCL 1 % IJ SOLN
INTRAMUSCULAR | Status: AC
  Filled 2019-02-22: qty 20

## 2019-02-22 MED ORDER — SODIUM CHLORIDE 0.9% FLUSH
10.0000 mL | INTRAVENOUS | Status: DC | PRN
  Filled 2019-02-22: qty 10

## 2019-02-22 MED ORDER — PROCHLORPERAZINE MALEATE 10 MG PO TABS
10.0000 mg | ORAL_TABLET | Freq: Once | ORAL | Status: AC
  Administered 2019-02-22: 15:00:00 10 mg via ORAL

## 2019-02-22 MED ORDER — HEPARIN SOD (PORK) LOCK FLUSH 100 UNIT/ML IV SOLN
500.0000 [IU] | Freq: Once | INTRAVENOUS | Status: DC
  Filled 2019-02-22: qty 5

## 2019-02-22 MED ORDER — MITOMYCIN CHEMO IV INJECTION 20 MG
9.9000 mg/m2 | Freq: Once | INTRAVENOUS | Status: AC
  Administered 2019-02-22: 17 mg via INTRAVENOUS
  Filled 2019-02-22: qty 34

## 2019-02-22 MED ORDER — LIDOCAINE HCL 1 % IJ SOLN
INTRAMUSCULAR | Status: AC | PRN
  Administered 2019-02-22: 5 mL

## 2019-02-22 MED ORDER — SODIUM CHLORIDE 0.9 % IV SOLN
1000.0000 mg/m2/d | INTRAVENOUS | Status: DC
  Administered 2019-02-22: 16:00:00 6950 mg via INTRAVENOUS
  Filled 2019-02-22: qty 139

## 2019-02-22 MED ORDER — SODIUM CHLORIDE 0.9 % IV SOLN
Freq: Once | INTRAVENOUS | Status: AC
  Administered 2019-02-22: 14:00:00 via INTRAVENOUS
  Filled 2019-02-22: qty 250

## 2019-02-22 MED ORDER — HEPARIN SOD (PORK) LOCK FLUSH 100 UNIT/ML IV SOLN
250.0000 [IU] | Freq: Once | INTRAVENOUS | Status: DC | PRN
  Filled 2019-02-22: qty 5

## 2019-02-22 NOTE — Progress Notes (Signed)
Patient d/c with pump. No new drainage on PICC dressing noted. Clot is forming.  Securing device not changed as not to disturb the clot. No active bleeding noted. The patient will return tomorrow for assessment of PICC and dressing change.

## 2019-02-22 NOTE — Discharge Instructions (Signed)
PICC Home Care Guide  A peripherally inserted central catheter (PICC) is a form of IV access that allows medicines and IV fluids to be quickly distributed throughout the body. The PICC is a long, thin, flexible tube (catheter) that is inserted into a vein in the upper arm. The catheter ends in a large vein in the chest (superior vena cava, or SVC). After the PICC is inserted, a chest X-ray may be done to make sure that it is in the correct place. A PICC may be placed for different reasons, such as:  To give medicines and liquid nutrition.  To give IV fluids and blood products.  If there is trouble placing a peripheral intravenous (PIV) catheter. If taken care of properly, a PICC can remain in place for several months. Having a PICC can also allow a person to go home from the hospital sooner. Medicine and PICC care can be managed at home by a family member, caregiver, or home health care team. What are the risks? Generally, having a PICC is safe. However, problems may occur, including:  A blood clot (thrombus) forming in or at the tip of the PICC.  A blood clot forming in a vein (deep vein thrombosis) or traveling to the lung (pulmonary embolism).  Inflammation of the vein (phlebitis) in which the PICC is placed.  Infection. Central line associated blood stream infection (CLABSI) is a serious infection that often requires hospitalization.  PICC movement (malposition). The PICC tip may move from its original position due to excessive physical activity, forceful coughing, sneezing, or vomiting.  A break or cut in the PICC. It is important not to use scissors near the PICC.  Nerve or tendon irritation or injury during PICC insertion. How to take care of your PICC Preventing problems  You and any caregivers should wash your hands often with soap. Wash hands: ? Before touching the PICC line or the infusion device. ? Before changing a bandage (dressing).  Flush the PICC as told by your  health care provider. Let your health care provider know right away if the PICC is hard to flush or does not flush. Do not use force to flush the PICC.  Do not use a syringe that is less than 10 mL to flush the PICC.  Avoid blood pressure checks on the arm in which the PICC is placed.  Never pull or tug on the PICC.  Do not take the PICC out yourself. Only a trained clinical professional should remove the PICC.  Use clean and sterile supplies only. Keep the supplies in a dry place. Do not reuse needles, syringes, or any other supplies. Doing that can lead to infection.  Keep pets and children away from your PICC line.  Check the PICC insertion site every day for signs of infection. Check for: ? Leakage. ? Redness, swelling, or pain. ? Fluid or blood. ? Warmth. ? Pus or a bad smell. PICC dressing care  Keep your PICC bandage (dressing) clean and dry to prevent infection.  Do not take baths, swim, or use a hot tub until your health care provider approves. Ask your health care provider if you can take showers. You may only be allowed to take sponge baths for bathing. When you are allowed to shower: ? Ask your health care provider to teach you how to wrap the PICC line. ? Cover the PICC line with clear plastic wrap and tape to keep it dry while showering.  Follow instructions from your health care provider  about how to take care of your insertion site and dressing. Make sure you: ? Wash your hands with soap and water before you change your bandage (dressing). If soap and water are not available, use hand sanitizer. ? Change your dressing as told by your health care provider. ? Leave stitches (sutures), skin glue, or adhesive strips in place. These skin closures may need to stay in place for 2 weeks or longer. If adhesive strip edges start to loosen and curl up, you may trim the loose edges. Do not remove adhesive strips completely unless your health care provider tells you to do  that.  Change your PICC dressing if it becomes loose or wet. General instructions   Carry your PICC identification card or wear a medical alert bracelet at all times.  Keep the tube clamped at all times, unless it is being used.  Carry a smooth-edge clamp with you at all times to place on the tube if it breaks.  Do not use scissors or sharp objects near the tube.  You may bend your arm and move it freely. If your PICC is near or at the bend of your elbow, avoid activity with repeated motion at the elbow.  Avoid lifting heavy objects as told by your health care provider.  Keep all follow-up visits as told by your health care provider. This is important. Disposal of supplies  Throw away any syringes in a disposal container that is meant for sharp items (sharps container). You can buy a sharps container from a pharmacy, or you can make one by using an empty hard plastic bottle with a cover.  Place any used dressings or infusion bags into a plastic bag. Throw that bag in the trash. Contact a health care provider if:  You have pain in your arm, ear, face, or teeth.  You have a fever or chills.  You have redness, swelling, or pain around the insertion site.  You have fluid or blood coming from the insertion site.  Your insertion site feels warm to the touch.  You have pus or a bad smell coming from the insertion site.  Your skin feels hard and raised around the insertion site. Get help right away if:  Your PICC is accidentally pulled all the way out. If this happens, cover the insertion site with a bandage or gauze dressing. Do not throw the PICC away. Your health care provider will need to check it.  Your PICC was tugged or pulled and has partially come out. Do not  push the PICC back in.  You cannot flush the PICC, it is hard to flush, or the PICC leaks around the insertion site when it is flushed.  You hear a "flushing" sound when the PICC is flushed.  You feel your  heart racing or skipping beats.  There is a hole or tear in the PICC.  You have swelling in the arm in which the PICC was inserted.  You have a red streak going up your arm from where the PICC was inserted. Summary  A peripherally inserted central catheter (PICC) is a long, thin, flexible tube (catheter) that is inserted into a vein in the upper arm.  The PICC is inserted using a sterile technique by a specially trained nurse or physician. Only a trained clinical professional should remove it.  Keep your PICC identification card with you at all times.  Avoid blood pressure checks on the arm in which the PICC is placed.  If cared for  properly, a PICC can remain in place for several months. Having a PICC can also allow a person to go home from the hospital sooner. This information is not intended to replace advice given to you by your health care provider. Make sure you discuss any questions you have with your health care provider. Document Released: 11/10/2002 Document Revised: 04/18/2017 Document Reviewed: 06/08/2016 Elsevier Patient Education  2020 [employer].

## 2019-02-22 NOTE — Telephone Encounter (Signed)
Scheduled appt per 10/5 sch message - unable to reach pt. Left message with appt date and time

## 2019-02-22 NOTE — Progress Notes (Signed)
Bloody PICC dressing and saturated biopatch-- removed old dressing and biopatch and replaced with sterile biopatch and new transparent dressing. No active bleeding and I did not disturb the clot present over catheter exit site.

## 2019-02-22 NOTE — Procedures (Signed)
Pre procedural Diagnosis: Poor venous access Post Procedural Diagnosis: Same  Successful placement of right brachial vein approach 39 cm single lumen PICC line with tip at the superior caval-atrial junction.    EBL: None No immediate post procedural complication.  The PICC line is ready for immediate use.  Ronny Bacon, MD Pager #: [phone]

## 2019-02-22 NOTE — Patient Instructions (Signed)
[place] Discharge Instructions for Patients Receiving Chemotherapy  Today you received the following chemotherapy agents mitomycin; adrucil  To help prevent nausea and vomiting after your treatment, we encourage you to take your nausea medication as directed   If you develop nausea and vomiting that is not controlled by your nausea medication, call the clinic.   BELOW ARE SYMPTOMS THAT SHOULD BE REPORTED IMMEDIATELY:  *FEVER GREATER THAN 100.5 F  *CHILLS WITH OR WITHOUT FEVER  NAUSEA AND VOMITING THAT IS NOT CONTROLLED WITH YOUR NAUSEA MEDICATION  *UNUSUAL SHORTNESS OF BREATH  *UNUSUAL BRUISING OR BLEEDING  TENDERNESS IN MOUTH AND THROAT WITH OR WITHOUT PRESENCE OF ULCERS  *URINARY PROBLEMS  *BOWEL PROBLEMS  UNUSUAL RASH Items with * indicate a potential emergency and should be followed up as soon as possible.  Feel free to call the clinic should you have any questions or concerns. The clinic phone number is [phone].  Please show the [place] at check-in to the Emergency Department and triage nurse.  Mitomycin injection What is this medicine? MITOMYCIN (mye toe MYE sin) is a chemotherapy drug. This medicine is used to treat cancer of the stomach and pancreas. This medicine may be used for other purposes; ask your health care provider or pharmacist if you have questions. COMMON BRAND NAME(S): Mutamycin What should I tell my health care provider before I take this medicine? They need to know if you have any of these conditions:  anemia  bleeding disorder  infection (especially a virus infection such as chickenpox, cold sores, or herpes)  kidney disease  low blood counts like low platelets, red blood cells, white blood cells  recent radiation therapy  an unusual or allergic reaction to mitomycin, other chemotherapy agents, other medicines, foods, dyes, or preservatives  pregnant or trying to get pregnant  breast-feeding How  should I use this medicine? This drug is given as an injection or infusion into a vein. It is administered in a hospital or clinic by a specially trained health care professional. Talk to your pediatrician regarding the use of this medicine in children. Special care may be needed. Overdosage: If you think you have taken too much of this medicine contact a poison control center or emergency room at once. NOTE: This medicine is only for you. Do not share this medicine with others. What if I miss a dose? It is important not to miss your dose. Call your doctor or health care professional if you are unable to keep an appointment. What may interact with this medicine?  medicines to increase blood counts like filgrastim, pegfilgrastim, sargramostim  vaccines This list may not describe all possible interactions. Give your health care provider a list of all the medicines, herbs, non-prescription drugs, or dietary supplements you use. Also tell them if you smoke, drink alcohol, or use illegal drugs. Some items may interact with your medicine. What should I watch for while using this medicine? Your condition will be monitored carefully while you are receiving this medicine. You will need important blood work done while you are taking this medicine. This drug may make you feel generally unwell. This is not uncommon, as chemotherapy can affect healthy cells as well as cancer cells. Report any side effects. Continue your course of treatment even though you feel ill unless your doctor tells you to stop. Call your doctor or health care professional for advice if you get a fever, chills or sore throat, or other symptoms of a cold or flu.  Do not treat yourself. This drug decreases your body's ability to fight infections. Try to avoid being around people who are sick. This medicine may increase your risk to bruise or bleed. Call your doctor or health care professional if you notice any unusual bleeding. Be careful  brushing and flossing your teeth or using a toothpick because you may get an infection or bleed more easily. If you have any dental work done, tell your dentist you are receiving this medicine. Avoid taking products that contain aspirin, acetaminophen, ibuprofen, naproxen, or ketoprofen unless instructed by your doctor. These medicines may hide a fever. Do not become pregnant while taking this medicine. Women should inform their doctor if they wish to become pregnant or think they might be pregnant. There is a potential for serious side effects to an unborn child. Talk to your health care professional or pharmacist for more information. Do not breast-feed an infant while taking this medicine. What side effects may I notice from receiving this medicine? Side effects that you should report to your doctor or health care professional as soon as possible:  allergic reactions like skin rash, itching or hives, swelling of the face, lips, or tongue  low blood counts - this medicine may decrease the number of white blood cells, red blood cells and platelets. You may be at increased risk for infections and bleeding.  signs of infection - fever or chills, cough, sore throat, pain or difficulty passing urine  signs of decreased platelets or bleeding - bruising, pinpoint red spots on the skin, black, tarry stools, blood in the urine  signs of decreased red blood cells - unusually weak or tired, fainting spells, lightheadedness  breathing problems  changes in vision  chest pain  confusion  dry cough  high blood pressure  mouth sores  pain, swelling, redness at site where injected  pain, tingling, numbness in the hands or feet  seizures  swelling of the ankles, feet, hands  trouble passing urine or change in the amount of urine Side effects that usually do not require medical attention (report to your doctor or health care professional if they continue or are bothersome):  diarrhea  green  to blue color of urine  hair loss  loss of appetite  nausea, vomiting This list may not describe all possible side effects. Call your doctor for medical advice about side effects. You may report side effects to FDA at 1-800-FDA-1088. Where should I keep my medicine? This drug is given in a hospital or clinic and will not be stored at home. NOTE: This sheet is a summary. It may not cover all possible information. If you have questions about this medicine, talk to your doctor, pharmacist, or health care provider.  2020 Elsevier/Gold Standard (2007-11-12 11:16:23)  Fluorouracil, 5-FU injection What is this medicine? FLUOROURACIL, 5-FU (flure oh YOOR a sil) is a chemotherapy drug. It slows the growth of cancer cells. This medicine is used to treat many types of cancer like breast cancer, colon or rectal cancer, pancreatic cancer, and stomach cancer. This medicine may be used for other purposes; ask your health care provider or pharmacist if you have questions. COMMON BRAND NAME(S): Adrucil What should I tell my health care provider before I take this medicine? They need to know if you have any of these conditions:  blood disorders  dihydropyrimidine dehydrogenase (DPD) deficiency  infection (especially a virus infection such as chickenpox, cold sores, or herpes)  kidney disease  liver disease  malnourished, poor nutrition  recent or ongoing radiation therapy  an unusual or allergic reaction to fluorouracil, other chemotherapy, other medicines, foods, dyes, or preservatives  pregnant or trying to get pregnant  breast-feeding How should I use this medicine? This drug is given as an infusion or injection into a vein. It is administered in a hospital or clinic by a specially trained health care professional. Talk to your pediatrician regarding the use of this medicine in children. Special care may be needed. Overdosage: If you think you have taken too much of this medicine contact  a poison control center or emergency room at once. NOTE: This medicine is only for you. Do not share this medicine with others. What if I miss a dose? It is important not to miss your dose. Call your doctor or health care professional if you are unable to keep an appointment. What may interact with this medicine?  allopurinol  cimetidine  dapsone  digoxin  hydroxyurea  leucovorin  levamisole  medicines for seizures like ethotoin, fosphenytoin, phenytoin  medicines to increase blood counts like filgrastim, pegfilgrastim, sargramostim  medicines that treat or prevent blood clots like warfarin, enoxaparin, and dalteparin  methotrexate  metronidazole  pyrimethamine  some other chemotherapy drugs like busulfan, cisplatin, estramustine, vinblastine  trimethoprim  trimetrexate  vaccines Talk to your doctor or health care professional before taking any of these medicines:  acetaminophen  aspirin  ibuprofen  ketoprofen  naproxen This list may not describe all possible interactions. Give your health care provider a list of all the medicines, herbs, non-prescription drugs, or dietary supplements you use. Also tell them if you smoke, drink alcohol, or use illegal drugs. Some items may interact with your medicine. What should I watch for while using this medicine? Visit your doctor for checks on your progress. This drug may make you feel generally unwell. This is not uncommon, as chemotherapy can affect healthy cells as well as cancer cells. Report any side effects. Continue your course of treatment even though you feel ill unless your doctor tells you to stop. In some cases, you may be given additional medicines to help with side effects. Follow all directions for their use. Call your doctor or health care professional for advice if you get a fever, chills or sore throat, or other symptoms of a cold or flu. Do not treat yourself. This drug decreases your body's ability to  fight infections. Try to avoid being around people who are sick. This medicine may increase your risk to bruise or bleed. Call your doctor or health care professional if you notice any unusual bleeding. Be careful brushing and flossing your teeth or using a toothpick because you may get an infection or bleed more easily. If you have any dental work done, tell your dentist you are receiving this medicine. Avoid taking products that contain aspirin, acetaminophen, ibuprofen, naproxen, or ketoprofen unless instructed by your doctor. These medicines may hide a fever. Do not become pregnant while taking this medicine. Women should inform their doctor if they wish to become pregnant or think they might be pregnant. There is a potential for serious side effects to an unborn child. Talk to your health care professional or pharmacist for more information. Do not breast-feed an infant while taking this medicine. Men should inform their doctor if they wish to father a child. This medicine may lower sperm counts. Do not treat diarrhea with over the counter products. Contact your doctor if you have diarrhea that lasts more than 2 days or if it  is severe and watery. This medicine can make you more sensitive to the sun. Keep out of the sun. If you cannot avoid being in the sun, wear protective clothing and use sunscreen. Do not use sun lamps or tanning beds/booths. What side effects may I notice from receiving this medicine? Side effects that you should report to your doctor or health care professional as soon as possible:  allergic reactions like skin rash, itching or hives, swelling of the face, lips, or tongue  low blood counts - this medicine may decrease the number of white blood cells, red blood cells and platelets. You may be at increased risk for infections and bleeding.  signs of infection - fever or chills, cough, sore throat, pain or difficulty passing urine  signs of decreased platelets or bleeding -  bruising, pinpoint red spots on the skin, black, tarry stools, blood in the urine  signs of decreased red blood cells - unusually weak or tired, fainting spells, lightheadedness  breathing problems  changes in vision  chest pain  mouth sores  nausea and vomiting  pain, swelling, redness at site where injected  pain, tingling, numbness in the hands or feet  redness, swelling, or sores on hands or feet  stomach pain  unusual bleeding Side effects that usually do not require medical attention (report to your doctor or health care professional if they continue or are bothersome):  changes in finger or toe nails  diarrhea  dry or itchy skin  hair loss  headache  loss of appetite  sensitivity of eyes to the light  stomach upset  unusually teary eyes This list may not describe all possible side effects. Call your doctor for medical advice about side effects. You may report side effects to FDA at 1-800-FDA-1088. Where should I keep my medicine? This drug is given in a hospital or clinic and will not be stored at home. NOTE: This sheet is a summary. It may not cover all possible information. If you have questions about this medicine, talk to your doctor, pharmacist, or health care provider.  2020 Elsevier/Gold Standard (2007-09-09 13:53:16)

## 2019-02-22 NOTE — Progress Notes (Signed)
picc bleeding. [facility] nurse [place] aware. Dressing will be changed in infusion.

## 2019-02-23 ENCOUNTER — Ambulatory Visit
Admission: RE | Admit: 2019-02-23 | Discharge: 2019-02-23 | Disposition: A | Discharge status: Home / Self Care | Payer: BC Managed Care – PPO | Source: Ambulatory Visit | Attending: Radiation Oncology | Admitting: Radiation Oncology

## 2019-02-23 ENCOUNTER — Other Ambulatory Visit: Payer: Self-pay

## 2019-02-23 ENCOUNTER — Telehealth: Payer: Self-pay | Admitting: *Deleted

## 2019-02-23 ENCOUNTER — Telehealth: Payer: Self-pay | Admitting: Nurse Practitioner

## 2019-02-23 ENCOUNTER — Telehealth: Payer: Self-pay

## 2019-02-23 ENCOUNTER — Inpatient Hospital Stay: Payer: BC Managed Care – PPO | Admitting: Nutrition

## 2019-02-23 ENCOUNTER — Inpatient Hospital Stay: Payer: BC Managed Care – PPO

## 2019-02-23 DIAGNOSIS — Z95828 Presence of other vascular implants and grafts: Secondary | ICD-10-CM

## 2019-02-23 DIAGNOSIS — C20 Malignant neoplasm of rectum: Secondary | ICD-10-CM | POA: Diagnosis not present

## 2019-02-23 MED ORDER — HEPARIN SOD (PORK) LOCK FLUSH 100 UNIT/ML IV SOLN
500.0000 [IU] | Freq: Once | INTRAVENOUS | Status: DC
  Filled 2019-02-23: qty 5

## 2019-02-23 MED ORDER — SODIUM CHLORIDE 0.9% FLUSH
10.0000 mL | INTRAVENOUS | Status: DC | PRN
  Filled 2019-02-23: qty 10

## 2019-02-23 NOTE — Progress Notes (Signed)
Patient was contacted by telephone.  63 year old female diagnosed with anal cancer.  She is a patient of Dr. Burr Medico and Dr. Lisbeth Renshaw.  She is receiving concurrent chemoradiation therapy.  Past medical history includes hypertension, IBD, and ulcerative colitis.  Medications include multivitamin, Zofran, and Compazine.  Labs include glucose 156.  Height: 5 feet 5 inches. Weight: 143.7 pounds on October 5. Usual body weight: 158 pounds in May 2020. BMI: 23.91.  Patient reports she is feeling better. Constipation has improved and she is having soft bowel movements every day. She is eating more food and more often. She is increasing fruits and vegetables and water. She did take nausea medicine last night and it was effective. She is afraid she is beginning to lose her appetite.  Nutrition diagnosis: Food and nutrition related knowledge deficit related to anal cancer and associated treatments as evidenced by no prior need for nutrition related information.  Intervention: Educated patient to consume soft high-protein foods frequently throughout the day. Recommended patient consume oral nutrition supplements/smoothies. Educated patient on strategies for improving nausea and vomiting, poor appetite, and constipation. E-Mailed handouts to patient. Questions were answered.  Teach back method used.  Contact information has been provided.  Monitoring, evaluation, goals: Patient will tolerate adequate calories and protein to minimize weight loss.  Next visit: Patient will contact me with questions or concerns.  **Disclaimer: This note was dictated with voice recognition software. Similar sounding words can inadvertently be transcribed and this note may contain transcription errors which may not have been corrected upon publication of note.**

## 2019-02-23 NOTE — Telephone Encounter (Signed)
No los per 10/5.

## 2019-02-23 NOTE — Telephone Encounter (Signed)
-----   Message from Alla Feeling, NP sent at 02/23/2019  1:56 PM EDT ----- OK, thanks I appreciate the response.   Santiago Glad, can you please let her know the recommendation is to continue lialda.  Thanks, Lacie  ----- Message ----- From: Gatha Mayer, MD Sent: 02/23/2019   1:34 PM EDT To: Alla Feeling, NP  I would continue it  It only works in the colon at the mucosal level w/o sig systemic absorption so I think its fine for her to use ----- Message ----- From: Alla Feeling, NP Sent: 02/22/2019   5:36 PM EDT To: Gatha Mayer, MD  Dr. Carlean Purl,  She is on mesalamine for UC; she started immunosuppressive chemoRT today. Can she hold this during 5-6 weeks of treatment?   Thanks, Cira Rue, NP

## 2019-02-23 NOTE — Telephone Encounter (Signed)
-----   Message from Priscille Loveless, RN sent at 02/22/2019  3:43 PM EDT ----- Regarding: first chemo f/u Burr Medico First chemo f/u mitomycin; 5 FU

## 2019-02-23 NOTE — Telephone Encounter (Signed)
Pt. Called informed Pt. per Lacie Dr. Celesta Aver recommendation was to continue Lialda(mesalamine). Pt verbalized understanding.

## 2019-02-23 NOTE — Telephone Encounter (Signed)
Called pt to see how she did with her treatment yesterday with Mit C & Fluorouracil.  She reports adjusting well.  She states that she has a couple of episodes of nausea & took her meds & that helped.  She had some stomach pain last hs but it went away.  She denies any other symptoms but states she knows how to reach [place] if she needs [place]. She will return Fri for PICC D/C.

## 2019-02-24 ENCOUNTER — Ambulatory Visit
Admission: RE | Admit: 2019-02-24 | Discharge: 2019-02-24 | Disposition: A | Discharge status: Home / Self Care | Payer: BC Managed Care – PPO | Source: Ambulatory Visit | Attending: Radiation Oncology | Admitting: Radiation Oncology

## 2019-02-24 ENCOUNTER — Other Ambulatory Visit: Payer: Self-pay

## 2019-02-24 DIAGNOSIS — C20 Malignant neoplasm of rectum: Secondary | ICD-10-CM | POA: Diagnosis not present

## 2019-02-25 ENCOUNTER — Other Ambulatory Visit: Payer: Self-pay

## 2019-02-25 ENCOUNTER — Ambulatory Visit
Admission: RE | Admit: 2019-02-25 | Discharge: 2019-02-25 | Disposition: A | Discharge status: Home / Self Care | Payer: BC Managed Care – PPO | Source: Ambulatory Visit | Attending: Radiation Oncology | Admitting: Radiation Oncology

## 2019-02-25 DIAGNOSIS — C20 Malignant neoplasm of rectum: Secondary | ICD-10-CM | POA: Diagnosis not present

## 2019-02-25 NOTE — Progress Notes (Signed)
[place]   [phone] [phone]) [phone]   Clinic Follow up Note   Patient Care Team: Harrison Mons, PA as PCP - General (Family Medicine) Jovita Kussmaul, MD as Consulting Physician (General Surgery) Ileana Roup, MD as Consulting Physician (General Surgery) Kyung Rudd, MD as Consulting Physician (Radiation Oncology) Truitt Merle, MD as Consulting Physician (Hematology) Arna Snipe, RN as Oncology Nurse Navigator  Date of Service:  03/01/2019  CHIEF COMPLAINT: F/u rectal cancer  SUMMARY OF ONCOLOGIC HISTORY: Oncology History Overview Note  Cancer Staging Squamous cell carcinoma of rectum [facility]) Staging form: Colon and Rectum, AJCC 8th Edition - Clinical stage from 02/04/2019: Stage IIIA (cT2, cN1a, cM0) - Signed by Truitt Merle, MD on 02/04/2019     Malignant neoplasm of anus ([place])  01/13/2019 Initial Diagnosis   Squamous cell carcinoma of rectum ([place])   01/14/2019 Imaging   CT Pelvis W Contrast 01/14/19 IMPRESSION: Perirectal abscess, at the left posterior aspect of the rectum, inseparable from the wall of the colon and the adjacent pelvic musculature. Largest diameter on cranial caudal imaging measures 4.8 cm.   Asymmetric thickening of the rectum associated with the abscess. A perforated rectal carcinoma is favored given the appearance, and correlation with physical exam as well anoscopy/rigid sigmoidoscopy. Alternatively, the changes may reflect chronic inflammation in the setting of inflammatory bowel disease.   There are several small lymph nodes within the perirectal fat and along the left pelvis, the largest measuring 13 mm, potentially pathologic in the setting of carcinoma, or alternatively reactive.   01/15/2019 Initial Diagnosis   Diagnosis 01/15/19 Soft tissue, abscess, rectal wall - INVASIVE SQUAMOUS CELL CARCINOMA, MODERATELY TO POORLY DIFFERENTIATED.   02/03/2019 Imaging   CT Chest W Contrast 02/03/19  IMPRESSION: 1.  Two nodules each measuring about 4 mm in long axis noted in the right upper lobe. One of these is subpleural. These are likely benign but given the context, surveillance imaging in 6-12 months time should be considered. Fleischner criteria for follow do not directly apply given the history of malignancy.   02/04/2019 Cancer Staging   Staging form: Colon and Rectum, AJCC 8th Edition - Clinical stage from 02/04/2019: Stage IIIA (cT2, cN1a, cM0) - Signed by Truitt Merle, MD on 02/04/2019   02/15/2019 PET scan   IMPRESSION: 1. Hypermetabolic eccentric rectal wall thickening with hypermetabolic left perirectal adenopathy. No evidence of distant metastatic disease. 2. Possible gallbladder sludge. 3. Left renal stone.   02/22/2019 -  Chemotherapy   ChemoRT with Mitomycin/5FU week 1 and 5 starting 02/22/19   02/22/2019 -  Radiation Therapy   ChemoRT with Dr. Sondra Come starting 02/22/19      CURRENT THERAPY:  ChemoRT with Mitomycin/5FU week 1 and 5 starting 02/22/19  INTERVAL HISTORY:  Leah Bailey is here for a follow up of treatment. She presents to the clinic alone. She notes her week one of radiation went well. She notes burning from her chest to her vaginal area. She notes mouth sores with no pain. She has been using magic mouthwash and baking soda rinse. She has not taken Tramadol often. She denies true pain but feels more uncomfortable. She denies N&V and has been able to eat and maintain her weight. She has been drinking coconut water. She notes her BM are regular. She has Doculax as needed. She notes mild rectal bleeding with some BMs.     REVIEW OF SYSTEMS:   Constitutional: Denies fevers, chills or abnormal weight loss Eyes: Denies blurriness of vision Ears,  nose, mouth, throat, and face: (+) Mild mouth sores  Respiratory: Denies cough, dyspnea or wheezes Cardiovascular: Denies palpitation, chest discomfort or lower extremity swelling Gastrointestinal:  Denies nausea or change in  bowel habits (+) Acid reflux (+) Very mild rectal bleeding.  Skin: Denies abnormal skin rashes Lymphatics: Denies new lymphadenopathy or easy bruising Neurological:Denies numbness, tingling or new weaknesses Behavioral/Psych: Mood is stable, no new changes  All other systems were reviewed with the patient and are negative.  MEDICAL HISTORY:  Past Medical History:  Diagnosis Date  . Herpes simplex disciform keratitis 10/20/2013  . Hypertension   . IBD (inflammatory bowel disease) - colitis 01/11/2018  . Nuclear sclerosis of both eyes 11/30/2014  . Squamous cell carcinoma of rectum ([place]) 01/13/2019  . UC (ulcerative colitis) ([place])     SURGICAL HISTORY: Past Surgical History:  Procedure Laterality Date  . ABDOMINAL HYSTERECTOMY    . BREAST CYST ASPIRATION Left 1983  . COLONOSCOPY    . INCISION AND DRAINAGE PERIRECTAL ABSCESS N/A 01/15/2019   Procedure: INTERNAL DRAINAGE OF PERIRECTAL ABSCESS;  Surgeon: Jovita Kussmaul, MD;  Location: [facility];  Service: General;  Laterality: N/A;  . TUBAL LIGATION      I have reviewed the social history and family history with the patient and they are unchanged from previous note.  ALLERGIES:  has No Known Allergies.  MEDICATIONS:  Current Outpatient Medications  Medication Sig Dispense Refill  . aspirin 81 MG tablet Take 81 mg by mouth daily.    . ferrous sulfate 325 (65 FE) MG EC tablet Take 650 mg by mouth daily with breakfast.     . lisinopril-hydrochlorothiazide (PRINZIDE,ZESTORETIC) 20-25 MG tablet Take 1 tablet by mouth daily. 90 tablet 3  . mesalamine (LIALDA) 1.2 g EC tablet TAKE 2 TABLETS (2.4 G TOTAL) BY MOUTH DAILY WITH BREAKFAST. 180 tablet 3  . Multiple Vitamins-Calcium (ONE-A-DAY WOMENS PO) Take 1 tablet by mouth daily.    . naproxen sodium (ALEVE) 220 MG tablet Take 220 mg by mouth daily as needed (pain).     . ondansetron (ZOFRAN) 8 MG tablet Take 1 tablet (8 mg total) by mouth 2 (two) times daily as needed (Nausea or vomiting). 30  tablet 1  . prochlorperazine (COMPAZINE) 10 MG tablet Take 1 tablet (10 mg total) by mouth every 6 (six) hours as needed (Nausea or vomiting). 30 tablet 1  . traMADol (ULTRAM) 50 MG tablet Take 1-2 tablets (50-100 mg total) by mouth every 6 (six) hours as needed. 60 tablet 0   Current Facility-Administered Medications  Medication Dose Route Frequency Provider Last Rate Last Dose  . influenza vac split quadrivalent PF (FLUARIX) injection 0.5 mL  0.5 mL Intramuscular Once Truitt Merle, MD        PHYSICAL EXAMINATION: ECOG PERFORMANCE STATUS: 1 - Symptomatic but completely ambulatory  Vitals:   03/01/19 1118  BP: 122/81  Pulse: 88  Resp: 18  Temp: 98.5 F (36.9 C)  SpO2: 100%   Filed Weights   03/01/19 1118  Weight: 145 lb 3.2 oz (65.9 kg)    GENERAL:alert, no distress and comfortable SKIN: skin color, texture, turgor are normal, no rashes or significant lesions EYES: normal, Conjunctiva are pink and non-injected, sclera clear OROPHARYNX:no exudate, no erythema and lips, and tongue normal (+) Mild mouth sores NECK: supple, thyroid normal size, non-tender, without nodularity LYMPH:  no palpable lymphadenopathy in the cervical, axillary  LUNGS: clear to auscultation and percussion with normal breathing effort HEART: regular rate & rhythm and no  murmurs and no lower extremity edema ABDOMEN:abdomen soft, non-tender and normal bowel sounds Musculoskeletal:no cyanosis of digits and no clubbing  NEURO: alert & oriented x 3 with fluent speech, no focal motor/sensory deficits  LABORATORY DATA:  I have reviewed the data as listed CBC Latest Ref Rng & Units 03/01/2019 02/22/2019 02/15/2019  WBC 4.0 - 10.5 K/uL 2.3(L) 4.9 5.2  Hemoglobin 12.0 - 15.0 g/dL 8.9(L) 9.5(L) 10.5(L)  Hematocrit 36.0 - 46.0 % 27.9(L) 30.1(L) 32.1(L)  Platelets 150 - 400 K/uL 226 354 330     CMP Latest Ref Rng & Units 03/01/2019 02/22/2019 02/15/2019  Glucose 70 - 99 mg/dL 115(H) 100(H) 156(H)  BUN 6 - 20 mg/dL 9  8 6   Creatinine 0.44 - 1.00 mg/dL 0.72 0.93 0.82  Sodium 135 - 145 mmol/L 142 141 141  Potassium 3.5 - 5.1 mmol/L 3.9 4.3 3.9  Chloride 98 - 111 mmol/L 105 104 105  CO2 22 - 32 mmol/L 27 30 29   Calcium 8.9 - 10.3 mg/dL 9.2 11.7(H) 11.8(H)  Total Protein 6.5 - 8.1 g/dL 7.1 7.4 7.5  Total Bilirubin 0.3 - 1.2 mg/dL 0.4 <0.2(L) 0.3  Alkaline Phos 38 - 126 U/L 69 71 68  AST 15 - 41 U/L 13(L) 11(L) 11(L)  ALT 0 - 44 U/L 10 12 14       RADIOGRAPHIC STUDIES: I have personally reviewed the radiological images as listed and agreed with the findings in the report. No results found.   ASSESSMENT & PLAN:  Leah Bailey is a 63 y.o. female with   1. Squamous cell carcinoma of rectum   -She was diagnosed in 12/2018. Her CT scans shows low rectal wall thickening with abscess and surrounding lymphadenopathy, largest up to 71m. Chest scan also shows 2 tiny 422mright lung nodules which are likely benign. PET shows no distant metastasis.  -We discussed that the treatment of rectal squamous cell carcinoma is similar to anal cancer. I recommended standard therapy with definitive concurrent chemoRT with Mitomycin and 5FU M-F on week 1 and 5 which she started on 02/22/19.  -We previously discussed the rate of cure is high, and the surgery is reserved for residual or recurrent disease after chemoradiation. -She has tolerated week one chemoRT moderately well overall with acid reflux, burning sensation of rectal and vaginal area and mouth sores. She will continue mouthwash and will start Prilosec. -Labs reviewed, CBC and CMP WNL except WBC 2.3, Hg 8.9.  She has developed a mild leukopenia, ANC normal today.  We discussed that she will develop worsening cytopenias later this week, neutropenic fever precaution reviewed with her again. -f/u with NP Lacie next week  -She opted to proceed with flu shot today.    2. Anemia, GI Bleeding  -Per patient she has had chronic mildly low anemia. She has been on oral  ferrous sulfate.  -This has worsened lately secondary to her cancer and GI bleeding -She is symptomatic with SOB and fatigue.  -Hg 8.9 today 03/01/19. Ferritin still high, iron level normal, this is consistent with anemia of chronic disease (colistis and cancer) -Stable very mild GI bleeding   3. Rectal Pain  -Secondary to #1  -Pain not constant but can get up to 10/10. Is exacerbated by eating and BMs.  -On tramadol currently but not controlling her pain. On 02/04/19 I increased to 5090m-2 tabs q6hours.  -I discussed as she starts intensive treatment her rectal pain will worsen. She will likely require stronger pain medication. I discussed longterm narcotics  use can cause addiction so we will monitor her use very closely. Will give her enough to control her pain, then reduce or start to wean her off once pain improves. She understands.  -I discussed pain medication can cause constipation. I encouraged her to keep her stool soft with prophylactic stool softener Ducolax or laxatives.  -With start of ChemoRt she has not true pain, but feels uncomfortable with burning sensation of vaginal area. She still only uses Tramadol for severe pain. BMs regular.    4. Ulcerative Colitis/IBD, Diverticulosis   -Found incidentally on 12/2017 screening colonoscopy  -On Lialda given by Dr Carlean Purl  -Given she is asymptomatic and Lialda can lead to low blood counts, I will speak with Dr. Carlean Purl about stopping this medication.  -She has had some Acid reflux form chemo. I advised her to take Prilosec OTC. I also encouraged her to avoid acid or deep fried in her diet.   5. HTN -On Lisinopril, will continue -will monitor her BP during chemoRT, and adjust her meds as needed   -BP normal today    6. Hypercalcemia -Ca at 11.4 on 02/04/19 -Given prior bone density years ago she was previously on oral calcium which she stopped 1 month ago.  -I encouraged her to drink plenty of water.  -Will check PTH,rpPTH  -If calcium>12, will consider zometa  -Ca at 9.2 today (03/01/19), much improved   7. Mucositis and gastritis  -She will continue mouthwash, she will call us if she needs Magic mouthwash -She will start Prilosec  PLAN:  -flu shot today  -Continue radiation -lab weekly  -F/u next week with NP Laice     No problem-specific Assessment & Plan notes found for this encounter.   No orders of the defined types were placed in this encounter.  All questions were answered. The patient knows to call the clinic with any problems, questions or concerns. No barriers to learning was detected. I spent 15 minutes counseling the patient face to face. The total time spent in the appointment was 20 minutes and more than 50% was on counseling and review of test results     Truitt Merle, MD 03/01/2019   I, Joslyn Devon, am acting as scribe for Truitt Merle, MD.   I have reviewed the above documentation for accuracy and completeness, and I agree with the above.

## 2019-02-26 ENCOUNTER — Other Ambulatory Visit: Payer: Self-pay

## 2019-02-26 ENCOUNTER — Inpatient Hospital Stay: Payer: BC Managed Care – PPO

## 2019-02-26 ENCOUNTER — Ambulatory Visit: Payer: BC Managed Care – PPO

## 2019-02-26 ENCOUNTER — Ambulatory Visit
Admission: RE | Admit: 2019-02-26 | Discharge: 2019-02-26 | Disposition: A | Discharge status: Home / Self Care | Payer: BC Managed Care – PPO | Source: Ambulatory Visit | Attending: Radiation Oncology | Admitting: Radiation Oncology

## 2019-02-26 DIAGNOSIS — C20 Malignant neoplasm of rectum: Secondary | ICD-10-CM | POA: Diagnosis not present

## 2019-02-26 DIAGNOSIS — C210 Malignant neoplasm of anus, unspecified: Secondary | ICD-10-CM

## 2019-02-26 MED ORDER — SODIUM CHLORIDE 0.9% FLUSH
10.0000 mL | INTRAVENOUS | Status: DC | PRN
  Filled 2019-02-26: qty 10

## 2019-02-26 MED ORDER — HEPARIN SOD (PORK) LOCK FLUSH 100 UNIT/ML IV SOLN
500.0000 [IU] | Freq: Once | INTRAVENOUS | Status: DC | PRN
  Filled 2019-02-26: qty 5

## 2019-02-26 NOTE — Progress Notes (Signed)
Pt here for patient teaching.  Pt given Radiation and You booklet.  Reviewed areas of pertinence such as diarrhea, fatigue, hair loss, nausea and vomiting, sexual and fertility changes, skin changes and urinary and bladder changes . Pt able to give teach back of to pat skin, use unscented/gentle soap, use baby wipes, have Imodium on hand, drink plenty of water and sitz bath,avoid applying anything to skin within 4 hours of treatment. Pt verbalizes understanding of information given and will contact nursing with any questions or concerns.     Gloriajean Dell. Leonie Green, BSN

## 2019-03-01 ENCOUNTER — Other Ambulatory Visit: Payer: Self-pay

## 2019-03-01 ENCOUNTER — Ambulatory Visit
Admission: RE | Admit: 2019-03-01 | Discharge: 2019-03-01 | Disposition: A | Discharge status: Home / Self Care | Payer: BC Managed Care – PPO | Source: Ambulatory Visit | Attending: Radiation Oncology | Admitting: Radiation Oncology

## 2019-03-01 ENCOUNTER — Inpatient Hospital Stay: Payer: BC Managed Care – PPO

## 2019-03-01 ENCOUNTER — Encounter: Payer: Self-pay | Admitting: Hematology

## 2019-03-01 ENCOUNTER — Inpatient Hospital Stay: Payer: BC Managed Care – PPO | Admitting: Hematology

## 2019-03-01 VITALS — BP 122/81 | HR 88 | Temp 98.5°F | Resp 18 | Ht 65.0 in | Wt 145.2 lb

## 2019-03-01 DIAGNOSIS — C210 Malignant neoplasm of anus, unspecified: Secondary | ICD-10-CM | POA: Diagnosis not present

## 2019-03-01 DIAGNOSIS — C20 Malignant neoplasm of rectum: Secondary | ICD-10-CM | POA: Diagnosis not present

## 2019-03-01 DIAGNOSIS — Z23 Encounter for immunization: Secondary | ICD-10-CM | POA: Diagnosis not present

## 2019-03-01 DIAGNOSIS — Z5111 Encounter for antineoplastic chemotherapy: Secondary | ICD-10-CM | POA: Diagnosis not present

## 2019-03-01 DIAGNOSIS — D649 Anemia, unspecified: Secondary | ICD-10-CM

## 2019-03-01 LAB — FERRITIN: Ferritin: 578 ng/mL — ABNORMAL HIGH (ref 11–307)

## 2019-03-01 LAB — CMP (CANCER CENTER ONLY)
ALT: 10 U/L (ref 0–44)
AST: 13 U/L — ABNORMAL LOW (ref 15–41)
Albumin: 3.6 g/dL (ref 3.5–5.0)
Alkaline Phosphatase: 69 U/L (ref 38–126)
Anion gap: 10 (ref 5–15)
BUN: 9 mg/dL (ref 6–20)
CO2: 27 mmol/L (ref 22–32)
Calcium: 9.2 mg/dL (ref 8.9–10.3)
Chloride: 105 mmol/L (ref 98–111)
Creatinine: 0.72 mg/dL (ref 0.44–1.00)
GFR, Est AFR Am: >60 mL/min (ref >60)
GFR, Estimated: >60 mL/min (ref >60)
Glucose, Bld: 115 mg/dL — ABNORMAL HIGH (ref 70–99)
Potassium: 3.9 mmol/L (ref 3.5–5.1)
Sodium: 142 mmol/L (ref 135–145)
Total Bilirubin: 0.4 mg/dL (ref 0.3–1.2)
Total Protein: 7.1 g/dL (ref 6.5–8.1)

## 2019-03-01 LAB — CBC WITH DIFFERENTIAL (CANCER CENTER ONLY)
Abs Immature Granulocytes: 0.01 10*3/uL (ref 0.00–0.07)
Basophils Absolute: 0 10*3/uL (ref 0.0–0.1)
Basophils Relative: 1 %
Eosinophils Absolute: 0 10*3/uL (ref 0.0–0.5)
Eosinophils Relative: 2 %
HCT: 27.9 % — ABNORMAL LOW (ref 36.0–46.0)
Hemoglobin: 8.9 g/dL — ABNORMAL LOW (ref 12.0–15.0)
Immature Granulocytes: 0 %
Lymphocytes Relative: 17 %
Lymphs Abs: 0.4 10*3/uL — ABNORMAL LOW (ref 0.7–4.0)
MCH: 27.6 pg (ref 26.0–34.0)
MCHC: 31.9 g/dL (ref 30.0–36.0)
MCV: 86.6 fL (ref 80.0–100.0)
Monocytes Absolute: 0.1 10*3/uL (ref 0.1–1.0)
Monocytes Relative: 4 %
Neutro Abs: 1.8 10*3/uL (ref 1.7–7.7)
Neutrophils Relative %: 76 %
Platelet Count: 226 10*3/uL (ref 150–400)
RBC: 3.22 MIL/uL — ABNORMAL LOW (ref 3.87–5.11)
RDW: 12.7 % (ref 11.5–15.5)
WBC Count: 2.3 10*3/uL — ABNORMAL LOW (ref 4.0–10.5)
nRBC: 0 % (ref 0.0–0.2)

## 2019-03-01 MED ORDER — INFLUENZA VAC SPLIT QUAD 0.5 ML IM SUSY
PREFILLED_SYRINGE | INTRAMUSCULAR | Status: AC
  Filled 2019-03-01: qty 0.5

## 2019-03-01 MED ORDER — INFLUENZA VAC SPLIT QUAD 0.5 ML IM SUSY
0.5000 mL | PREFILLED_SYRINGE | Freq: Once | INTRAMUSCULAR | Status: AC
  Administered 2019-03-01: 12:00:00 0.5 mL via INTRAMUSCULAR

## 2019-03-01 NOTE — Patient Instructions (Signed)
Influenza Virus Vaccine injection What is this medicine? INFLUENZA VIRUS VACCINE (in floo EN zuh VAHY ruhs vak SEEN) helps to reduce the risk of getting influenza also known as the flu. The vaccine only helps protect you against some strains of the flu. This medicine may be used for other purposes; ask your health care provider or pharmacist if you have questions. COMMON BRAND NAME(S): Afluria, Afluria Quadrivalent, Agriflu, Alfuria, FLUAD, Fluarix, Fluarix Quadrivalent, Flublok, Flublok Quadrivalent, FLUCELVAX, Flulaval, Fluvirin, Fluzone, Fluzone High-Dose, Fluzone Intradermal What should I tell my health care provider before I take this medicine? They need to know if you have any of these conditions:  bleeding disorder like hemophilia  fever or infection  Guillain-Barre syndrome or other neurological problems  immune system problems  infection with the human immunodeficiency virus (HIV) or AIDS  low blood platelet counts  multiple sclerosis  an unusual or allergic reaction to influenza virus vaccine, latex, other medicines, foods, dyes, or preservatives. Different brands of vaccines contain different allergens. Some may contain latex or eggs. Talk to your doctor about your allergies to make sure that you get the right vaccine.  pregnant or trying to get pregnant  breast-feeding How should I use this medicine? This vaccine is for injection into a muscle or under the skin. It is given by a health care professional. A copy of Vaccine Information Statements will be given before each vaccination. Read this sheet carefully each time. The sheet may change frequently. Talk to your healthcare provider to see which vaccines are right for you. Some vaccines should not be used in all age groups. Overdosage: If you think you have taken too much of this medicine contact a poison control center or emergency room at once. NOTE: This medicine is only for you. Do not share this medicine with  others. What if I miss a dose? This does not apply. What may interact with this medicine?  chemotherapy or radiation therapy  medicines that lower your immune system like etanercept, anakinra, infliximab, and adalimumab  medicines that treat or prevent blood clots like warfarin  phenytoin  steroid medicines like prednisone or cortisone  theophylline  vaccines This list may not describe all possible interactions. Give your health care provider a list of all the medicines, herbs, non-prescription drugs, or dietary supplements you use. Also tell them if you smoke, drink alcohol, or use illegal drugs. Some items may interact with your medicine. What should I watch for while using this medicine? Report any side effects that do not go away within 3 days to your doctor or health care professional. Call your health care provider if any unusual symptoms occur within 6 weeks of receiving this vaccine. You may still catch the flu, but the illness is not usually as bad. You cannot get the flu from the vaccine. The vaccine will not protect against colds or other illnesses that may cause fever. The vaccine is needed every year. What side effects may I notice from receiving this medicine? Side effects that you should report to your doctor or health care professional as soon as possible:  allergic reactions like skin rash, itching or hives, swelling of the face, lips, or tongue Side effects that usually do not require medical attention (report to your doctor or health care professional if they continue or are bothersome):  fever  headache  muscle aches and pains  pain, tenderness, redness, or swelling at the injection site  tiredness This list may not describe all possible side effects. Call your  doctor for medical advice about side effects. You may report side effects to FDA at 1-800-FDA-1088. Where should I keep my medicine? The vaccine will be given by a health care professional in a  clinic, pharmacy, doctor's office, or other health care setting. You will not be given vaccine doses to store at home. NOTE: This sheet is a summary. It may not cover all possible information. If you have questions about this medicine, talk to your doctor, pharmacist, or health care provider.  2020 Elsevier/Gold Standard (2018-03-31 08:45:43)

## 2019-03-02 ENCOUNTER — Telehealth: Payer: Self-pay

## 2019-03-02 ENCOUNTER — Encounter: Payer: Self-pay | Admitting: Hematology

## 2019-03-02 ENCOUNTER — Other Ambulatory Visit: Payer: Self-pay

## 2019-03-02 ENCOUNTER — Telehealth: Payer: Self-pay | Admitting: Hematology

## 2019-03-02 ENCOUNTER — Ambulatory Visit
Admission: RE | Admit: 2019-03-02 | Discharge: 2019-03-02 | Disposition: A | Discharge status: Home / Self Care | Payer: BC Managed Care – PPO | Source: Ambulatory Visit | Attending: Radiation Oncology | Admitting: Radiation Oncology

## 2019-03-02 DIAGNOSIS — C20 Malignant neoplasm of rectum: Secondary | ICD-10-CM | POA: Diagnosis not present

## 2019-03-02 NOTE — Telephone Encounter (Signed)
She can try benadryl PRN or tylenol PM.  Thanks, Regan Rakers

## 2019-03-02 NOTE — Telephone Encounter (Signed)
No los per 10/12.

## 2019-03-02 NOTE — Telephone Encounter (Signed)
Reached out to patient to offer support and to assess for navigation needs. Unable to reach patient, left VM.  Husband called me back and shared that patient is having a hard time sleeping at night because "her mind doesn't want to turn off." He is requesting something to help pt sleep at night. They have not tried any OTC medications. Will communicate this to [facility] NP and Dr. Ernestina Penna nurse.

## 2019-03-03 ENCOUNTER — Ambulatory Visit
Admission: RE | Admit: 2019-03-03 | Discharge: 2019-03-03 | Disposition: A | Discharge status: Home / Self Care | Payer: BC Managed Care – PPO | Source: Ambulatory Visit | Attending: Radiation Oncology | Admitting: Radiation Oncology

## 2019-03-03 ENCOUNTER — Other Ambulatory Visit: Payer: Self-pay

## 2019-03-03 ENCOUNTER — Telehealth: Payer: Self-pay

## 2019-03-03 DIAGNOSIS — C20 Malignant neoplasm of rectum: Secondary | ICD-10-CM | POA: Diagnosis not present

## 2019-03-03 NOTE — Telephone Encounter (Signed)
Called patient back to suggest Benadryl PRN  or Tylenol PM for  Difficulty sleeping. Reminded to not exceed 2,000 mg of Tylenol per day. Appreciative for call back.

## 2019-03-04 ENCOUNTER — Other Ambulatory Visit: Payer: Self-pay

## 2019-03-04 ENCOUNTER — Ambulatory Visit
Admission: RE | Admit: 2019-03-04 | Discharge: 2019-03-04 | Disposition: A | Discharge status: Home / Self Care | Payer: BC Managed Care – PPO | Source: Ambulatory Visit | Attending: Radiation Oncology | Admitting: Radiation Oncology

## 2019-03-04 DIAGNOSIS — C20 Malignant neoplasm of rectum: Secondary | ICD-10-CM | POA: Diagnosis not present

## 2019-03-04 LAB — PTH-RELATED PEPTIDE: PTH-related peptide: <2 pmol/L

## 2019-03-05 ENCOUNTER — Other Ambulatory Visit: Payer: Self-pay

## 2019-03-05 ENCOUNTER — Ambulatory Visit
Admission: RE | Admit: 2019-03-05 | Discharge: 2019-03-05 | Disposition: A | Discharge status: Home / Self Care | Payer: BC Managed Care – PPO | Source: Ambulatory Visit | Attending: Radiation Oncology | Admitting: Radiation Oncology

## 2019-03-05 DIAGNOSIS — C20 Malignant neoplasm of rectum: Secondary | ICD-10-CM | POA: Diagnosis not present

## 2019-03-07 NOTE — Progress Notes (Signed)
Leah Bailey   [phone] [phone]   Clinic Follow up Note   Patient Care Team: Harrison Mons, PA as PCP - General (Family Medicine) Jovita Kussmaul, MD as Consulting Physician (General Surgery) Ileana Roup, MD as Consulting Physician (General Surgery) Kyung Rudd, MD as Consulting Physician (Radiation Oncology) Truitt Merle, MD as Consulting Physician (Hematology) Arna Snipe, RN as Oncology Nurse Navigator 03/08/2019  CHIEF COMPLAINT: F/u rectal cancer   SUMMARY OF ONCOLOGIC HISTORY: Oncology History Overview Note  Cancer Staging Squamous cell carcinoma of rectum [facility]) Staging form: Colon and Rectum, AJCC 8th Edition - Clinical stage from 02/04/2019: Stage IIIA (cT2, cN1a, cM0) - Signed by Truitt Merle, MD on 02/04/2019     Malignant neoplasm of anus ([place])  01/13/2019 Initial Diagnosis   Squamous cell carcinoma of rectum ([place])   01/14/2019 Imaging   CT Pelvis W Contrast 01/14/19 IMPRESSION: Perirectal abscess, at the left posterior aspect of the rectum, inseparable from the wall of the colon and the adjacent pelvic musculature. Largest diameter on cranial caudal imaging measures 4.8 cm.   Asymmetric thickening of the rectum associated with the abscess. A perforated rectal carcinoma is favored given the appearance, and correlation with physical exam as well anoscopy/rigid sigmoidoscopy. Alternatively, the changes may reflect chronic inflammation in the setting of inflammatory bowel disease.   There are several small lymph nodes within the perirectal fat and along the left pelvis, the largest measuring 13 mm, potentially pathologic in the setting of carcinoma, or alternatively reactive.   01/15/2019 Initial Diagnosis   Diagnosis 01/15/19 Soft tissue, abscess, rectal wall - INVASIVE SQUAMOUS CELL CARCINOMA, MODERATELY TO POORLY DIFFERENTIATED.   02/03/2019 Imaging   CT Chest W Contrast 02/03/19  IMPRESSION: 1. Two nodules each  measuring about 4 mm in long axis noted in the right upper lobe. One of these is subpleural. These are likely benign but given the context, surveillance imaging in 6-12 months time should be considered. Fleischner criteria for follow do not directly apply given the history of malignancy.   02/04/2019 Cancer Staging   Staging form: Colon and Rectum, AJCC 8th Edition - Clinical stage from 02/04/2019: Stage IIIA (cT2, cN1a, cM0) - Signed by Truitt Merle, MD on 02/04/2019   02/15/2019 PET scan   IMPRESSION: 1. Hypermetabolic eccentric rectal wall thickening with hypermetabolic left perirectal adenopathy. No evidence of distant metastatic disease. 2. Possible gallbladder sludge. 3. Left renal stone.   02/22/2019 -  Chemotherapy   ChemoRT with Mitomycin/5FU week 1 and 5 starting 02/22/19   02/22/2019 -  Radiation Therapy   ChemoRT with Dr. Sondra Come starting 02/22/19     CURRENT THERAPY: ChemoRT with Mitomycin/5FU week 1 and 5 starting10/5/20  INTERVAL HISTORY: Leah Bailey returns for f/u as scheduled as she begins week 3 of treatment. She has perirectal burning and itching. She can't sleep due to itching. She tried topical hydrocortisone which made it worse. She can't sleep. Burning/pain is not bad. Has mild dysuria that is better with oral cranberry tabs. Takes Tramadol PRN. Has 6 small diarrhea episodes per day, more formed after meals. Denies rectal bleeding or discharge. Does not take imodium because she was prone to constipation. Denies n/v. Reflux improved on prilosec. She is very fatigued but able to do her normal activities, with more effort. She is eating and drinking well. Denies mucositis. Denies recent fever, chills, cough, chest pain. Has mild DOE that resolves with rest. Her PICC line is still in, has not been changed or flushed since  pump d/c on 10/9.    MEDICAL HISTORY:  Past Medical History:  Diagnosis Date  . Herpes simplex disciform keratitis 10/20/2013  . Hypertension   . IBD  (inflammatory bowel disease) - colitis 01/11/2018  . Nuclear sclerosis of both eyes 11/30/2014  . Squamous cell carcinoma of rectum ([place]) 01/13/2019  . UC (ulcerative colitis) ([place])     SURGICAL HISTORY: Past Surgical History:  Procedure Laterality Date  . ABDOMINAL HYSTERECTOMY    . BREAST CYST ASPIRATION Left 1983  . COLONOSCOPY    . INCISION AND DRAINAGE PERIRECTAL ABSCESS N/A 01/15/2019   Procedure: INTERNAL DRAINAGE OF PERIRECTAL ABSCESS;  Surgeon: Jovita Kussmaul, MD;  Location: [facility];  Service: General;  Laterality: N/A;  . TUBAL LIGATION      I have reviewed the social history and family history with the patient and they are unchanged from previous note.  ALLERGIES:  has No Known Allergies.  MEDICATIONS:  Current Outpatient Medications  Medication Sig Dispense Refill  . aspirin 81 MG tablet Take 81 mg by mouth daily.    . ferrous sulfate 325 (65 FE) MG EC tablet Take 650 mg by mouth daily with breakfast.     . lisinopril-hydrochlorothiazide (PRINZIDE,ZESTORETIC) 20-25 MG tablet Take 1 tablet by mouth daily. 90 tablet 3  . mesalamine (LIALDA) 1.2 g EC tablet TAKE 2 TABLETS (2.4 G TOTAL) BY MOUTH DAILY WITH BREAKFAST. 180 tablet 3  . Multiple Vitamins-Calcium (ONE-A-DAY WOMENS PO) Take 1 tablet by mouth daily.    . naproxen sodium (ALEVE) 220 MG tablet Take 220 mg by mouth daily as needed (pain).     . ondansetron (ZOFRAN) 8 MG tablet Take 1 tablet (8 mg total) by mouth 2 (two) times daily as needed (Nausea or vomiting). 30 tablet 1  . prochlorperazine (COMPAZINE) 10 MG tablet Take 1 tablet (10 mg total) by mouth every 6 (six) hours as needed (Nausea or vomiting). 30 tablet 1  . traMADol (ULTRAM) 50 MG tablet Take 1-2 tablets (50-100 mg total) by mouth every 6 (six) hours as needed. 60 tablet 0   No current facility-administered medications for this visit.     PHYSICAL EXAMINATION: ECOG PERFORMANCE STATUS: 1 - Symptomatic but completely ambulatory  Vitals:   03/08/19 1118  03/08/19 1245  BP: 105/82 100/70  Pulse: 91 76  Resp: 18 18  Temp: 98.9 F (37.2 C) 99.2 F (37.3 C)  SpO2: 100% 100%   Filed Weights   03/08/19 1118  Weight: 142 lb 4.8 oz (64.5 kg)    GENERAL:alert, no distress and comfortable SKIN: no rash  EYES: sclera clear LUNGS: clear with normal breathing effort HEART: regular rate & rhythm, no lower extremity edema ABDOMEN: abdomen soft, non-tender and normal bowel sounds Musculoskeletal:no cyanosis of digits  NEURO: alert & oriented x 3 with fluent speech, normal gait RECTAL: external exam reveals no skin breakdown PICC in place, completely covered with occlusive dressing    LABORATORY DATA:  I have reviewed the data as listed CBC Latest Ref Rng & Units 03/08/2019 03/01/2019 02/22/2019  WBC 4.0 - 10.5 K/uL 1.9(L) 2.3(L) 4.9  Hemoglobin 12.0 - 15.0 g/dL 8.4(L) 8.9(L) 9.5(L)  Hematocrit 36.0 - 46.0 % 26.0(L) 27.9(L) 30.1(L)  Platelets 150 - 400 K/uL 99(L) 226 354     CMP Latest Ref Rng & Units 03/08/2019 03/01/2019 02/22/2019  Glucose 70 - 99 mg/dL 114(H) 115(H) 100(H)  BUN 6 - 20 mg/dL 8 9 8   Creatinine 0.44 - 1.00 mg/dL 0.72 0.72 0.93  Sodium  135 - 145 mmol/L 140 142 141  Potassium 3.5 - 5.1 mmol/L 3.9 3.9 4.3  Chloride 98 - 111 mmol/L 107 105 104  CO2 22 - 32 mmol/L 23 27 30   Calcium 8.9 - 10.3 mg/dL 9.2 9.2 11.7(H)  Total Protein 6.5 - 8.1 g/dL 6.9 7.1 7.4  Total Bilirubin 0.3 - 1.2 mg/dL 0.2(L) 0.4 <0.2(L)  Alkaline Phos 38 - 126 U/L 71 69 71  AST 15 - 41 U/L 12(L) 13(L) 11(L)  ALT 0 - 44 U/L 13 10 12       RADIOGRAPHIC STUDIES: I have personally reviewed the radiological images as listed and agreed with the findings in the report. No results found.   ASSESSMENT & PLAN: Leah Bodifordis a 63 y.o.African Americanfemalewith a history of HTN, IBD, ulcerative colitis  1.Squamous cell carcinoma of rectum -diagnosed 12/2018. Her CT scans shows low rectal wall thickeningwith abcessand surrounding  lymphadenopathy, largest up to 44m. Chest scan also shows 2 tiny 448mright lung nodules which are likely benign.  -PET scan showed hypermetabolic rectal wall thickening with hypermetabolic left perirectal adenopathy, consistent with locally advanced disease. No distant metastasis.  -She began concurrent chemoRT with 5FU/mitomycin and radiation on 10/5  2. Anemia, GI Bleeding -Per patient she has had chronic mildly low anemia. She has been on oral ferrous sulfate.  -This has worsened lately secondary to her cancer and GI bleeding -she is tolerating oral iron every other day -She has worsening anemia lately secondary to chemo, Hgb 8.4 today. She has increased fatigue which is likely related to anemia and chemoRT  3. Rectal Pain, perirectal burning and itching -Secondary to #1  -takes Tramadol PRN -her primary complaint is perirectal itching lately, secondary to radiation -hydrocortisone did not help; I discussed with PA AlShona Simpsonho recommends dermaplast spray -she can try oral benadryl PRN at night to help her sleep   4. Ulcerative Colitis/IBD, Diverticulosis  -Found incidentally on 12/2017 screening colonoscopy  -On Lialda given by Dr GeCarlean Purlhich he recommends to continue as it primarily acts locally at the mucosal level in the colon, no significant systemic absorption.  5. HTN -On Lisinopril, will continue -she takes BP med every other day, BP 105/82 today  6. Hypercalcemia -Given prior bone density years ago she waspreviouslyon oral calcium which she stoppedrecently but she does take women's one a day vitamin with calcium which is on hold this for now -PTH related peptide is normal, her hypercalcemia couldbe secondary to her cancer.  -PTH intact and calcium is pending -Ifcalcium>12, will consider zometa -Ca normalized recently, 9.2 today  7. Nausea, GERD, mucositis -secondary to chemotherapy -improved with symptom management medication  Disposition:  Ms.  BoOlivarppears well. She is tolerating treatment with fatigue and perirectal itching. Reflux much improved; nausea and mucositis resolved. Labs reviewed. CMP is stable. CBC with pancytopenia, ANC 1.3, PLT 99K, Hgb 8.4. We reviewed infection precautions. Will repeat labs on 10/22. Her fatigue is likely related to worsening anemia and chemoRT. Will monitor closely.  For perirectal itching, she can try dermaplast spray and sitz bath with water. Unfortunately, more topical remedies may worsen skin breakdown. She can try oral benadryl PRN at night to help her sleep. I discussed with radiation PA AlShona Simpson  She will have her PICC line removed today, we discussed this is an additional source of infection. She will have it placed before week 5 treatment in 2 weeks. She will return for lab on 10/22   All questions were answered. The  patient knows to call the clinic with any problems, questions or concerns. No barriers to learning was detected.     Alla Feeling, NP 03/08/19

## 2019-03-08 ENCOUNTER — Ambulatory Visit
Admission: RE | Admit: 2019-03-08 | Discharge: 2019-03-08 | Disposition: A | Discharge status: Home / Self Care | Payer: BC Managed Care – PPO | Source: Ambulatory Visit | Attending: Radiation Oncology | Admitting: Radiation Oncology

## 2019-03-08 ENCOUNTER — Inpatient Hospital Stay: Payer: BC Managed Care – PPO

## 2019-03-08 ENCOUNTER — Other Ambulatory Visit: Payer: Self-pay

## 2019-03-08 ENCOUNTER — Encounter: Payer: Self-pay | Admitting: Nurse Practitioner

## 2019-03-08 ENCOUNTER — Inpatient Hospital Stay: Payer: BC Managed Care – PPO | Admitting: Nurse Practitioner

## 2019-03-08 VITALS — BP 100/70 | HR 76 | Temp 99.2°F | Resp 18 | Ht 65.0 in | Wt 142.3 lb

## 2019-03-08 DIAGNOSIS — C20 Malignant neoplasm of rectum: Secondary | ICD-10-CM | POA: Diagnosis not present

## 2019-03-08 DIAGNOSIS — Z23 Encounter for immunization: Secondary | ICD-10-CM

## 2019-03-08 DIAGNOSIS — Z5111 Encounter for antineoplastic chemotherapy: Secondary | ICD-10-CM | POA: Diagnosis not present

## 2019-03-08 LAB — CMP (CANCER CENTER ONLY)
ALT: 13 U/L (ref 0–44)
AST: 12 U/L — ABNORMAL LOW (ref 15–41)
Albumin: 3.5 g/dL (ref 3.5–5.0)
Alkaline Phosphatase: 71 U/L (ref 38–126)
Anion gap: 10 (ref 5–15)
BUN: 8 mg/dL (ref 6–20)
CO2: 23 mmol/L (ref 22–32)
Calcium: 9.2 mg/dL (ref 8.9–10.3)
Chloride: 107 mmol/L (ref 98–111)
Creatinine: 0.72 mg/dL (ref 0.44–1.00)
GFR, Est AFR Am: >60 mL/min (ref >60)
GFR, Estimated: >60 mL/min (ref >60)
Glucose, Bld: 114 mg/dL — ABNORMAL HIGH (ref 70–99)
Potassium: 3.9 mmol/L (ref 3.5–5.1)
Sodium: 140 mmol/L (ref 135–145)
Total Bilirubin: 0.2 mg/dL — ABNORMAL LOW (ref 0.3–1.2)
Total Protein: 6.9 g/dL (ref 6.5–8.1)

## 2019-03-08 LAB — CBC WITH DIFFERENTIAL (CANCER CENTER ONLY)
Abs Immature Granulocytes: 0.02 10*3/uL (ref 0.00–0.07)
Basophils Absolute: 0 10*3/uL (ref 0.0–0.1)
Basophils Relative: 0 %
Eosinophils Absolute: 0.1 10*3/uL (ref 0.0–0.5)
Eosinophils Relative: 6 %
HCT: 26 % — ABNORMAL LOW (ref 36.0–46.0)
Hemoglobin: 8.4 g/dL — ABNORMAL LOW (ref 12.0–15.0)
Immature Granulocytes: 1 %
Lymphocytes Relative: 14 %
Lymphs Abs: 0.3 10*3/uL — ABNORMAL LOW (ref 0.7–4.0)
MCH: 27.7 pg (ref 26.0–34.0)
MCHC: 32.3 g/dL (ref 30.0–36.0)
MCV: 85.8 fL (ref 80.0–100.0)
Monocytes Absolute: 0.3 10*3/uL (ref 0.1–1.0)
Monocytes Relative: 14 %
Neutro Abs: 1.3 10*3/uL — ABNORMAL LOW (ref 1.7–7.7)
Neutrophils Relative %: 65 %
Platelet Count: 99 10*3/uL — ABNORMAL LOW (ref 150–400)
RBC: 3.03 MIL/uL — ABNORMAL LOW (ref 3.87–5.11)
RDW: 12.8 % (ref 11.5–15.5)
WBC Count: 1.9 10*3/uL — ABNORMAL LOW (ref 4.0–10.5)
nRBC: 0 % (ref 0.0–0.2)

## 2019-03-08 NOTE — Patient Instructions (Addendum)
For skin itching in private area - use sitz bath with just water .  Try DERMAPLAST in the blue can ,over the counter .  For sleep -You can try  Benadryl 25 mg  to help you sleep.     PICC Removal, Adult, Care After This sheet gives you information about how to care for yourself after your procedure. Your health care provider may also give you more specific instructions. If you have problems or questions, contact your health care provider. What can I expect after the procedure? After your procedure, it is common to have:  Tenderness or soreness.  Redness, swelling, or a scab where the PICC was removed (exit site). Follow these instructions at home: For the first 24 hours after the procedure   Keep the bandage (dressing) on the exit site clean and dry. Do not remove the dressing until your health care provider tells you to do so.  Check your arm often for signs and symptoms of an infection. Check for: ? A red streak that spreads away from the dressing. ? Blood or fluid that you can see on the dressing. ? More redness or swelling.  Do not lift anything heavy or do activities that require great effort until your health care provider says it is okay. You should avoid: ? Lifting weights. ? Yard work. ? Any physical activity with repetitive arm movement.  Watch closely for any signs of an air bubble in the vein (air embolism). This is a rare but serious complication. If you have signs of air embolism, call 911 immediately and lie down on your left side to keep the air from moving into the lungs. Signs of an air embolism include: ? Difficulty breathing. ? Chest pain. ? Coughing or wheezing. ? Skin that is pale, blue, cold, or clammy. ? Rapid pulse. ? Rapid breathing. ? Fainting. After 24 Hours have passed:  Remove your dressing as told by your health care provider. Make sure you wash your hands with soap and water before and after you change the dressing. If soap and water are not  available, use hand sanitizer.  Return to your normal activities as told by your health care provider.  A small scab may develop over the exit site. Do not pick at the scab.  When bathing or showering, gently wash the exit site with soap and water. Pat it dry.  Watch for signs of infection, such as: ? Fever or chills. ? Swollen glands under the arm. ? More redness, swelling, or soreness in the arm. ? Blood, fluid, or pus coming from the exit site. ? Warmth or a bad smell at the exit site. ? A red streak spreading away from the exit site. General instructions  Take over-the-counter and prescription medicines only as told by your health care provider. Do not take any new medicines without checking with your health care provider first.  If you were prescribed an antibiotic medicine, apply or take it as told by your health care provider. Do not stop using the antibiotic even if your condition improves.  Keep all follow-up visits as told by your health care provider. This is important. Contact a health care provider if:  You have a fever or chills.  You have soreness, redness, or swelling on your exit site, and it gets worse.  You have swollen glands under your arm.  You have any of the following symptoms at your exit site: ? Blood, fluid, or pus. ? Unusual warmth. ? A bad smell. ?  A red streak spreading away from the exit site. Get help right away if:  You have numbness or tingling in your fingers, hand, or arm.  Your arm looks blue and feels cold.  You have signs of an air embolism, such as: ? Difficulty breathing. ? Chest pain. ? Coughing or wheezing. ? Skin that is pale, blue, cold, or clammy. ? Rapid pulse. ? Rapid breathing. ? Fainting. These symptoms may represent a serious problem that is an emergency. Do not wait to see if the symptoms will go away. Get medical help right away. Call your local emergency services (911 in the [place] Do not drive yourself to the  hospital. Summary  After your procedure, it is common to have tenderness or soreness, redness, swelling, or a scab at the exit site.  Keep the dressing over the exit site clean and dry. Do not remove the dressing until your health care provider tells you to do so.  Do not lift anything heavy or do activities that require great effort until your health care provider says it is okay.  Watch closely for any signs of an air embolism. If you have signs of air embolism, call 911 immediately and lie down on your left side. This information is not intended to replace advice given to you by your health care provider. Make sure you discuss any questions you have with your health care provider. Document Released: 05/11/2013 Document Revised: 04/18/2017 Document Reviewed: 07/02/2016 Elsevier Patient Education  2020 [employer].

## 2019-03-09 ENCOUNTER — Telehealth: Payer: Self-pay | Admitting: Nurse Practitioner

## 2019-03-09 ENCOUNTER — Ambulatory Visit
Admission: RE | Admit: 2019-03-09 | Discharge: 2019-03-09 | Disposition: A | Discharge status: Home / Self Care | Payer: BC Managed Care – PPO | Source: Ambulatory Visit | Attending: Radiation Oncology | Admitting: Radiation Oncology

## 2019-03-09 ENCOUNTER — Other Ambulatory Visit: Payer: Self-pay

## 2019-03-09 DIAGNOSIS — C20 Malignant neoplasm of rectum: Secondary | ICD-10-CM | POA: Diagnosis not present

## 2019-03-09 LAB — PTH, INTACT AND CALCIUM
Calcium, Total (PTH): 9.2 mg/dL (ref 8.7–10.3)
PTH: 20 pg/mL (ref 15–65)

## 2019-03-09 NOTE — Telephone Encounter (Signed)
Scheduled appt per 10/19 los.  Spoke with pt and she is aware of her appt date and time

## 2019-03-10 ENCOUNTER — Ambulatory Visit
Admission: RE | Admit: 2019-03-10 | Discharge: 2019-03-10 | Disposition: A | Discharge status: Home / Self Care | Payer: BC Managed Care – PPO | Source: Ambulatory Visit | Attending: Radiation Oncology | Admitting: Radiation Oncology

## 2019-03-10 ENCOUNTER — Other Ambulatory Visit: Payer: Self-pay

## 2019-03-10 DIAGNOSIS — C20 Malignant neoplasm of rectum: Secondary | ICD-10-CM | POA: Diagnosis not present

## 2019-03-11 ENCOUNTER — Other Ambulatory Visit: Payer: Self-pay

## 2019-03-11 ENCOUNTER — Other Ambulatory Visit: Payer: Self-pay | Admitting: Hematology

## 2019-03-11 ENCOUNTER — Inpatient Hospital Stay: Payer: BC Managed Care – PPO

## 2019-03-11 ENCOUNTER — Ambulatory Visit
Admission: RE | Admit: 2019-03-11 | Discharge: 2019-03-11 | Disposition: A | Discharge status: Home / Self Care | Payer: BC Managed Care – PPO | Source: Ambulatory Visit | Attending: Radiation Oncology | Admitting: Radiation Oncology

## 2019-03-11 ENCOUNTER — Telehealth: Payer: Self-pay

## 2019-03-11 DIAGNOSIS — C20 Malignant neoplasm of rectum: Secondary | ICD-10-CM | POA: Diagnosis not present

## 2019-03-11 DIAGNOSIS — Z5111 Encounter for antineoplastic chemotherapy: Secondary | ICD-10-CM | POA: Diagnosis not present

## 2019-03-11 LAB — CBC WITH DIFFERENTIAL (CANCER CENTER ONLY)
Abs Immature Granulocytes: 0.01 10*3/uL (ref 0.00–0.07)
Basophils Absolute: 0 10*3/uL (ref 0.0–0.1)
Basophils Relative: 1 %
Eosinophils Absolute: 0.1 10*3/uL (ref 0.0–0.5)
Eosinophils Relative: 5 %
HCT: 25.4 % — ABNORMAL LOW (ref 36.0–46.0)
Hemoglobin: 8.5 g/dL — ABNORMAL LOW (ref 12.0–15.0)
Immature Granulocytes: 1 %
Lymphocytes Relative: 10 %
Lymphs Abs: 0.2 10*3/uL — ABNORMAL LOW (ref 0.7–4.0)
MCH: 28.7 pg (ref 26.0–34.0)
MCHC: 33.5 g/dL (ref 30.0–36.0)
MCV: 85.8 fL (ref 80.0–100.0)
Monocytes Absolute: 0.3 10*3/uL (ref 0.1–1.0)
Monocytes Relative: 16 %
Neutro Abs: 1.3 10*3/uL — ABNORMAL LOW (ref 1.7–7.7)
Neutrophils Relative %: 67 %
Platelet Count: 148 10*3/uL — ABNORMAL LOW (ref 150–400)
RBC: 2.96 MIL/uL — ABNORMAL LOW (ref 3.87–5.11)
RDW: 13.3 % (ref 11.5–15.5)
WBC Count: 1.9 10*3/uL — ABNORMAL LOW (ref 4.0–10.5)
nRBC: 0 % (ref 0.0–0.2)

## 2019-03-11 LAB — CMP (CANCER CENTER ONLY)
ALT: 15 U/L (ref 0–44)
AST: 12 U/L — ABNORMAL LOW (ref 15–41)
Albumin: 3.4 g/dL — ABNORMAL LOW (ref 3.5–5.0)
Alkaline Phosphatase: 73 U/L (ref 38–126)
Anion gap: 8 (ref 5–15)
BUN: 6 mg/dL (ref 6–20)
CO2: 24 mmol/L (ref 22–32)
Calcium: 8.7 mg/dL — ABNORMAL LOW (ref 8.9–10.3)
Chloride: 107 mmol/L (ref 98–111)
Creatinine: 0.68 mg/dL (ref 0.44–1.00)
GFR, Est AFR Am: >60 mL/min (ref >60)
GFR, Estimated: >60 mL/min (ref >60)
Glucose, Bld: 101 mg/dL — ABNORMAL HIGH (ref 70–99)
Potassium: 3.6 mmol/L (ref 3.5–5.1)
Sodium: 139 mmol/L (ref 135–145)
Total Bilirubin: <0.2 mg/dL — ABNORMAL LOW (ref 0.3–1.2)
Total Protein: 6.7 g/dL (ref 6.5–8.1)

## 2019-03-11 NOTE — Telephone Encounter (Signed)
Spoke with patient regarding her lab results from today.  Per Cira Rue NP CBC results are stable, platelets are improving, ANC is still low, instructed her to continue infection precautions for neutropenia.  Instructed her to call if she develops signs of infection including fever and/or chills.  She verbalized an understanding.

## 2019-03-11 NOTE — Telephone Encounter (Signed)
-----   Message from Alla Feeling, NP sent at 03/11/2019 12:32 PM EDT ----- Doristine Devoid! Glad she's not dropping.   Malachy Mood, can you please let her know CBC results. Plt improving, Hgb stable. Continue infection precautions for neutropenia.   Thanks! LB ----- Message ----- From: Hayden Pedro, PA-C Sent: 03/11/2019  11:34 AM EDT To: Alla Feeling, NP  Looked like another 1300 kind of day :) ----- Message ----- From: Alla Feeling, NP Sent: 03/08/2019   5:06 PM EDT To: Kyung Rudd, MD, Hayden Pedro, PA-C  She developed neutropenia, ANC 1.3 today. I am repeating on 10/22 FYI.  Thanks, Regan Rakers

## 2019-03-12 ENCOUNTER — Ambulatory Visit
Admission: RE | Admit: 2019-03-12 | Discharge: 2019-03-12 | Disposition: A | Discharge status: Home / Self Care | Payer: BC Managed Care – PPO | Source: Ambulatory Visit | Attending: Radiation Oncology | Admitting: Radiation Oncology

## 2019-03-12 ENCOUNTER — Other Ambulatory Visit: Payer: Self-pay

## 2019-03-12 DIAGNOSIS — C20 Malignant neoplasm of rectum: Secondary | ICD-10-CM | POA: Diagnosis not present

## 2019-03-14 NOTE — Progress Notes (Signed)
[place]   [phone] [phone]   Clinic Follow up Note   Patient Care Team: Harrison Mons, PA as PCP - General (Family Medicine) Jovita Kussmaul, MD as Consulting Physician (General Surgery) Ileana Roup, MD as Consulting Physician (General Surgery) Kyung Rudd, MD as Consulting Physician (Radiation Oncology) Truitt Merle, MD as Consulting Physician (Hematology) Arna Snipe, RN as Oncology Nurse Navigator 03/15/2019  CHIEF COMPLAINT: F/u squamous cell carcinoma of rectum   SUMMARY OF ONCOLOGIC HISTORY: Oncology History Overview Note  Cancer Staging Squamous cell carcinoma of rectum [facility]) Staging form: Colon and Rectum, AJCC 8th Edition - Clinical stage from 02/04/2019: Stage IIIA (cT2, cN1a, cM0) - Signed by Truitt Merle, MD on 02/04/2019     Malignant neoplasm of anus ([place])  01/13/2019 Initial Diagnosis   Squamous cell carcinoma of rectum ([place])   01/14/2019 Imaging   CT Pelvis W Contrast 01/14/19 IMPRESSION: Perirectal abscess, at the left posterior aspect of the rectum, inseparable from the wall of the colon and the adjacent pelvic musculature. Largest diameter on cranial caudal imaging measures 4.8 cm.   Asymmetric thickening of the rectum associated with the abscess. A perforated rectal carcinoma is favored given the appearance, and correlation with physical exam as well anoscopy/rigid sigmoidoscopy. Alternatively, the changes may reflect chronic inflammation in the setting of inflammatory bowel disease.   There are several small lymph nodes within the perirectal fat and along the left pelvis, the largest measuring 13 mm, potentially pathologic in the setting of carcinoma, or alternatively reactive.   01/15/2019 Initial Diagnosis   Diagnosis 01/15/19 Soft tissue, abscess, rectal wall - INVASIVE SQUAMOUS CELL CARCINOMA, MODERATELY TO POORLY DIFFERENTIATED.   02/03/2019 Imaging   CT Chest W Contrast 02/03/19  IMPRESSION: 1.  Two nodules each measuring about 4 mm in long axis noted in the right upper lobe. One of these is subpleural. These are likely benign but given the context, surveillance imaging in 6-12 months time should be considered. Fleischner criteria for follow do not directly apply given the history of malignancy.   02/04/2019 Cancer Staging   Staging form: Colon and Rectum, AJCC 8th Edition - Clinical stage from 02/04/2019: Stage IIIA (cT2, cN1a, cM0) - Signed by Truitt Merle, MD on 02/04/2019   02/15/2019 PET scan   IMPRESSION: 1. Hypermetabolic eccentric rectal wall thickening with hypermetabolic left perirectal adenopathy. No evidence of distant metastatic disease. 2. Possible gallbladder sludge. 3. Left renal stone.   02/22/2019 -  Chemotherapy   ChemoRT with Mitomycin/5FU week 1 and 5 starting 02/22/19   02/22/2019 -  Radiation Therapy   ChemoRT with Dr. Sondra Come starting 02/22/19     CURRENT THERAPY: ChemoRT with 5FU and mitomycin starting 02/22/19   INTERVAL HISTORY: Ms. Mcglade returns for f/u as scheduled. She continues radiation. She feels well but has more fatigue. Remains able to be out of bed and active with light housework at home. Her appetite is good, she thinks she's eating a lot. Denies mucositis. No fever, chills, cough, chest pain. She has mild DOE. Denies n/v/c. She continues to have small BMs with 8-12 episodes of diarrhea per day. This is usual for her since starting treatment. She tried to modify with her diet, has not taken imodium for fear of constipation which she had before starting chemoRT. Denies rectal bleeding. She does have some itching and soreness, uses benadryl cream and 2 tramadol at night to help her sleep. This is improving overall. Denies new pain. Denies neuropathy.    MEDICAL  HISTORY:  Past Medical History:  Diagnosis Date  . Herpes simplex disciform keratitis 10/20/2013  . Hypertension   . IBD (inflammatory bowel disease) - colitis 01/11/2018  . Nuclear sclerosis  of both eyes 11/30/2014  . Squamous cell carcinoma of rectum ([place]) 01/13/2019  . UC (ulcerative colitis) ([place])     SURGICAL HISTORY: Past Surgical History:  Procedure Laterality Date  . ABDOMINAL HYSTERECTOMY    . BREAST CYST ASPIRATION Left 1983  . COLONOSCOPY    . INCISION AND DRAINAGE PERIRECTAL ABSCESS N/A 01/15/2019   Procedure: INTERNAL DRAINAGE OF PERIRECTAL ABSCESS;  Surgeon: Jovita Kussmaul, MD;  Location: [facility];  Service: General;  Laterality: N/A;  . TUBAL LIGATION      I have reviewed the social history and family history with the patient and they are unchanged from previous note.  ALLERGIES:  has No Known Allergies.  MEDICATIONS:  Current Outpatient Medications  Medication Sig Dispense Refill  . aspirin 81 MG tablet Take 81 mg by mouth daily.    . ferrous sulfate 325 (65 FE) MG EC tablet Take 650 mg by mouth daily with breakfast.     . lisinopril-hydrochlorothiazide (PRINZIDE,ZESTORETIC) 20-25 MG tablet Take 1 tablet by mouth daily. 90 tablet 3  . mesalamine (LIALDA) 1.2 g EC tablet TAKE 2 TABLETS (2.4 G TOTAL) BY MOUTH DAILY WITH BREAKFAST. 180 tablet 3  . Multiple Vitamins-Calcium (ONE-A-DAY WOMENS PO) Take 1 tablet by mouth daily.    . naproxen sodium (ALEVE) 220 MG tablet Take 220 mg by mouth daily as needed (pain).     . ondansetron (ZOFRAN) 8 MG tablet Take 1 tablet (8 mg total) by mouth 2 (two) times daily as needed (Nausea or vomiting). 30 tablet 1  . prochlorperazine (COMPAZINE) 10 MG tablet Take 1 tablet (10 mg total) by mouth every 6 (six) hours as needed (Nausea or vomiting). 30 tablet 1  . traMADol (ULTRAM) 50 MG tablet Take 1-2 tablets (50-100 mg total) by mouth every 6 (six) hours as needed. 60 tablet 0   No current facility-administered medications for this visit.     PHYSICAL EXAMINATION: ECOG PERFORMANCE STATUS: 1 - Symptomatic but completely ambulatory  Vitals:   03/15/19 0856  BP: 105/72  Pulse: 95  Resp: 18  Temp: 99.1 F (37.3 C)  SpO2:  100%   Filed Weights   03/15/19 0856  Weight: 140 lb 3.2 oz (63.6 kg)    GENERAL:alert, no distress and comfortable SKIN: no rash  EYES:  sclera clear OROPHARYNX: dentures. No thrush or ulcers LUNGS: clear with normal breathing effort HEART: regular rate & rhythm, no lower extremity edema NEURO: alert & oriented x 3 with fluent speech. Normal gait  RECTAL exam: exterior exam shows small <1 cm skin breakdown at right anus RUE PICC line site healing well, no edema or drainage   LABORATORY DATA:  I have reviewed the data as listed CBC Latest Ref Rng & Units 03/15/2019 03/11/2019 03/08/2019  WBC 4.0 - 10.5 K/uL 2.9(L) 1.9(L) 1.9(L)  Hemoglobin 12.0 - 15.0 g/dL 9.2(L) 8.5(L) 8.4(L)  Hematocrit 36.0 - 46.0 % 28.8(L) 25.4(L) 26.0(L)  Platelets 150 - 400 K/uL 243 148(L) 99(L)     CMP Latest Ref Rng & Units 03/15/2019 03/11/2019 03/08/2019  Glucose 70 - 99 mg/dL 100(H) 101(H) 114(H)  BUN 6 - 20 mg/dL 10 6 8   Creatinine 0.44 - 1.00 mg/dL 0.75 0.68 0.72  Sodium 135 - 145 mmol/L 140 139 140  Potassium 3.5 - 5.1 mmol/L 3.9 3.6 3.9  Chloride 98 - 111 mmol/L 104 107 107  CO2 22 - 32 mmol/L 25 24 23   Calcium 8.9 - 10.3 mg/dL 9.3 8.7(L) 9.2  Total Protein 6.5 - 8.1 g/dL 7.0 6.7 6.9  Total Bilirubin 0.3 - 1.2 mg/dL <0.2(L) <0.2(L) 0.2(L)  Alkaline Phos 38 - 126 U/L 75 73 71  AST 15 - 41 U/L 10(L) 12(L) 12(L)  ALT 0 - 44 U/L 12 15 13       RADIOGRAPHIC STUDIES: I have personally reviewed the radiological images as listed and agreed with the findings in the report. No results found.   ASSESSMENT & PLAN: Shariece Bodifordis a 63 y.o.African Americanfemalewith a history of HTN, IBD, ulcerative colitis  1.Squamous cell carcinoma of rectum -diagnosed 12/2018. Her CT scans shows low rectal wall thickeningwith abcessand surrounding lymphadenopathy, largest up to 28m. Chest scan also shows 2 tiny 470mright lung nodules which are likely benign.  -PET scan showed hypermetabolic  rectal wall thickening with hypermetabolic left perirectal adenopathy, consistent with locally advanced disease. No distant metastasis. -She began concurrent chemoRT with 5FU/mitomycin and radiation on 10/5  2. Anemia, GI Bleeding -Per patient she has had chronic mildly low anemia. She has been on oral ferrous sulfate.  -This has worsened lately secondary to her cancer and GI bleeding -she is tolerating oral iron every other day -She has worsening anemia lately secondary to chemo, she has increased fatigue which is likely related to anemia and chemoRT -Hgb improved to 9.2 today, denies recent bleeding  3. Rectal Pain, perirectal burning and itching -Secondary to #1  -takes 2 Tramadol at night which is effective  -her primary complaint is perirectal itching lately, secondary to radiation -hydrocortisone did not help; I discussed with PA AlShona Simpsonho recommends dermaplast spray -she can try oral benadryl PRN at night to help her sleep  -she has been using topical benadryl cream after treatments which has helped significantly   4. Ulcerative Colitis/IBD, Diverticulosis  -Found incidentally on 12/2017 screening colonoscopy  -On Lialda given by Dr GeCarlean Purlhich he recommends to continue as it primarily acts locally at the mucosal level in the colon, no significant systemic absorption.  5. HTN -On Lisinopril, will continue -she takes BP med every other day, BP stable  6. Hypercalcemia -Given prior bone density years ago she waspreviouslyon oral calcium which she stoppedrecently but she does take women's one a day vitamin with calcium which is on hold this for now -PTH related peptide and intact PTH are normal, her hypercalcemia is likely related to her cancer.  -Ifcalcium>12, will consider zometa -Ca normalized recently, 9.3 today  7. Nausea, GERD, mucositis -secondary to chemotherapy -improved with symptom management medication  Disposition:  Ms. BoGliddenppears  well. She continues chemoRT, tolerating well with mild to moderate fatigue, but able to maintain good PS. She has lost a few pounds each week, but states she is eating well. I encouraged her to continue to eat and drink well, and use supplements to maintain her weight. Local symptoms are controlled with topical anti-itch cream and tramadol 2 tabs at night, I refilled for her. CBC is improving. Neutropenia and thrombocytopenia resolved, anemia improving. CMP stable. She will continue RT. She will be referred to IR for PICC placement on 11/2 before restarting chemo. F/u next week before day 28 chemo.   Orders Placed This Encounter  Procedures  . IR Fluoro Guide CV Line Right    PICC for chemo starting 11/2, please place prior to lab/chemo appts at 8:45  Standing Status:   Future    Standing Expiration Date:   05/14/2020    Order Specific Question:   Reason for exam:    Answer:   needs access for chemo pump    Order Specific Question:   Is the patient pregnant?    Answer:   No    Order Specific Question:   Preferred Imaging Location?    Answer:   [facility]   All questions were answered. The patient knows to call the clinic with any problems, questions or concerns. No barriers to learning was detected.     Alla Feeling, NP 03/15/19

## 2019-03-15 ENCOUNTER — Inpatient Hospital Stay: Payer: BC Managed Care – PPO

## 2019-03-15 ENCOUNTER — Other Ambulatory Visit: Payer: Self-pay

## 2019-03-15 ENCOUNTER — Ambulatory Visit
Admission: RE | Admit: 2019-03-15 | Discharge: 2019-03-15 | Disposition: A | Discharge status: Home / Self Care | Payer: BC Managed Care – PPO | Source: Ambulatory Visit | Attending: Radiation Oncology | Admitting: Radiation Oncology

## 2019-03-15 ENCOUNTER — Inpatient Hospital Stay (HOSPITAL_BASED_OUTPATIENT_CLINIC_OR_DEPARTMENT_OTHER): Payer: BC Managed Care – PPO | Admitting: Nurse Practitioner

## 2019-03-15 ENCOUNTER — Encounter: Payer: Self-pay | Admitting: Nurse Practitioner

## 2019-03-15 VITALS — BP 105/72 | HR 95 | Temp 99.1°F | Resp 18 | Ht 65.0 in | Wt 140.2 lb

## 2019-03-15 DIAGNOSIS — C20 Malignant neoplasm of rectum: Secondary | ICD-10-CM | POA: Diagnosis not present

## 2019-03-15 DIAGNOSIS — Z5111 Encounter for antineoplastic chemotherapy: Secondary | ICD-10-CM | POA: Diagnosis not present

## 2019-03-15 LAB — CBC WITH DIFFERENTIAL (CANCER CENTER ONLY)
Abs Immature Granulocytes: 0.01 10*3/uL (ref 0.00–0.07)
Basophils Absolute: 0 10*3/uL (ref 0.0–0.1)
Basophils Relative: 1 %
Eosinophils Absolute: 0.1 10*3/uL (ref 0.0–0.5)
Eosinophils Relative: 5 %
HCT: 28.8 % — ABNORMAL LOW (ref 36.0–46.0)
Hemoglobin: 9.2 g/dL — ABNORMAL LOW (ref 12.0–15.0)
Immature Granulocytes: 0 %
Lymphocytes Relative: 10 %
Lymphs Abs: 0.3 10*3/uL — ABNORMAL LOW (ref 0.7–4.0)
MCH: 27.7 pg (ref 26.0–34.0)
MCHC: 31.9 g/dL (ref 30.0–36.0)
MCV: 86.7 fL (ref 80.0–100.0)
Monocytes Absolute: 0.4 10*3/uL (ref 0.1–1.0)
Monocytes Relative: 14 %
Neutro Abs: 2.1 10*3/uL (ref 1.7–7.7)
Neutrophils Relative %: 70 %
Platelet Count: 243 10*3/uL (ref 150–400)
RBC: 3.32 MIL/uL — ABNORMAL LOW (ref 3.87–5.11)
RDW: 14.2 % (ref 11.5–15.5)
WBC Count: 2.9 10*3/uL — ABNORMAL LOW (ref 4.0–10.5)
nRBC: 0 % (ref 0.0–0.2)

## 2019-03-15 LAB — CMP (CANCER CENTER ONLY)
ALT: 12 U/L (ref 0–44)
AST: 10 U/L — ABNORMAL LOW (ref 15–41)
Albumin: 3.4 g/dL — ABNORMAL LOW (ref 3.5–5.0)
Alkaline Phosphatase: 75 U/L (ref 38–126)
Anion gap: 11 (ref 5–15)
BUN: 10 mg/dL (ref 6–20)
CO2: 25 mmol/L (ref 22–32)
Calcium: 9.3 mg/dL (ref 8.9–10.3)
Chloride: 104 mmol/L (ref 98–111)
Creatinine: 0.75 mg/dL (ref 0.44–1.00)
GFR, Est AFR Am: >60 mL/min (ref >60)
GFR, Estimated: >60 mL/min (ref >60)
Glucose, Bld: 100 mg/dL — ABNORMAL HIGH (ref 70–99)
Potassium: 3.9 mmol/L (ref 3.5–5.1)
Sodium: 140 mmol/L (ref 135–145)
Total Bilirubin: <0.2 mg/dL — ABNORMAL LOW (ref 0.3–1.2)
Total Protein: 7 g/dL (ref 6.5–8.1)

## 2019-03-15 MED ORDER — TRAMADOL HCL 50 MG PO TABS: 50.0000 mg | ORAL_TABLET | Freq: Four times a day (QID) | ORAL | 0 refills | Status: DC | PRN

## 2019-03-16 ENCOUNTER — Ambulatory Visit
Admission: RE | Admit: 2019-03-16 | Discharge: 2019-03-16 | Disposition: A | Discharge status: Home / Self Care | Payer: BC Managed Care – PPO | Source: Ambulatory Visit | Attending: Radiation Oncology | Admitting: Radiation Oncology

## 2019-03-16 ENCOUNTER — Other Ambulatory Visit: Payer: Self-pay

## 2019-03-16 ENCOUNTER — Telehealth: Payer: Self-pay | Admitting: Nurse Practitioner

## 2019-03-16 DIAGNOSIS — C20 Malignant neoplasm of rectum: Secondary | ICD-10-CM | POA: Diagnosis not present

## 2019-03-16 NOTE — Telephone Encounter (Signed)
Cancelled appts per 10/26 los.  Called to inform the patient but the patient did not answer.

## 2019-03-17 ENCOUNTER — Other Ambulatory Visit: Payer: Self-pay

## 2019-03-17 ENCOUNTER — Ambulatory Visit
Admission: RE | Admit: 2019-03-17 | Discharge: 2019-03-17 | Disposition: A | Discharge status: Home / Self Care | Payer: BC Managed Care – PPO | Source: Ambulatory Visit | Attending: Radiation Oncology | Admitting: Radiation Oncology

## 2019-03-17 DIAGNOSIS — C20 Malignant neoplasm of rectum: Secondary | ICD-10-CM | POA: Diagnosis not present

## 2019-03-18 ENCOUNTER — Ambulatory Visit
Admission: RE | Admit: 2019-03-18 | Discharge: 2019-03-18 | Disposition: A | Discharge status: Home / Self Care | Payer: BC Managed Care – PPO | Source: Ambulatory Visit | Attending: Radiation Oncology | Admitting: Radiation Oncology

## 2019-03-18 ENCOUNTER — Other Ambulatory Visit: Payer: Self-pay

## 2019-03-18 DIAGNOSIS — C20 Malignant neoplasm of rectum: Secondary | ICD-10-CM | POA: Diagnosis not present

## 2019-03-19 ENCOUNTER — Other Ambulatory Visit: Payer: Self-pay

## 2019-03-19 ENCOUNTER — Ambulatory Visit
Admission: RE | Admit: 2019-03-19 | Discharge: 2019-03-19 | Disposition: A | Discharge status: Home / Self Care | Payer: BC Managed Care – PPO | Source: Ambulatory Visit | Attending: Radiation Oncology | Admitting: Radiation Oncology

## 2019-03-19 DIAGNOSIS — C20 Malignant neoplasm of rectum: Secondary | ICD-10-CM | POA: Diagnosis not present

## 2019-03-21 NOTE — Progress Notes (Signed)
[place]   [phone] [phone]   Clinic Follow up Note   Patient Care Team: Harrison Mons, PA as PCP - General (Family Medicine) Jovita Kussmaul, MD as Consulting Physician (General Surgery) Ileana Roup, MD as Consulting Physician (General Surgery) Kyung Rudd, MD as Consulting Physician (Radiation Oncology) Truitt Merle, MD as Consulting Physician (Hematology) Arna Snipe, RN as Oncology Nurse Navigator 03/22/2019  CHIEF COMPLAINT: F/u squamous cell carcinoma of rectum   SUMMARY OF ONCOLOGIC HISTORY: Oncology History Overview Note  Cancer Staging Squamous cell carcinoma of rectum [facility]) Staging form: Colon and Rectum, AJCC 8th Edition - Clinical stage from 02/04/2019: Stage IIIA (cT2, cN1a, cM0) - Signed by Truitt Merle, MD on 02/04/2019     Malignant neoplasm of anus ([place])  01/13/2019 Initial Diagnosis   Squamous cell carcinoma of rectum ([place])   01/14/2019 Imaging   CT Pelvis W Contrast 01/14/19 IMPRESSION: Perirectal abscess, at the left posterior aspect of the rectum, inseparable from the wall of the colon and the adjacent pelvic musculature. Largest diameter on cranial caudal imaging measures 4.8 cm.   Asymmetric thickening of the rectum associated with the abscess. A perforated rectal carcinoma is favored given the appearance, and correlation with physical exam as well anoscopy/rigid sigmoidoscopy. Alternatively, the changes may reflect chronic inflammation in the setting of inflammatory bowel disease.   There are several small lymph nodes within the perirectal fat and along the left pelvis, the largest measuring 13 mm, potentially pathologic in the setting of carcinoma, or alternatively reactive.   01/15/2019 Initial Diagnosis   Diagnosis 01/15/19 Soft tissue, abscess, rectal wall - INVASIVE SQUAMOUS CELL CARCINOMA, MODERATELY TO POORLY DIFFERENTIATED.   02/03/2019 Imaging   CT Chest W Contrast 02/03/19  IMPRESSION: 1.  Two nodules each measuring about 4 mm in long axis noted in the right upper lobe. One of these is subpleural. These are likely benign but given the context, surveillance imaging in 6-12 months time should be considered. Fleischner criteria for follow do not directly apply given the history of malignancy.   02/04/2019 Cancer Staging   Staging form: Colon and Rectum, AJCC 8th Edition - Clinical stage from 02/04/2019: Stage IIIA (cT2, cN1a, cM0) - Signed by Truitt Merle, MD on 02/04/2019   02/15/2019 PET scan   IMPRESSION: 1. Hypermetabolic eccentric rectal wall thickening with hypermetabolic left perirectal adenopathy. No evidence of distant metastatic disease. 2. Possible gallbladder sludge. 3. Left renal stone.   02/22/2019 -  Chemotherapy   ChemoRT with Mitomycin/5FU week 1 and 5 starting 02/22/19   02/22/2019 -  Radiation Therapy   ChemoRT with Dr. Sondra Come starting 02/22/19     CURRENT THERAPY: ChemoRT with 5FU and mitomycin starting 02/22/19   INTERVAL HISTORY: Ms. Basso returns for f/u and treatment as scheduled. She continues chemoRT starting 10/5. She was very tired over the weekend and rested a lot. She can accomplish tasks when needed. She has more rectal burning and itching. She filled tramadol but prefers not to take it. Anal itching is intermittent, "not all day every day." She has used coconut oil, vaseline, and A&D ointment. She reports normal BM, had 5 soft stools in last 24 hours. No bleeding. Takes imodium infrequently when needed. Denies n/v. Appetite is good. No further mouth sores other than after first chemo that lasted 3-4 days, did not limit po intake, and improved with magic mouthwash. Denies fever, chills, cough, chest pain, dyspnea, or neuropathy. She had PICC replaced today, tolerated well. She is somewhat tearful  tearful today, feeling the effects of treatment is making her emotional.    MEDICAL HISTORY:  Past Medical History:  Diagnosis Date   Herpes simplex disciform  keratitis 10/20/2013   Hypertension    IBD (inflammatory bowel disease) - colitis 01/11/2018   Nuclear sclerosis of both eyes 11/30/2014   Squamous cell carcinoma of rectum ([place]) 01/13/2019   UC (ulcerative colitis) ([place])     SURGICAL HISTORY: Past Surgical History:  Procedure Laterality Date   ABDOMINAL HYSTERECTOMY     BREAST CYST ASPIRATION Left 1983   COLONOSCOPY     INCISION AND DRAINAGE PERIRECTAL ABSCESS N/A 01/15/2019   Procedure: INTERNAL DRAINAGE OF PERIRECTAL ABSCESS;  Surgeon: Jovita Kussmaul, MD;  Location: [facility];  Service: General;  Laterality: N/A;   TUBAL LIGATION      I have reviewed the social history and family history with the patient and they are unchanged from previous note.  ALLERGIES:  has No Known Allergies.  MEDICATIONS:  Current Outpatient Medications  Medication Sig Dispense Refill   aspirin 81 MG tablet Take 81 mg by mouth daily.     ferrous sulfate 325 (65 FE) MG EC tablet Take 650 mg by mouth daily with breakfast.      lisinopril-hydrochlorothiazide (PRINZIDE,ZESTORETIC) 20-25 MG tablet Take 1 tablet by mouth daily. 90 tablet 3   mesalamine (LIALDA) 1.2 g EC tablet TAKE 2 TABLETS (2.4 G TOTAL) BY MOUTH DAILY WITH BREAKFAST. 180 tablet 3   Multiple Vitamins-Calcium (ONE-A-DAY WOMENS PO) Take 1 tablet by mouth daily.     naproxen sodium (ALEVE) 220 MG tablet Take 220 mg by mouth daily as needed (pain).      ondansetron (ZOFRAN) 8 MG tablet Take 1 tablet (8 mg total) by mouth 2 (two) times daily as needed (Nausea or vomiting). 30 tablet 1   prochlorperazine (COMPAZINE) 10 MG tablet Take 1 tablet (10 mg total) by mouth every 6 (six) hours as needed (Nausea or vomiting). 30 tablet 1   traMADol (ULTRAM) 50 MG tablet Take 1-2 tablets (50-100 mg total) by mouth every 6 (six) hours as needed. 60 tablet 0   No current facility-administered medications for this visit.    Facility-Administered Medications Ordered in Other Visits  Medication Dose  Route Frequency Provider Last Rate Last Dose   fluorouracil (ADRUCIL) 6,950 mg in sodium chloride 0.9 % 111 mL chemo infusion  1,000 mg/m2/day (Treatment Plan Recorded) Intravenous 4 days Truitt Merle, MD   6,950 mg at 03/22/19 1343   heparin lock flush 100 unit/mL  250 Units Intracatheter Once PRN Truitt Merle, MD       lidocaine (XYLOCAINE) 1 % (with pres) injection            sodium chloride flush (NS) 0.9 % injection 3 mL  3 mL Intracatheter PRN Truitt Merle, MD   3 mL at 03/22/19 1344    PHYSICAL EXAMINATION: ECOG PERFORMANCE STATUS: 1-2  Vitals:   03/22/19 1015  BP: 126/83  Pulse: 94  Resp: 18  Temp: 98.9 F (37.2 C)  SpO2: 99%   Filed Weights   03/22/19 1015  Weight: 145 lb 9.6 oz (66 kg)    GENERAL:alert, no distress and comfortable SKIN: no rash EYES:  sclera clear LUNGS: respirations even and unlabored, normal breathing effort HEART:  no lower extremity edema NEURO: alert & oriented x 3 with fluent speech, normal gait PICC without erythema  RECTAL: external exam reveals hyperpigmentation and mild- moderate moist desquamation to perineum.  PICC without erythema  LABORATORY DATA:  I have reviewed the data as listed CBC Latest Ref Rng & Units 03/22/2019 03/15/2019 03/11/2019  WBC 4.0 - 10.5 K/uL 4.6 2.9(L) 1.9(L)  Hemoglobin 12.0 - 15.0 g/dL 8.6(L) 9.2(L) 8.5(L)  Hematocrit 36.0 - 46.0 % 26.9(L) 28.8(L) 25.4(L)  Platelets 150 - 400 K/uL 242 243 148(L)     CMP Latest Ref Rng & Units 03/22/2019 03/15/2019 03/11/2019  Glucose 70 - 99 mg/dL 99 100(H) 101(H)  BUN 8 - 23 mg/dL _0 Creatinine 0.44 - 1.00 mg/dL 0.69 0.75 0.68  Sodium 135 - 145 mmol/L 141 140 139  Potassium 3.5 - 5.1 mmol/L 3.6 3.9 3.6  Chloride 98 - 111 mmol/L 104 104 107  CO2 22 - 32 mmol/L _1 Calcium 8.9 - 10.3 mg/dL 8.9 9.3 8.7(L)  Total Protein 6.5 - 8.1 g/dL 6.7 7.0 6.7  Total Bilirubin 0.3 - 1.2 mg/dL <0.2(L) <0.2(L) <0.2(L)  Alkaline Phos 38 - 126 U/L 63 75 73  AST 15 - 41 U/L 11(L)  10(L) 12(L)  ALT 0 - 44 U/L _2 RADIOGRAPHIC STUDIES: I have personally reviewed the radiological images as listed and agreed with the findings in the report. Ir Picc Placement Right >5 Yrs Inc Img Guide  Result Date: 03/22/2019 INDICATION: History of rectal cancer, in need of durable intravenous access for chemotherapy administration. EXAM: ULTRASOUND AND FLUOROSCOPIC GUIDED PICC LINE INSERTION MEDICATIONS: None. CONTRAST:  None FLUOROSCOPY TIME:  6 seconds (12 mGy) COMPLICATIONS: None immediate. TECHNIQUE: The procedure, risks, benefits, and alternatives were explained to the patient and informed written consent was obtained. A timeout was performed prior to the initiation of the procedure. The right upper extremity was prepped with chlorhexidine in a sterile fashion, and a sterile drape was applied covering the operative field. Maximum barrier sterile technique with sterile gowns and gloves were used for the procedure. A timeout was performed prior to the initiation of the procedure. Local anesthesia was provided with 1% lidocaine. Under direct ultrasound guidance, the brachial vein was accessed with a micropuncture kit after the overlying soft tissues were anesthetized with 1% lidocaine. After the overlying soft tissues were anesthetized, a small venotomy incision was created and a micropuncture kit was utilized to access the right brachial vein. Real-time ultrasound guidance was utilized for vascular access including the acquisition of a permanent ultrasound image documenting patency of the accessed vessel. A guidewire was advanced to the level of the superior caval-atrial junction for measurement purposes and the PICC line was cut to length. A peel-away sheath was placed and a 35 cm, 5 [place], single lumen was inserted to level of the superior caval-atrial junction. A post procedure spot fluoroscopic was obtained. The catheter easily aspirated and flushed and was sutured in place. A  dressing was placed. The patient tolerated the procedure well without immediate post procedural complication. FINDINGS: After catheter placement, the tip lies within the superior cavoatrial junction. The catheter aspirates and flushes normally and is ready for immediate use. IMPRESSION: Successful ultrasound and fluoroscopic guided placement of a right brachial vein approach, 35 cm, 5 [place], single lumen PICC with tip at the superior caval-atrial junction. The PICC line is ready for immediate use. Electronically Signed   By: Sandi Mariscal M.D.   On: 03/22/2019 09:53     ASSESSMENT & PLAN: Cecilie Bodifordis a 63 y.o.African Americanfemalewith a history of HTN, IBD, ulcerative colitis  1.Squamous cell carcinoma of rectum -diagnosed 12/2018. Her CT scans shows  low rectal wall thickeningwith abcessand surrounding lymphadenopathy, largest up to 3m. Chest scan also shows 2 tiny 422mright lung nodules which are likely benign.  -PET scan showed hypermetabolic rectal wall thickening with hypermetabolic left perirectal adenopathy, consistent with locally advanced disease. No distant metastasis. -She began concurrent chemoRT with 5FU/mitomycin and radiation on 10/5 -she tolerated moderately well with nausea, GERD, and mucositis after week 1, she responded well to symptom management -she has been doing well in the interim while continuing RT. Today she appears more tired and emotional, side effects are overwhelming her. She has good attitude and is determined to finish.  -she will see dietician today who also referred her to SW/chaplain for support  -labs reviewed, CBC and CMP adequate for treatment -she will discuss local symptoms with RT, especially skin management; exam shows mild to moderate desquamation -she will proceed with cycle 1 day 28 5FU /mitomycin today -she will return for pump and PICC d/c on 11/6 -lab and f/u next week   2. Anemia, GI Bleeding -Per patient she has had chronic  mildly low anemia. She has been on oral ferrous sulfate.  -This has worsened lately secondary to her cancer and GI bleeding -she is tolerating oral ironevery other day -She has worsening anemia lately secondary to chemo, she has increased fatigue which is likely related to anemia and chemoRT -Hgb improved to 9.2 last week; but 8.6 today. She notes she has not been eating as much leafy greens  -she is more fatigued, but can be functional with effort.  -Will monitor labs closely and transfuse if needed   3. Rectal Pain, perirectal burning and itching -Secondary to #1 -takes 2 Tramadol at night which is effective  -her primary complaint is perirectal itching lately, secondary to radiation -hydrocortisone did not help; I discussed with PA AlShona Simpsonho recommends dermaplast spray -she can try oral benadryl PRN at night to help her sleep -she has been using topical benadryl cream after treatments which has helped significantly  -Her pain and skin breakdown worsened during week 4, but not every day; she tried vaseline, coconut oil, and A&D ointment -I reviewed using multiple agents may worsen her skin breakdown, she will discuss with RT what to use  4. Ulcerative Colitis/IBD, Diverticulosis  -Found incidentally on 12/2017 screening colonoscopy  -On Lialda given by Dr GeRedmond Basemane recommends to continue as it primarily acts locally at the mucosal level in the colon, no significant systemic absorption.  5. HTN -On Lisinopril, will continue -she takes BP med every other day, BP stable  6. Hypercalcemia -Given prior bone density years ago she waspreviouslyon oral calcium which she stoppedrecently but she does take women's one a day vitamin with calciumwhich is on hold this for now -PTH related peptide and intact PTH are normal, her hypercalcemiais likely related to her cancer. -Ifcalcium>12, will consider zometa -Ca remains normal lately   7. Nausea, GERD,  mucositis -secondary to chemotherapy -improved with symptom management medication  PLAN: -Labs reviewed -Proceed with day 28 5FU and mitomycin -Pump and PICC d/c on 11/6 -Symptom management with topical treatment per rad onc -Lab, f/u in 1 and 2 weeks  -Final RT treatment 11/17   All questions were answered. The patient knows to call the clinic with any problems, questions or concerns. No barriers to learning was detected. I spent 20 minutes counseling the patient face to face. The total time spent in the appointment was 25 minutes and more than 50% was on counseling and review  of test results     Alla Feeling, NP 03/22/19

## 2019-03-22 ENCOUNTER — Inpatient Hospital Stay: Payer: BC Managed Care – PPO | Admitting: Nutrition

## 2019-03-22 ENCOUNTER — Ambulatory Visit (HOSPITAL_COMMUNITY)
Admission: RE | Admit: 2019-03-22 | Discharge: 2019-03-22 | Disposition: A | Discharge status: Home / Self Care | Payer: BC Managed Care – PPO | Source: Ambulatory Visit | Attending: Nurse Practitioner | Admitting: Nurse Practitioner

## 2019-03-22 ENCOUNTER — Other Ambulatory Visit: Payer: Self-pay | Admitting: Nurse Practitioner

## 2019-03-22 ENCOUNTER — Encounter: Payer: Self-pay | Admitting: Nurse Practitioner

## 2019-03-22 ENCOUNTER — Inpatient Hospital Stay (HOSPITAL_BASED_OUTPATIENT_CLINIC_OR_DEPARTMENT_OTHER): Payer: BC Managed Care – PPO | Admitting: Nurse Practitioner

## 2019-03-22 ENCOUNTER — Ambulatory Visit
Admission: RE | Admit: 2019-03-22 | Discharge: 2019-03-22 | Disposition: A | Discharge status: Home / Self Care | Payer: BC Managed Care – PPO | Source: Ambulatory Visit | Attending: Radiation Oncology | Admitting: Radiation Oncology

## 2019-03-22 ENCOUNTER — Inpatient Hospital Stay: Payer: BC Managed Care – PPO | Attending: Hematology

## 2019-03-22 ENCOUNTER — Telehealth: Payer: Self-pay | Admitting: Nurse Practitioner

## 2019-03-22 ENCOUNTER — Other Ambulatory Visit: Payer: Self-pay

## 2019-03-22 ENCOUNTER — Inpatient Hospital Stay: Payer: BC Managed Care – PPO

## 2019-03-22 ENCOUNTER — Encounter: Payer: Self-pay | Admitting: General Practice

## 2019-03-22 VITALS — BP 126/83 | HR 94 | Temp 98.9°F | Resp 18 | Ht 65.0 in | Wt 145.6 lb

## 2019-03-22 DIAGNOSIS — K219 Gastro-esophageal reflux disease without esophagitis: Secondary | ICD-10-CM | POA: Insufficient documentation

## 2019-03-22 DIAGNOSIS — I10 Essential (primary) hypertension: Secondary | ICD-10-CM | POA: Insufficient documentation

## 2019-03-22 DIAGNOSIS — K5190 Ulcerative colitis, unspecified, without complications: Secondary | ICD-10-CM | POA: Diagnosis not present

## 2019-03-22 DIAGNOSIS — C20 Malignant neoplasm of rectum: Secondary | ICD-10-CM

## 2019-03-22 DIAGNOSIS — D6481 Anemia due to antineoplastic chemotherapy: Secondary | ICD-10-CM | POA: Insufficient documentation

## 2019-03-22 DIAGNOSIS — C210 Malignant neoplasm of anus, unspecified: Secondary | ICD-10-CM

## 2019-03-22 DIAGNOSIS — Z5111 Encounter for antineoplastic chemotherapy: Secondary | ICD-10-CM | POA: Insufficient documentation

## 2019-03-22 DIAGNOSIS — Z452 Encounter for adjustment and management of vascular access device: Secondary | ICD-10-CM | POA: Insufficient documentation

## 2019-03-22 DIAGNOSIS — G893 Neoplasm related pain (acute) (chronic): Secondary | ICD-10-CM | POA: Insufficient documentation

## 2019-03-22 LAB — CMP (CANCER CENTER ONLY)
ALT: 10 U/L (ref 0–44)
AST: 11 U/L — ABNORMAL LOW (ref 15–41)
Albumin: 3.2 g/dL — ABNORMAL LOW (ref 3.5–5.0)
Alkaline Phosphatase: 63 U/L (ref 38–126)
Anion gap: 9 (ref 5–15)
BUN: 8 mg/dL (ref 8–23)
CO2: 28 mmol/L (ref 22–32)
Calcium: 8.9 mg/dL (ref 8.9–10.3)
Chloride: 104 mmol/L (ref 98–111)
Creatinine: 0.69 mg/dL (ref 0.44–1.00)
GFR, Est AFR Am: >60 mL/min (ref >60)
GFR, Estimated: >60 mL/min (ref >60)
Glucose, Bld: 99 mg/dL (ref 70–99)
Potassium: 3.6 mmol/L (ref 3.5–5.1)
Sodium: 141 mmol/L (ref 135–145)
Total Bilirubin: <0.2 mg/dL — ABNORMAL LOW (ref 0.3–1.2)
Total Protein: 6.7 g/dL (ref 6.5–8.1)

## 2019-03-22 LAB — CBC WITH DIFFERENTIAL (CANCER CENTER ONLY)
Abs Immature Granulocytes: 0.03 10*3/uL (ref 0.00–0.07)
Basophils Absolute: 0 10*3/uL (ref 0.0–0.1)
Basophils Relative: 0 %
Eosinophils Absolute: 0.3 10*3/uL (ref 0.0–0.5)
Eosinophils Relative: 6 %
HCT: 26.9 % — ABNORMAL LOW (ref 36.0–46.0)
Hemoglobin: 8.6 g/dL — ABNORMAL LOW (ref 12.0–15.0)
Immature Granulocytes: 1 %
Lymphocytes Relative: 2 %
Lymphs Abs: 0.1 10*3/uL — ABNORMAL LOW (ref 0.7–4.0)
MCH: 27.6 pg (ref 26.0–34.0)
MCHC: 32 g/dL (ref 30.0–36.0)
MCV: 86.2 fL (ref 80.0–100.0)
Monocytes Absolute: 0.4 10*3/uL (ref 0.1–1.0)
Monocytes Relative: 8 %
Neutro Abs: 3.8 10*3/uL (ref 1.7–7.7)
Neutrophils Relative %: 83 %
Platelet Count: 242 10*3/uL (ref 150–400)
RBC: 3.12 MIL/uL — ABNORMAL LOW (ref 3.87–5.11)
RDW: 15 % (ref 11.5–15.5)
WBC Count: 4.6 10*3/uL (ref 4.0–10.5)
nRBC: 0 % (ref 0.0–0.2)

## 2019-03-22 MED ORDER — MITOMYCIN CHEMO IV INJECTION 20 MG
9.9000 mg/m2 | Freq: Once | INTRAVENOUS | Status: AC
  Administered 2019-03-22: 17 mg via INTRAVENOUS
  Filled 2019-03-22: qty 34

## 2019-03-22 MED ORDER — HEPARIN SOD (PORK) LOCK FLUSH 100 UNIT/ML IV SOLN
250.0000 [IU] | Freq: Once | INTRAVENOUS | Status: DC | PRN
  Filled 2019-03-22: qty 5

## 2019-03-22 MED ORDER — LIDOCAINE HCL 1 % IJ SOLN
INTRAMUSCULAR | Status: AC
  Filled 2019-03-22: qty 20

## 2019-03-22 MED ORDER — PROCHLORPERAZINE MALEATE 10 MG PO TABS
10.0000 mg | ORAL_TABLET | Freq: Once | ORAL | Status: AC
  Administered 2019-03-22: 10 mg via ORAL

## 2019-03-22 MED ORDER — SODIUM CHLORIDE 0.9 % IV SOLN
Freq: Once | INTRAVENOUS | Status: AC
  Administered 2019-03-22: 11:00:00 via INTRAVENOUS
  Filled 2019-03-22: qty 250

## 2019-03-22 MED ORDER — PROCHLORPERAZINE MALEATE 10 MG PO TABS
ORAL_TABLET | ORAL | Status: AC
  Filled 2019-03-22: qty 1

## 2019-03-22 MED ORDER — SODIUM CHLORIDE 0.9 % IV SOLN
1000.0000 mg/m2/d | INTRAVENOUS | Status: DC
  Administered 2019-03-22: 6950 mg via INTRAVENOUS
  Filled 2019-03-22: qty 139

## 2019-03-22 MED ORDER — SODIUM CHLORIDE 0.9% FLUSH
3.0000 mL | INTRAVENOUS | Status: DC | PRN
  Administered 2019-03-22: 3 mL
  Filled 2019-03-22: qty 10

## 2019-03-22 NOTE — Telephone Encounter (Signed)
Scheduled appt per 11/2 los.  Called infusion and they will give the patient an updated appt calendar.

## 2019-03-22 NOTE — Patient Instructions (Signed)
[facility] [place] Discharge Instructions for Patients Receiving Chemotherapy  Today you received the following chemotherapy agents mitomycin; adrucil  To help prevent nausea and vomiting after your treatment, we encourage you to take your nausea medication as directed   If you develop nausea and vomiting that is not controlled by your nausea medication, call the clinic.   BELOW ARE SYMPTOMS THAT SHOULD BE REPORTED IMMEDIATELY:  *FEVER GREATER THAN 100.5 F  *CHILLS WITH OR WITHOUT FEVER  NAUSEA AND VOMITING THAT IS NOT CONTROLLED WITH YOUR NAUSEA MEDICATION  *UNUSUAL SHORTNESS OF BREATH  *UNUSUAL BRUISING OR BLEEDING  TENDERNESS IN MOUTH AND THROAT WITH OR WITHOUT PRESENCE OF ULCERS  *URINARY PROBLEMS  *BOWEL PROBLEMS  UNUSUAL RASH Items with * indicate a potential emergency and should be followed up as soon as possible.  Feel free to call the clinic should you have any questions or concerns. The clinic phone number is [phone].  Please show the [facility] at check-in to the Emergency Department and triage nurse.  Mitomycin injection What is this medicine? MITOMYCIN (mye toe MYE sin) is a chemotherapy drug. This medicine is used to treat cancer of the stomach and pancreas. This medicine may be used for other purposes; ask your health care provider or pharmacist if you have questions. COMMON BRAND NAME(S): Mutamycin What should I tell my health care provider before I take this medicine? They need to know if you have any of these conditions:  anemia  bleeding disorder  infection (especially a virus infection such as chickenpox, cold sores, or herpes)  kidney disease  low blood counts like low platelets, red blood cells, white blood cells  recent radiation therapy  an unusual or allergic reaction to mitomycin, other chemotherapy agents, other medicines, foods, dyes, or preservatives  pregnant or trying to get pregnant  breast-feeding How  should I use this medicine? This drug is given as an injection or infusion into a vein. It is administered in a hospital or clinic by a specially trained health care professional. Talk to your pediatrician regarding the use of this medicine in children. Special care may be needed. Overdosage: If you think you have taken too much of this medicine contact a poison control center or emergency room at once. NOTE: This medicine is only for you. Do not share this medicine with others. What if I miss a dose? It is important not to miss your dose. Call your doctor or health care professional if you are unable to keep an appointment. What may interact with this medicine?  medicines to increase blood counts like filgrastim, pegfilgrastim, sargramostim  vaccines This list may not describe all possible interactions. Give your health care provider a list of all the medicines, herbs, non-prescription drugs, or dietary supplements you use. Also tell them if you smoke, drink alcohol, or use illegal drugs. Some items may interact with your medicine. What should I watch for while using this medicine? Your condition will be monitored carefully while you are receiving this medicine. You will need important blood work done while you are taking this medicine. This drug may make you feel generally unwell. This is not uncommon, as chemotherapy can affect healthy cells as well as cancer cells. Report any side effects. Continue your course of treatment even though you feel ill unless your doctor tells you to stop. Call your doctor or health care professional for advice if you get a fever, chills or sore throat, or other symptoms of a cold or flu.  Do not treat yourself. This drug decreases your body's ability to fight infections. Try to avoid being around people who are sick. This medicine may increase your risk to bruise or bleed. Call your doctor or health care professional if you notice any unusual bleeding. Be careful  brushing and flossing your teeth or using a toothpick because you may get an infection or bleed more easily. If you have any dental work done, tell your dentist you are receiving this medicine. Avoid taking products that contain aspirin, acetaminophen, ibuprofen, naproxen, or ketoprofen unless instructed by your doctor. These medicines may hide a fever. Do not become pregnant while taking this medicine. Women should inform their doctor if they wish to become pregnant or think they might be pregnant. There is a potential for serious side effects to an unborn child. Talk to your health care professional or pharmacist for more information. Do not breast-feed an infant while taking this medicine. What side effects may I notice from receiving this medicine? Side effects that you should report to your doctor or health care professional as soon as possible:  allergic reactions like skin rash, itching or hives, swelling of the face, lips, or tongue  low blood counts - this medicine may decrease the number of white blood cells, red blood cells and platelets. You may be at increased risk for infections and bleeding.  signs of infection - fever or chills, cough, sore throat, pain or difficulty passing urine  signs of decreased platelets or bleeding - bruising, pinpoint red spots on the skin, black, tarry stools, blood in the urine  signs of decreased red blood cells - unusually weak or tired, fainting spells, lightheadedness  breathing problems  changes in vision  chest pain  confusion  dry cough  high blood pressure  mouth sores  pain, swelling, redness at site where injected  pain, tingling, numbness in the hands or feet  seizures  swelling of the ankles, feet, hands  trouble passing urine or change in the amount of urine Side effects that usually do not require medical attention (report to your doctor or health care professional if they continue or are bothersome):  diarrhea  green  to blue color of urine  hair loss  loss of appetite  nausea, vomiting This list may not describe all possible side effects. Call your doctor for medical advice about side effects. You may report side effects to FDA at 1-800-FDA-1088. Where should I keep my medicine? This drug is given in a hospital or clinic and will not be stored at home. NOTE: This sheet is a summary. It may not cover all possible information. If you have questions about this medicine, talk to your doctor, pharmacist, or health care provider.  2020 Elsevier/Gold Standard (2007-11-12 11:16:23)  Fluorouracil, 5-FU injection What is this medicine? FLUOROURACIL, 5-FU (flure oh YOOR a sil) is a chemotherapy drug. It slows the growth of cancer cells. This medicine is used to treat many types of cancer like breast cancer, colon or rectal cancer, pancreatic cancer, and stomach cancer. This medicine may be used for other purposes; ask your health care provider or pharmacist if you have questions. COMMON BRAND NAME(S): Adrucil What should I tell my health care provider before I take this medicine? They need to know if you have any of these conditions:  blood disorders  dihydropyrimidine dehydrogenase (DPD) deficiency  infection (especially a virus infection such as chickenpox, cold sores, or herpes)  kidney disease  liver disease  malnourished, poor nutrition  recent or ongoing radiation therapy  an unusual or allergic reaction to fluorouracil, other chemotherapy, other medicines, foods, dyes, or preservatives  pregnant or trying to get pregnant  breast-feeding How should I use this medicine? This drug is given as an infusion or injection into a vein. It is administered in a hospital or clinic by a specially trained health care professional. Talk to your pediatrician regarding the use of this medicine in children. Special care may be needed. Overdosage: If you think you have taken too much of this medicine contact  a poison control center or emergency room at once. NOTE: This medicine is only for you. Do not share this medicine with others. What if I miss a dose? It is important not to miss your dose. Call your doctor or health care professional if you are unable to keep an appointment. What may interact with this medicine?  allopurinol  cimetidine  dapsone  digoxin  hydroxyurea  leucovorin  levamisole  medicines for seizures like ethotoin, fosphenytoin, phenytoin  medicines to increase blood counts like filgrastim, pegfilgrastim, sargramostim  medicines that treat or prevent blood clots like warfarin, enoxaparin, and dalteparin  methotrexate  metronidazole  pyrimethamine  some other chemotherapy drugs like busulfan, cisplatin, estramustine, vinblastine  trimethoprim  trimetrexate  vaccines Talk to your doctor or health care professional before taking any of these medicines:  acetaminophen  aspirin  ibuprofen  ketoprofen  naproxen This list may not describe all possible interactions. Give your health care provider a list of all the medicines, herbs, non-prescription drugs, or dietary supplements you use. Also tell them if you smoke, drink alcohol, or use illegal drugs. Some items may interact with your medicine. What should I watch for while using this medicine? Visit your doctor for checks on your progress. This drug may make you feel generally unwell. This is not uncommon, as chemotherapy can affect healthy cells as well as cancer cells. Report any side effects. Continue your course of treatment even though you feel ill unless your doctor tells you to stop. In some cases, you may be given additional medicines to help with side effects. Follow all directions for their use. Call your doctor or health care professional for advice if you get a fever, chills or sore throat, or other symptoms of a cold or flu. Do not treat yourself. This drug decreases your body's ability to  fight infections. Try to avoid being around people who are sick. This medicine may increase your risk to bruise or bleed. Call your doctor or health care professional if you notice any unusual bleeding. Be careful brushing and flossing your teeth or using a toothpick because you may get an infection or bleed more easily. If you have any dental work done, tell your dentist you are receiving this medicine. Avoid taking products that contain aspirin, acetaminophen, ibuprofen, naproxen, or ketoprofen unless instructed by your doctor. These medicines may hide a fever. Do not become pregnant while taking this medicine. Women should inform their doctor if they wish to become pregnant or think they might be pregnant. There is a potential for serious side effects to an unborn child. Talk to your health care professional or pharmacist for more information. Do not breast-feed an infant while taking this medicine. Men should inform their doctor if they wish to father a child. This medicine may lower sperm counts. Do not treat diarrhea with over the counter products. Contact your doctor if you have diarrhea that lasts more than 2 days or if it  is severe and watery. This medicine can make you more sensitive to the sun. Keep out of the sun. If you cannot avoid being in the sun, wear protective clothing and use sunscreen. Do not use sun lamps or tanning beds/booths. What side effects may I notice from receiving this medicine? Side effects that you should report to your doctor or health care professional as soon as possible:  allergic reactions like skin rash, itching or hives, swelling of the face, lips, or tongue  low blood counts - this medicine may decrease the number of white blood cells, red blood cells and platelets. You may be at increased risk for infections and bleeding.  signs of infection - fever or chills, cough, sore throat, pain or difficulty passing urine  signs of decreased platelets or bleeding -  bruising, pinpoint red spots on the skin, black, tarry stools, blood in the urine  signs of decreased red blood cells - unusually weak or tired, fainting spells, lightheadedness  breathing problems  changes in vision  chest pain  mouth sores  nausea and vomiting  pain, swelling, redness at site where injected  pain, tingling, numbness in the hands or feet  redness, swelling, or sores on hands or feet  stomach pain  unusual bleeding Side effects that usually do not require medical attention (report to your doctor or health care professional if they continue or are bothersome):  changes in finger or toe nails  diarrhea  dry or itchy skin  hair loss  headache  loss of appetite  sensitivity of eyes to the light  stomach upset  unusually teary eyes This list may not describe all possible side effects. Call your doctor for medical advice about side effects. You may report side effects to FDA at 1-800-FDA-1088. Where should I keep my medicine? This drug is given in a hospital or clinic and will not be stored at home. NOTE: This sheet is a summary. It may not cover all possible information. If you have questions about this medicine, talk to your doctor, pharmacist, or health care provider.  2020 Elsevier/Gold Standard (2007-09-09 13:53:16)

## 2019-03-22 NOTE — Procedures (Signed)
Pre procedural Diagnosis: Poor venous access Post Procedural Diagnosis: Same  Successful placement of right brachial vein approach 35 cm single lumen PICC line with tip at the superior caval-atrial junction.    EBL: None  No immediate post procedural complication.  The PICC line is ready for immediate use.  Ronny Bacon, MD Pager #: [phone]

## 2019-03-22 NOTE — Progress Notes (Signed)
[facility] Spiritual Care Note  Referred by Pamala Hurry Neff/RD for spiritual/emotional support. LVM encouraging callback.    [place], [place], [facility] Pager [phone] Voicemail [phone]

## 2019-03-22 NOTE — Progress Notes (Signed)
Nutrition follow-up completed with patient during infusion for anal cancer.   Patient is receiving concurrent chemoradiation therapy. Patient's weight improved and was documented as 145.6 pounds on November 2.  This is increased from 140.2 pounds on October 26. Patient reports she is feeling better. She is eating well. She has no nutrition concerns. Patient shares that she is afraid to find out the outcome of her treatments.  She reports people tell her she needs to be strong but she does not feel strong.  Nutrition diagnosis: Food and nutrition related knowledge deficit resolved.  I have encouraged patient to continue strategies for adequate calorie and protein intake.  I provided active listening for patient and she has agreed to a referral to either the chaplain or social worker so she can receive additional emotional support.  I have encouraged her to contact me with any further nutrition concerns.  She has my contact information.  **Disclaimer: This note was dictated with voice recognition software. Similar sounding words can inadvertently be transcribed and this note may contain transcription errors which may not have been corrected upon publication of note.**

## 2019-03-23 ENCOUNTER — Other Ambulatory Visit: Payer: Self-pay

## 2019-03-23 ENCOUNTER — Ambulatory Visit
Admission: RE | Admit: 2019-03-23 | Discharge: 2019-03-23 | Disposition: A | Discharge status: Home / Self Care | Payer: BC Managed Care – PPO | Source: Ambulatory Visit | Attending: Radiation Oncology | Admitting: Radiation Oncology

## 2019-03-23 ENCOUNTER — Encounter: Payer: BC Managed Care – PPO | Admitting: Nutrition

## 2019-03-23 ENCOUNTER — Ambulatory Visit: Payer: BC Managed Care – PPO

## 2019-03-23 DIAGNOSIS — C20 Malignant neoplasm of rectum: Secondary | ICD-10-CM | POA: Diagnosis not present

## 2019-03-24 ENCOUNTER — Encounter: Payer: Self-pay | Admitting: General Practice

## 2019-03-24 ENCOUNTER — Other Ambulatory Visit: Payer: Self-pay

## 2019-03-24 ENCOUNTER — Ambulatory Visit
Admission: RE | Admit: 2019-03-24 | Discharge: 2019-03-24 | Disposition: A | Discharge status: Home / Self Care | Payer: BC Managed Care – PPO | Source: Ambulatory Visit | Attending: Radiation Oncology | Admitting: Radiation Oncology

## 2019-03-24 ENCOUNTER — Ambulatory Visit: Payer: BC Managed Care – PPO

## 2019-03-24 DIAGNOSIS — C20 Malignant neoplasm of rectum: Secondary | ICD-10-CM | POA: Diagnosis not present

## 2019-03-24 NOTE — Progress Notes (Signed)
[facility] Spiritual Care Note  Reached Ms Burklow by phone per referral from [facility] Neff/RD. Ms Chesterfield was very receptive to chaplain support, stating, "You just don't know how much this call means to me." Provided empathic listening, normalization of feelings, introduction to Spiritual Care, and encouragement to phone my direct dial number whenever needed/desired. She plans to call in the next few days, and I will reach out in ca 2 weeks if I haven't heard from her.   [place], [place], [facility] Pager [phone] Voicemail [phone]

## 2019-03-25 ENCOUNTER — Other Ambulatory Visit: Payer: Self-pay

## 2019-03-25 ENCOUNTER — Ambulatory Visit
Admission: RE | Admit: 2019-03-25 | Discharge: 2019-03-25 | Disposition: A | Discharge status: Home / Self Care | Payer: BC Managed Care – PPO | Source: Ambulatory Visit | Attending: Radiation Oncology | Admitting: Radiation Oncology

## 2019-03-25 ENCOUNTER — Ambulatory Visit: Payer: BC Managed Care – PPO

## 2019-03-25 DIAGNOSIS — C20 Malignant neoplasm of rectum: Secondary | ICD-10-CM | POA: Diagnosis not present

## 2019-03-26 ENCOUNTER — Ambulatory Visit: Payer: BC Managed Care – PPO

## 2019-03-26 ENCOUNTER — Other Ambulatory Visit: Payer: Self-pay

## 2019-03-26 ENCOUNTER — Ambulatory Visit
Admission: RE | Admit: 2019-03-26 | Discharge: 2019-03-26 | Disposition: A | Discharge status: Home / Self Care | Payer: BC Managed Care – PPO | Source: Ambulatory Visit | Attending: Radiation Oncology | Admitting: Radiation Oncology

## 2019-03-26 ENCOUNTER — Inpatient Hospital Stay: Payer: BC Managed Care – PPO

## 2019-03-26 VITALS — BP 119/70 | HR 93 | Temp 99.1°F | Resp 18

## 2019-03-26 DIAGNOSIS — C20 Malignant neoplasm of rectum: Secondary | ICD-10-CM

## 2019-03-26 DIAGNOSIS — C210 Malignant neoplasm of anus, unspecified: Secondary | ICD-10-CM

## 2019-03-26 DIAGNOSIS — Z5111 Encounter for antineoplastic chemotherapy: Secondary | ICD-10-CM | POA: Diagnosis not present

## 2019-03-26 MED ORDER — SODIUM CHLORIDE 0.9% FLUSH
10.0000 mL | INTRAVENOUS | Status: DC | PRN
  Administered 2019-03-26: 10 mL
  Filled 2019-03-26: qty 10

## 2019-03-26 MED ORDER — HEPARIN SOD (PORK) LOCK FLUSH 100 UNIT/ML IV SOLN
500.0000 [IU] | Freq: Once | INTRAVENOUS | Status: AC | PRN
  Administered 2019-03-26: 250 [IU]
  Filled 2019-03-26: qty 5

## 2019-03-26 NOTE — Patient Instructions (Signed)
PICC Removal, Adult, Care After This sheet gives you information about how to care for yourself after your procedure. Your health care provider may also give you more specific instructions. If you have problems or questions, contact your health care provider. What can I expect after the procedure? After your procedure, it is common to have:  Tenderness or soreness.  Redness, swelling, or a scab where the PICC was removed (exit site). Follow these instructions at home: For the first 24 hours after the procedure   Keep the bandage (dressing) on the exit site clean and dry. Do not remove the dressing until your health care provider tells you to do so.  Check your arm often for signs and symptoms of an infection. Check for: ? A red streak that spreads away from the dressing. ? Blood or fluid that you can see on the dressing. ? More redness or swelling.  Do not lift anything heavy or do activities that require great effort until your health care provider says it is okay. You should avoid: ? Lifting weights. ? Yard work. ? Any physical activity with repetitive arm movement.  Watch closely for any signs of an air bubble in the vein (air embolism). This is a rare but serious complication. If you have signs of air embolism, call 911 immediately and lie down on your left side to keep the air from moving into the lungs. Signs of an air embolism include: ? Difficulty breathing. ? Chest pain. ? Coughing or wheezing. ? Skin that is pale, blue, cold, or clammy. ? Rapid pulse. ? Rapid breathing. ? Fainting. After 24 Hours have passed:  Remove your dressing as told by your health care provider. Make sure you wash your hands with soap and water before and after you change the dressing. If soap and water are not available, use hand sanitizer.  Return to your normal activities as told by your health care provider.  A small scab may develop over the exit site. Do not pick at the scab.  When  bathing or showering, gently wash the exit site with soap and water. Pat it dry.  Watch for signs of infection, such as: ? Fever or chills. ? Swollen glands under the arm. ? More redness, swelling, or soreness in the arm. ? Blood, fluid, or pus coming from the exit site. ? Warmth or a bad smell at the exit site. ? A red streak spreading away from the exit site. General instructions  Take over-the-counter and prescription medicines only as told by your health care provider. Do not take any new medicines without checking with your health care provider first.  If you were prescribed an antibiotic medicine, apply or take it as told by your health care provider. Do not stop using the antibiotic even if your condition improves.  Keep all follow-up visits as told by your health care provider. This is important. Contact a health care provider if:  You have a fever or chills.  You have soreness, redness, or swelling on your exit site, and it gets worse.  You have swollen glands under your arm.  You have any of the following symptoms at your exit site: ? Blood, fluid, or pus. ? Unusual warmth. ? A bad smell. ? A red streak spreading away from the exit site. Get help right away if:  You have numbness or tingling in your fingers, hand, or arm.  Your arm looks blue and feels cold.  You have signs of an air embolism, such  as: ? Difficulty breathing. ? Chest pain. ? Coughing or wheezing. ? Skin that is pale, blue, cold, or clammy. ? Rapid pulse. ? Rapid breathing. ? Fainting. These symptoms may represent a serious problem that is an emergency. Do not wait to see if the symptoms will go away. Get medical help right away. Call your local emergency services (911 in the [place] Do not drive yourself to the hospital. Summary  After your procedure, it is common to have tenderness or soreness, redness, swelling, or a scab at the exit site.  Keep the dressing over the exit site clean and dry.  Do not remove the dressing until your health care provider tells you to do so.  Do not lift anything heavy or do activities that require great effort until your health care provider says it is okay.  Watch closely for any signs of an air embolism. If you have signs of air embolism, call 911 immediately and lie down on your left side. This information is not intended to replace advice given to you by your health care provider. Make sure you discuss any questions you have with your health care provider. Document Released: 05/11/2013 Document Revised: 04/18/2017 Document Reviewed: 07/02/2016 Elsevier Patient Education  2020 [employer].

## 2019-03-26 NOTE — Progress Notes (Signed)
PICC line pulled per order.  35cm observed on catheter.  Pt tolerated well.  Vaseline gauze/pressure dressing applied.  Instructions given to patient.  Pt observed post procedure.  VSS

## 2019-03-28 NOTE — Progress Notes (Signed)
[place]   Telephone:(336) [phone] Fax:(336) [phone]   Clinic Follow up Note   Patient Care Team: Harrison Mons, PA as PCP - General (Family Medicine) Jovita Kussmaul, MD as Consulting Physician (General Surgery) Ileana Roup, MD as Consulting Physician (General Surgery) Kyung Rudd, MD as Consulting Physician (Radiation Oncology) Truitt Merle, MD as Consulting Physician (Hematology) Arna Snipe, RN as Oncology Nurse Navigator 03/29/2019  CHIEF COMPLAINT: F/u squamous cell carcinoma of rectum   SUMMARY OF ONCOLOGIC HISTORY: Oncology History Overview Note  Cancer Staging Squamous cell carcinoma of rectum [facility]) Staging form: Colon and Rectum, AJCC 8th Edition - Clinical stage from 02/04/2019: Stage IIIA (cT2, cN1a, cM0) - Signed by Truitt Merle, MD on 02/04/2019     Malignant neoplasm of anus ([place])  01/13/2019 Initial Diagnosis   Squamous cell carcinoma of rectum ([place])   01/14/2019 Imaging   CT Pelvis W Contrast 01/14/19 IMPRESSION: Perirectal abscess, at the left posterior aspect of the rectum, inseparable from the wall of the colon and the adjacent pelvic musculature. Largest diameter on cranial caudal imaging measures 4.8 cm.   Asymmetric thickening of the rectum associated with the abscess. A perforated rectal carcinoma is favored given the appearance, and correlation with physical exam as well anoscopy/rigid sigmoidoscopy. Alternatively, the changes may reflect chronic inflammation in the setting of inflammatory bowel disease.   There are several small lymph nodes within the perirectal fat and along the left pelvis, the largest measuring 13 mm, potentially pathologic in the setting of carcinoma, or alternatively reactive.   01/15/2019 Initial Diagnosis   Diagnosis 01/15/19 Soft tissue, abscess, rectal wall - INVASIVE SQUAMOUS CELL CARCINOMA, MODERATELY TO POORLY DIFFERENTIATED.   02/03/2019 Imaging   CT Chest W Contrast 02/03/19  IMPRESSION: 1.  Two nodules each measuring about 4 mm in long axis noted in the right upper lobe. One of these is subpleural. These are likely benign but given the context, surveillance imaging in 6-12 months time should be considered. Fleischner criteria for follow do not directly apply given the history of malignancy.   02/04/2019 Cancer Staging   Staging form: Colon and Rectum, AJCC 8th Edition - Clinical stage from 02/04/2019: Stage IIIA (cT2, cN1a, cM0) - Signed by Truitt Merle, MD on 02/04/2019   02/15/2019 PET scan   IMPRESSION: 1. Hypermetabolic eccentric rectal wall thickening with hypermetabolic left perirectal adenopathy. No evidence of distant metastatic disease. 2. Possible gallbladder sludge. 3. Left renal stone.   02/22/2019 -  Chemotherapy   ChemoRT with Mitomycin/5FU week 1 and 5 starting 02/22/19   02/22/2019 -  Radiation Therapy   ChemoRT with Dr. Sondra Come starting 02/22/19     CURRENT THERAPY: ChemoRT with 5FU and mitomycin starting 02/22/19  INTERVAL HISTORY: Ms. Pinkus returns for f/u as scheduled. She completed day 28 5FU/mitomycin from 11/2 - 11/6.  Her PICC line was pulled, no pain or swelling at the site. She reports severe mostly 10/10 pelvic/perineal pain. She has peeling and drainage. She can not sit or sleep comfortably. Tramadol makes her drowsy but doesn't manage the pain. Activity is limited due to pain. She uses warm water and vaseline, other topicals made side effects worse. She has 7-8 small loose BMs per day with some blood after "forceful" BM, normal for her on treatment. She does not try to stop it with imodium unless it's "really bad." Denies n/v. She eats well despite loss of appetite and stays hydrated. Denies mucositis. Has some dyspnea on exertion related to fatigue. No fever, chills, cough,  chest pain, leg swelling, or neuropathy. She gets outside for an hour few times per week. Her mood is improved this week, fewer "breakdowns" lately.    MEDICAL HISTORY:  Past  Medical History:  Diagnosis Date  . Herpes simplex disciform keratitis 10/20/2013  . Hypertension   . IBD (inflammatory bowel disease) - colitis 01/11/2018  . Nuclear sclerosis of both eyes 11/30/2014  . Squamous cell carcinoma of rectum ([place]) 01/13/2019  . UC (ulcerative colitis) ([place])     SURGICAL HISTORY: Past Surgical History:  Procedure Laterality Date  . ABDOMINAL HYSTERECTOMY    . BREAST CYST ASPIRATION Left 1983  . COLONOSCOPY    . INCISION AND DRAINAGE PERIRECTAL ABSCESS N/A 01/15/2019   Procedure: INTERNAL DRAINAGE OF PERIRECTAL ABSCESS;  Surgeon: Jovita Kussmaul, MD;  Location: [facility];  Service: General;  Laterality: N/A;  . TUBAL LIGATION      I have reviewed the social history and family history with the patient and they are unchanged from previous note.  ALLERGIES:  has No Known Allergies.  MEDICATIONS:  Current Outpatient Medications  Medication Sig Dispense Refill  . aspirin 81 MG tablet Take 81 mg by mouth daily.    . ferrous sulfate 325 (65 FE) MG EC tablet Take 650 mg by mouth daily with breakfast.     . lisinopril-hydrochlorothiazide (PRINZIDE,ZESTORETIC) 20-25 MG tablet Take 1 tablet by mouth daily. 90 tablet 3  . mesalamine (LIALDA) 1.2 g EC tablet TAKE 2 TABLETS (2.4 G TOTAL) BY MOUTH DAILY WITH BREAKFAST. 180 tablet 3  . Multiple Vitamins-Calcium (ONE-A-DAY WOMENS PO) Take 1 tablet by mouth daily.    . naproxen sodium (ALEVE) 220 MG tablet Take 220 mg by mouth daily as needed (pain).     . ondansetron (ZOFRAN) 8 MG tablet Take 1 tablet (8 mg total) by mouth 2 (two) times daily as needed (Nausea or vomiting). 30 tablet 1  . prochlorperazine (COMPAZINE) 10 MG tablet Take 1 tablet (10 mg total) by mouth every 6 (six) hours as needed (Nausea or vomiting). 30 tablet 1  . traMADol (ULTRAM) 50 MG tablet Take 1-2 tablets (50-100 mg total) by mouth every 6 (six) hours as needed. 60 tablet 0  . oxyCODONE (OXY IR/ROXICODONE) 5 MG immediate release tablet Take 1-2 tablets  (5-10 mg total) by mouth every 6 (six) hours as needed for severe pain. 30 tablet 0   No current facility-administered medications for this visit.     PHYSICAL EXAMINATION: ECOG PERFORMANCE STATUS: 2 - Symptomatic, <50% confined to bed  Vitals:   03/29/19 0929  BP: 117/84  Pulse: 94  Resp: 17  Temp: 98.7 F (37.1 C)  SpO2: 97%   Filed Weights   03/29/19 0929  Weight: 139 lb (63 kg)    GENERAL:alert, no distress and comfortable SKIN: no rash   EYES: sclera clear OROPHARYNX: no thrush or ulcers LUNGS: clear with normal breathing effort HEART: regular rate & rhythm, no lower extremity edema ABDOMEN:abdomen soft, non-tender and normal bowel sounds RECTAL: moderate to severe ulceration and moist desquamation  NEURO: alert & oriented x 3 with fluent speech RUE PICC site healing, no drainage or swelling   LABORATORY DATA:  I have reviewed the data as listed CBC Latest Ref Rng & Units 03/29/2019 03/22/2019 03/15/2019  WBC 4.0 - 10.5 K/uL 1.9(L) 4.6 2.9(L)  Hemoglobin 12.0 - 15.0 g/dL 8.4(L) 8.6(L) 9.2(L)  Hematocrit 36.0 - 46.0 % 26.0(L) 26.9(L) 28.8(L)  Platelets 150 - 400 K/uL 198 242 243  CMP Latest Ref Rng & Units 03/29/2019 03/22/2019 03/15/2019  Glucose 70 - 99 mg/dL 131(H) 99 100(H)  BUN 8 - 23 mg/dL 9 8 10   Creatinine 0.44 - 1.00 mg/dL 0.74 0.69 0.75  Sodium 135 - 145 mmol/L 140 141 140  Potassium 3.5 - 5.1 mmol/L 3.8 3.6 3.9  Chloride 98 - 111 mmol/L 106 104 104  CO2 22 - 32 mmol/L 24 28 25   Calcium 8.9 - 10.3 mg/dL 9.0 8.9 9.3  Total Protein 6.5 - 8.1 g/dL 7.1 6.7 7.0  Total Bilirubin 0.3 - 1.2 mg/dL 0.2(L) <0.2(L) <0.2(L)  Alkaline Phos 38 - 126 U/L 61 63 75  AST 15 - 41 U/L 13(L) 11(L) 10(L)  ALT 0 - 44 U/L 12 10 12       RADIOGRAPHIC STUDIES: I have personally reviewed the radiological images as listed and agreed with the findings in the report. No results found.   ASSESSMENT & PLAN: Auburn Bodifordis a 63 y.o.African Americanfemalewith a  history of HTN, IBD, ulcerative colitis  1.Squamous cell carcinoma of rectum -diagnosed 12/2018. Her CT scans shows low rectal wall thickeningwith abcessand surrounding lymphadenopathy, largest up to 47m. Chest scan also shows 2 tiny 460mright lung nodules which are likely benign.  -PET scan showed hypermetabolic rectal wall thickening with hypermetabolic left perirectal adenopathy, consistent with locally advanced disease. No distant metastasis. -She began concurrent chemoRT with 5FU/mitomycin and radiation on 10/5 -she tolerated moderately well with nausea, GERD, and mucositis after week 1, she responded well to symptom management -she has been doing well in the interim while continuing RT. On 11/2 she appeared more tired and emotional, side effects are overwhelming her. [place]. Her mood is improved today. She has good attitude and is determined to finish.  -s/p day 28 mitomycin and 5FU on 11/2 -Ms. Rappleye appears stable today, she tolerated chemo moderately well overall with stable fatigue and diarrhea. Continues to have 7-8 small loose stools daily, which is unchanged on treatment. I encouraged her to consider imodium BID to slow BMs  -she has worsening skin breakdown on exam and severe pain, tramadol is not effective. I recommend to start oxycodone 1-2 tabs q6 PRN for severe pain. We reviewed side effects of constipation, drowsiness. I educated her not to drive.  -we reviewed pain regimen and goal to treat her pain effectively and to wean off opioids as soon as possible, I reviewed addictive nature of this medication. She is aware and understands -Labs reviewed, WBC 1.9, ANC 1.6 we reviewed neutropenic precautions. Hgb stable 8.4, no transfusion needed  -will repeat labs on 11/12 this week -f/u next week   2. Anemia, GI Bleeding -Per patient she has had chronic mildly low anemia. She has been on oral ferrous sulfate.  -This has  worsened lately secondary to her cancer and GI bleeding -she is tolerating oral ironevery other day -She has worsening anemia lately secondary to chemo, and increased fatigue which is likely related to anemia and chemoRT -Hgb 8.4 today -she is more fatigued, but can be functional with effort.  -Will monitor labs closely and transfuse if needed   3. Rectal Pain, perirectal burning and itching -Secondary to #1 -takes2Tramadolat night which was initially effectivebut not adequate now -hydrocortisone did not help itching; I discussed with PA AlShona Simpsonho recommends dermaplast spray -she can try oral benadryl PRN at night to help her sleep -she has been using topical benadryl cream after treatments which has  helped significantly -Her pain and skin breakdown worsened during week 4, but not every day; she tried vaseline, coconut oil, and A&D ointment -I reviewed using multiple agents may worsen her skin breakdown, she will discuss with RT what to use -today her exam reveals moderate to severe skin breakdown, she only uses vaseline topically, she is not interested in other agents.  -tramadol is no longer effective pain management, will try oxycodone   4. Ulcerative Colitis/IBD, Diverticulosis  -Found incidentally on 12/2017 screening colonoscopy  -On Lialda given by Dr Redmond Baseman he recommends to continue as it primarily acts locally at the mucosal level in the colon, no significant systemic absorption.  5. HTN -On Lisinopril, will continue -she takes BP med every other day, BPstable  6. Hypercalcemia -Given prior bone density years ago she waspreviouslyon oral calcium which she stoppedrecently but she does take women's one a day vitamin with calciumwhich is on hold this for now -PTH related peptideand intact PTH are normal,her hypercalcemiais likely relatedto her cancer. -Ifcalcium>12, will consider zometa -Ca remains normal lately   7. Nausea, GERD,  mucositis -secondary to chemotherapy -improved with symptom management medication  PLAN:  -Labs reviewed, ANC 1.6, Hgb 8.4 -Reviewed neutropenic precautions -Add lab 11/12 to monitor counts and updated Bryson Ha, [place] in rad onc -Consider imodium BID PRN for diarrhea -Rx: oxycodone 5 mg 1-2 tabs q6 PRN for severe pain -Begin ensure/boost for weight loss  -F/u next week with lab    Orders Placed This Encounter  Procedures  . Sample to Blood Bank    Standing Status:   Future    Number of Occurrences:   1    Standing Expiration Date:   03/28/2020   All questions were answered. The patient knows to call the clinic with any problems, questions or concerns. No barriers to learning was detected. I spent 20 minutes counseling the patient face to face. The total time spent in the appointment was 25 minutes and more than 50% was on counseling and review of test results     Alla Feeling, NP 03/29/19

## 2019-03-29 ENCOUNTER — Inpatient Hospital Stay (HOSPITAL_BASED_OUTPATIENT_CLINIC_OR_DEPARTMENT_OTHER): Payer: BC Managed Care – PPO | Admitting: Nurse Practitioner

## 2019-03-29 ENCOUNTER — Other Ambulatory Visit: Payer: Self-pay

## 2019-03-29 ENCOUNTER — Ambulatory Visit
Admission: RE | Admit: 2019-03-29 | Discharge: 2019-03-29 | Disposition: A | Discharge status: Home / Self Care | Payer: BC Managed Care – PPO | Source: Ambulatory Visit | Attending: Radiation Oncology | Admitting: Radiation Oncology

## 2019-03-29 ENCOUNTER — Encounter: Payer: Self-pay | Admitting: Nurse Practitioner

## 2019-03-29 ENCOUNTER — Telehealth: Payer: Self-pay | Admitting: Nurse Practitioner

## 2019-03-29 ENCOUNTER — Inpatient Hospital Stay: Payer: BC Managed Care – PPO

## 2019-03-29 VITALS — BP 117/84 | HR 94 | Temp 98.7°F | Resp 17 | Ht 65.0 in | Wt 139.0 lb

## 2019-03-29 DIAGNOSIS — C20 Malignant neoplasm of rectum: Secondary | ICD-10-CM

## 2019-03-29 DIAGNOSIS — Z5111 Encounter for antineoplastic chemotherapy: Secondary | ICD-10-CM | POA: Diagnosis not present

## 2019-03-29 DIAGNOSIS — D649 Anemia, unspecified: Secondary | ICD-10-CM

## 2019-03-29 LAB — FERRITIN: Ferritin: 581 ng/mL — ABNORMAL HIGH (ref 11–307)

## 2019-03-29 LAB — SAMPLE TO BLOOD BANK: Blood Bank Specimen: O POS

## 2019-03-29 LAB — CMP (CANCER CENTER ONLY)
ALT: 12 U/L (ref 0–44)
AST: 13 U/L — ABNORMAL LOW (ref 15–41)
Albumin: 3.4 g/dL — ABNORMAL LOW (ref 3.5–5.0)
Alkaline Phosphatase: 61 U/L (ref 38–126)
Anion gap: 10 (ref 5–15)
BUN: 9 mg/dL (ref 8–23)
CO2: 24 mmol/L (ref 22–32)
Calcium: 9 mg/dL (ref 8.9–10.3)
Chloride: 106 mmol/L (ref 98–111)
Creatinine: 0.74 mg/dL (ref 0.44–1.00)
GFR, Est AFR Am: >60 mL/min (ref >60)
GFR, Estimated: >60 mL/min (ref >60)
Glucose, Bld: 131 mg/dL — ABNORMAL HIGH (ref 70–99)
Potassium: 3.8 mmol/L (ref 3.5–5.1)
Sodium: 140 mmol/L (ref 135–145)
Total Bilirubin: 0.2 mg/dL — ABNORMAL LOW (ref 0.3–1.2)
Total Protein: 7.1 g/dL (ref 6.5–8.1)

## 2019-03-29 LAB — CBC WITH DIFFERENTIAL (CANCER CENTER ONLY)
Abs Immature Granulocytes: 0.02 10*3/uL (ref 0.00–0.07)
Basophils Absolute: 0 10*3/uL (ref 0.0–0.1)
Basophils Relative: 0 %
Eosinophils Absolute: 0.2 10*3/uL (ref 0.0–0.5)
Eosinophils Relative: 8 %
HCT: 26 % — ABNORMAL LOW (ref 36.0–46.0)
Hemoglobin: 8.4 g/dL — ABNORMAL LOW (ref 12.0–15.0)
Immature Granulocytes: 1 %
Lymphocytes Relative: 2 %
Lymphs Abs: 0 10*3/uL — ABNORMAL LOW (ref 0.7–4.0)
MCH: 28.5 pg (ref 26.0–34.0)
MCHC: 32.3 g/dL (ref 30.0–36.0)
MCV: 88.1 fL (ref 80.0–100.0)
Monocytes Absolute: 0.1 10*3/uL (ref 0.1–1.0)
Monocytes Relative: 5 %
Neutro Abs: 1.6 10*3/uL — ABNORMAL LOW (ref 1.7–7.7)
Neutrophils Relative %: 84 %
Platelet Count: 198 10*3/uL (ref 150–400)
RBC: 2.95 MIL/uL — ABNORMAL LOW (ref 3.87–5.11)
RDW: 14.9 % (ref 11.5–15.5)
WBC Count: 1.9 10*3/uL — ABNORMAL LOW (ref 4.0–10.5)
nRBC: 0 % (ref 0.0–0.2)

## 2019-03-29 MED ORDER — OXYCODONE HCL 5 MG PO TABS: 5.0000 mg | ORAL_TABLET | Freq: Four times a day (QID) | ORAL | 0 refills | Status: DC | PRN

## 2019-03-29 NOTE — Telephone Encounter (Signed)
Scheduled appt per 11/9 los.  Spoke with pt and they are aware of the appt date and time.

## 2019-03-30 ENCOUNTER — Ambulatory Visit
Admission: RE | Admit: 2019-03-30 | Discharge: 2019-03-30 | Disposition: A | Discharge status: Home / Self Care | Payer: BC Managed Care – PPO | Source: Ambulatory Visit | Attending: Radiation Oncology | Admitting: Radiation Oncology

## 2019-03-30 ENCOUNTER — Other Ambulatory Visit: Payer: Self-pay

## 2019-03-30 DIAGNOSIS — C20 Malignant neoplasm of rectum: Secondary | ICD-10-CM | POA: Diagnosis not present

## 2019-03-31 ENCOUNTER — Ambulatory Visit: Payer: BC Managed Care – PPO

## 2019-03-31 ENCOUNTER — Other Ambulatory Visit: Payer: Self-pay

## 2019-03-31 ENCOUNTER — Ambulatory Visit
Admission: RE | Admit: 2019-03-31 | Discharge: 2019-03-31 | Disposition: A | Discharge status: Home / Self Care | Payer: BC Managed Care – PPO | Source: Ambulatory Visit | Attending: Radiation Oncology | Admitting: Radiation Oncology

## 2019-03-31 DIAGNOSIS — C20 Malignant neoplasm of rectum: Secondary | ICD-10-CM | POA: Diagnosis not present

## 2019-03-31 NOTE — Progress Notes (Signed)
[place]   Telephone:(336) [phone] Fax:(336) [phone]   Clinic Follow up Note   Patient Care Team: Harrison Mons, PA as PCP - General (Family Medicine) Jovita Kussmaul, MD as Consulting Physician (General Surgery) Ileana Roup, MD as Consulting Physician (General Surgery) Kyung Rudd, MD as Consulting Physician (Radiation Oncology) Truitt Merle, MD as Consulting Physician (Hematology) Arna Snipe, RN as Oncology Nurse Navigator  Date of Service:  04/05/2019  CHIEF COMPLAINT: F/u rectal cancer  SUMMARY OF ONCOLOGIC HISTORY: Oncology History Overview Note  Cancer Staging Squamous cell carcinoma of rectum [facility]) Staging form: Colon and Rectum, AJCC 8th Edition - Clinical stage from 02/04/2019: Stage IIIA (cT2, cN1a, cM0) - Signed by Truitt Merle, MD on 02/04/2019     Malignant neoplasm of anus ([place])  01/13/2019 Initial Diagnosis   Squamous cell carcinoma of rectum ([employer])   01/14/2019 Imaging   CT Pelvis W Contrast 01/14/19 IMPRESSION: Perirectal abscess, at the left posterior aspect of the rectum, inseparable from the wall of the colon and the adjacent pelvic musculature. Largest diameter on cranial caudal imaging measures 4.8 cm.   Asymmetric thickening of the rectum associated with the abscess. A perforated rectal carcinoma is favored given the appearance, and correlation with physical exam as well anoscopy/rigid sigmoidoscopy. Alternatively, the changes may reflect chronic inflammation in the setting of inflammatory bowel disease.   There are several small lymph nodes within the perirectal fat and along the left pelvis, the largest measuring 13 mm, potentially pathologic in the setting of carcinoma, or alternatively reactive.   01/15/2019 Initial Diagnosis   Diagnosis 01/15/19 Soft tissue, abscess, rectal wall - INVASIVE SQUAMOUS CELL CARCINOMA, MODERATELY TO POORLY DIFFERENTIATED.   02/03/2019 Imaging   CT Chest W Contrast 02/03/19  IMPRESSION: 1.  Two nodules each measuring about 4 mm in long axis noted in the right upper lobe. One of these is subpleural. These are likely benign but given the context, surveillance imaging in 6-12 months time should be considered. Fleischner criteria for follow do not directly apply given the history of malignancy.   02/04/2019 Cancer Staging   Staging form: Colon and Rectum, AJCC 8th Edition - Clinical stage from 02/04/2019: Stage IIIA (cT2, cN1a, cM0) - Signed by Truitt Merle, MD on 02/04/2019   02/15/2019 PET scan   IMPRESSION: 1. Hypermetabolic eccentric rectal wall thickening with hypermetabolic left perirectal adenopathy. No evidence of distant metastatic disease. 2. Possible gallbladder sludge. 3. Left renal stone.   02/22/2019 - 03/22/2019 Chemotherapy   ChemoRT with Mitomycin/5FU week 1 and 5 starting 02/22/19 and ending 03/22/19   02/22/2019 - 04/06/2019 Radiation Therapy   ChemoRT with Dr. Sondra Come 02/22/19-04/06/19      CURRENT THERAPY:  ChemoRT with Mitomycin/5FU week 1 and 5 starting10/5/20-11/13/20  INTERVAL HISTORY:  Leah Bailey is here for a follow up. She presents to the clinic alone. She finished RT last week, main concern is pain, she was seen by rad/onc last week, and oxycodone was increased to 2 to 3 tablets every 4-6 hours, she has been taking 8-2 tabs tablets daily.  He rpain overall is slightly better than last week  Vaginal burning and pain, with clear discharge  Dysuria since this morning, no frequency or fever  Very fatigue, but able to function at home, no dizziness  Rest ROS negative   MEDICAL HISTORY:  Past Medical History:  Diagnosis Date  . Herpes simplex disciform keratitis 10/20/2013  . Hypertension   . IBD (inflammatory bowel disease) - colitis 01/11/2018  . Nuclear  sclerosis of both eyes 11/30/2014  . Squamous cell carcinoma of rectum ([place]) 01/13/2019  . UC (ulcerative colitis) ([place])     SURGICAL HISTORY: Past Surgical History:  Procedure Laterality Date   . ABDOMINAL HYSTERECTOMY    . BREAST CYST ASPIRATION Left 1983  . COLONOSCOPY    . INCISION AND DRAINAGE PERIRECTAL ABSCESS N/A 01/15/2019   Procedure: INTERNAL DRAINAGE OF PERIRECTAL ABSCESS;  Surgeon: Jovita Kussmaul, MD;  Location: [facility];  Service: General;  Laterality: N/A;  . TUBAL LIGATION      I have reviewed the social history and family history with the patient and they are unchanged from previous note.  ALLERGIES:  has No Known Allergies.  MEDICATIONS:  Current Outpatient Medications  Medication Sig Dispense Refill  . aspirin 81 MG tablet Take 81 mg by mouth daily.    . ferrous sulfate 325 (65 FE) MG EC tablet Take 650 mg by mouth daily with breakfast.     . lisinopril-hydrochlorothiazide (PRINZIDE,ZESTORETIC) 20-25 MG tablet Take 1 tablet by mouth daily. 90 tablet 3  . mesalamine (LIALDA) 1.2 g EC tablet TAKE 2 TABLETS (2.4 G TOTAL) BY MOUTH DAILY WITH BREAKFAST. 180 tablet 3  . Multiple Vitamins-Calcium (ONE-A-DAY WOMENS PO) Take 1 tablet by mouth daily.    . naproxen sodium (ALEVE) 220 MG tablet Take 220 mg by mouth daily as needed (pain).     . ondansetron (ZOFRAN) 8 MG tablet Take 1 tablet (8 mg total) by mouth 2 (two) times daily as needed (Nausea or vomiting). 30 tablet 1  . oxyCODONE (OXY IR/ROXICODONE) 5 MG immediate release tablet Take 1-2 tablets (5-10 mg total) by mouth every 6 (six) hours as needed for severe pain. 30 tablet 0  . oxyCODONE (OXY IR/ROXICODONE) 5 MG immediate release tablet Take 3 tablets (15 mg total) by mouth every 4 (four) hours as needed for severe pain. 120 tablet 0  . prochlorperazine (COMPAZINE) 10 MG tablet Take 1 tablet (10 mg total) by mouth every 6 (six) hours as needed (Nausea or vomiting). 30 tablet 1  . traMADol (ULTRAM) 50 MG tablet Take 1-2 tablets (50-100 mg total) by mouth every 6 (six) hours as needed. 60 tablet 0   No current facility-administered medications for this visit.     PHYSICAL EXAMINATION: ECOG PERFORMANCE STATUS: 2 -  Symptomatic, <50% confined to bed  Vitals:   04/05/19 1144  BP: (!) 87/62  Pulse: 92  Resp: 16  Temp: 99.1 F (37.3 C)  SpO2: 100%   Filed Weights   04/05/19 1144  Weight: 141 lb 1.6 oz (64 kg)   GENERAL:alert, no distress and comfortable SKIN: skin color, texture, turgor are normal, no rashes or significant lesions EYES: normal, Conjunctiva are pink and non-injected, sclera clear NECK: supple, thyroid normal size, non-tender, without nodularity LYMPH:  no palpable lymphadenopathy in the cervical, axillary  LUNGS: clear to auscultation and percussion with normal breathing effort HEART: regular rate & rhythm and no murmurs and no lower extremity edema ABDOMEN:abdomen soft, non-tender and normal bowel sounds Perirectal area showed shallow skin ulcers with discharge, and small area of skin peeling at left inner thigh  Musculoskeletal:no cyanosis of digits and no clubbing  NEURO: alert & oriented x 3 with fluent speech, no focal motor/sensory deficits  LABORATORY DATA:  I have reviewed the data as listed CBC Latest Ref Rng & Units 04/05/2019 04/01/2019 03/29/2019  WBC 4.0 - 10.5 K/uL 1.8(L) 1.5(L) 1.9(L)  Hemoglobin 12.0 - 15.0 g/dL 8.5(L) 8.5(L) 8.4(L)  Hematocrit  36.0 - 46.0 % 25.9(L) 26.4(L) 26.0(L)  Platelets 150 - 400 K/uL 232 266 198     CMP Latest Ref Rng & Units 04/05/2019 04/01/2019 03/29/2019  Glucose 70 - 99 mg/dL 166(H) 129(H) 131(H)  BUN 8 - 23 mg/dL 8 7(L) 9  Creatinine 0.44 - 1.00 mg/dL 0.91 0.73 0.74  Sodium 135 - 145 mmol/L 139 141 140  Potassium 3.5 - 5.1 mmol/L 3.1(L) 3.5 3.8  Chloride 98 - 111 mmol/L 99 102 106  CO2 22 - 32 mmol/L 29 28 24   Calcium 8.9 - 10.3 mg/dL 9.1 9.3 9.0  Total Protein 6.5 - 8.1 g/dL 7.1 7.4 7.1  Total Bilirubin 0.3 - 1.2 mg/dL <0.2(L) 0.2(L) 0.2(L)  Alkaline Phos 38 - 126 U/L 69 67 61  AST 15 - 41 U/L 13(L) 12(L) 13(L)  ALT 0 - 44 U/L 12 12 12       RADIOGRAPHIC STUDIES: I have personally reviewed the radiological images as  listed and agreed with the findings in the report. No results found.   ASSESSMENT & PLAN:  Leah Bailey is a 63 y.o. female with   1.Squamous cell carcinoma of rectum, Ct2N1aM0 stage IIIA -She was diagnosed in 12/2018. Her CT scans shows low rectal wall thickeningwith abscessand surrounding lymphadenopathy, largest up to 63m. Chest scan also shows 2 tiny 464mright lung nodules which are likely benign. PET shows no distant metastasis.  -she has completed concurrent chemoRT with Mitomycin and 5FU M-F on week 1 and 5 last week.  She overall tolerated moderately well. -she has developed severe perianal pain and skin toxicities, she is on oxycodone 10 mg 4-5 times a day  -We discussed the recovery course, I encouraged her to drink more fluids, try to eat more, and keep the perianal area clean, and use Vaseline -She will continue oxycodone as needed for pain control, I encouraged her to gradually reduce her dose, and try to wean off in the next 3-4 weeks.  We discussed narcotics dependence, she is agreeable. -Lab reviewed, mild neutropenia from chemotherapy.  We reviewed neutropenic fever precaution -Follow-up in 2 weeks  2. Anemia, GI Bleeding -Per patient she has had chronic mildly low anemia. She has been on Leah ferrous sulfate.  -This has worsened lately secondary to her cancer and GI bleeding -She is symptomatic with SOB and fatigue.  -Stable overall   3. Rectal Pain  -Secondary to #1 and chemo RT -She will continue oxycodone 5 to 10 mg every 6 hours, as needed, I encouraged her to gradually decrease in the next 2 to 4 weeks   4. Ulcerative Colitis/IBD, Diverticulosis  -Found incidentally on 12/2017 screening colonoscopy  -On Lialda given by Dr GeCarlean Purl-Continue Prilosec  5. HTN -On Lisinopril-HCTZ, BP slightly low, she is asymptomatic, I told her to stop medication -I offered her IVF today, she declined  -she will monitor her BP at home    PLAN: -lab  reviewed with pt -lab and f/u in 2 weeks -continue supportive care and pain management    No problem-specific Assessment & Plan notes found for this encounter.   No orders of the defined types were placed in this encounter.  All questions were answered. The patient knows to call the clinic with any problems, questions or concerns. No barriers to learning was detected. I spent 15 minutes counseling the patient face to face. The total time spent in the appointment was 20 minutes and more than 50% was on counseling and review of test results  Truitt Merle, MD 04/05/2019   I, Joslyn Devon, am acting as scribe for Truitt Merle, MD.   I have reviewed the above documentation for accuracy and completeness, and I agree with the above.

## 2019-04-01 ENCOUNTER — Inpatient Hospital Stay: Payer: BC Managed Care – PPO

## 2019-04-01 ENCOUNTER — Encounter: Payer: Self-pay | Admitting: Radiation Oncology

## 2019-04-01 ENCOUNTER — Other Ambulatory Visit: Payer: Self-pay | Admitting: Radiation Oncology

## 2019-04-01 ENCOUNTER — Ambulatory Visit
Admission: RE | Admit: 2019-04-01 | Discharge: 2019-04-01 | Disposition: A | Discharge status: Home / Self Care | Payer: BC Managed Care – PPO | Source: Ambulatory Visit | Attending: Radiation Oncology | Admitting: Radiation Oncology

## 2019-04-01 ENCOUNTER — Other Ambulatory Visit: Payer: Self-pay

## 2019-04-01 ENCOUNTER — Telehealth: Payer: Self-pay | Admitting: Emergency Medicine

## 2019-04-01 DIAGNOSIS — C20 Malignant neoplasm of rectum: Secondary | ICD-10-CM | POA: Diagnosis not present

## 2019-04-01 DIAGNOSIS — Z5111 Encounter for antineoplastic chemotherapy: Secondary | ICD-10-CM | POA: Diagnosis not present

## 2019-04-01 LAB — CMP (CANCER CENTER ONLY)
ALT: 12 U/L (ref 0–44)
AST: 12 U/L — ABNORMAL LOW (ref 15–41)
Albumin: 3.4 g/dL — ABNORMAL LOW (ref 3.5–5.0)
Alkaline Phosphatase: 67 U/L (ref 38–126)
Anion gap: 11 (ref 5–15)
BUN: 7 mg/dL — ABNORMAL LOW (ref 8–23)
CO2: 28 mmol/L (ref 22–32)
Calcium: 9.3 mg/dL (ref 8.9–10.3)
Chloride: 102 mmol/L (ref 98–111)
Creatinine: 0.73 mg/dL (ref 0.44–1.00)
GFR, Est AFR Am: >60 mL/min (ref >60)
GFR, Estimated: >60 mL/min (ref >60)
Glucose, Bld: 129 mg/dL — ABNORMAL HIGH (ref 70–99)
Potassium: 3.5 mmol/L (ref 3.5–5.1)
Sodium: 141 mmol/L (ref 135–145)
Total Bilirubin: 0.2 mg/dL — ABNORMAL LOW (ref 0.3–1.2)
Total Protein: 7.4 g/dL (ref 6.5–8.1)

## 2019-04-01 LAB — CBC WITH DIFFERENTIAL (CANCER CENTER ONLY)
Abs Immature Granulocytes: 0 10*3/uL (ref 0.00–0.07)
Basophils Absolute: 0 10*3/uL (ref 0.0–0.1)
Basophils Relative: 0 %
Eosinophils Absolute: 0.1 10*3/uL (ref 0.0–0.5)
Eosinophils Relative: 7 %
HCT: 26.4 % — ABNORMAL LOW (ref 36.0–46.0)
Hemoglobin: 8.5 g/dL — ABNORMAL LOW (ref 12.0–15.0)
Immature Granulocytes: 0 %
Lymphocytes Relative: 4 %
Lymphs Abs: 0.1 10*3/uL — ABNORMAL LOW (ref 0.7–4.0)
MCH: 27.9 pg (ref 26.0–34.0)
MCHC: 32.2 g/dL (ref 30.0–36.0)
MCV: 86.6 fL (ref 80.0–100.0)
Monocytes Absolute: 0.2 10*3/uL (ref 0.1–1.0)
Monocytes Relative: 13 %
Neutro Abs: 1.1 10*3/uL — ABNORMAL LOW (ref 1.7–7.7)
Neutrophils Relative %: 76 %
Platelet Count: 266 10*3/uL (ref 150–400)
RBC: 3.05 MIL/uL — ABNORMAL LOW (ref 3.87–5.11)
RDW: 15 % (ref 11.5–15.5)
WBC Count: 1.5 10*3/uL — ABNORMAL LOW (ref 4.0–10.5)
nRBC: 0 % (ref 0.0–0.2)

## 2019-04-01 MED ORDER — OXYCODONE HCL 5 MG PO TABS: 15.0000 mg | ORAL_TABLET | ORAL | 0 refills | Status: DC | PRN

## 2019-04-01 NOTE — Progress Notes (Signed)
The patient was seen today following her 29/30 fractions to the anus. She has had terrible pain in the anal and gluteal regions. She has been taking oxycodone 5 mg every 6 hours, and started taking 10 mg every 6 hours earlier in the week. She took 3 tablets at a time every 6 hours yesterday and the only noted minimal pain improvement.  She has been using Vaseline on her skin to serve as a barrier.  She is also been applying Neosporin.  After reviewing her labs, her counts continue to be appropriate for treatment.  On exam this is a tearful appearing African-American female in no acute distress.  She is alert and oriented x4 and appropriate throughout the examination.Cardiopulmonary assessment is negative for acute distress and she exhibits normal effort.  Evaluation of the perineum and gluteal region reveals moist desquamation along the right gluteal fold greater than left.  There is fibrin noted between the tissues, and cell islands are present.  No superinfection is identified no visible tumor is noted.  Silvadene was applied to the patient's skin after confirming that she does not have any allergies to sulfa.  We discussed applying sulfa twice daily today and tomorrow, she will see Dr. Lisbeth Renshaw tomorrow as well for her undertreat visit, which should conclude her sessions.  For some reason she is still scheduled into Tuesday of next week which we will clarify with Dr. Lisbeth Renshaw.  We discussed increasing her frequency and dose of oxycodone and a new prescription was sent to her pharmacy for oxycodone 15 mg tablets 1 every 4-6 hours as needed pain.  Hopefully she will only need this for the next week or 2, and then can taper down to the 5 mg tablets, I told her that by 4 weeks post treatment I would expect that she would not need pain medication at all.  She is to contact us if she needs any additional Silvadene as well which would have to be prescribed at that point she states agreement understanding.      Carola Rhine, PAC
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
## 2019-04-02 ENCOUNTER — Ambulatory Visit: Payer: BC Managed Care – PPO

## 2019-04-02 ENCOUNTER — Encounter: Payer: Self-pay | Admitting: Radiation Oncology

## 2019-04-02 ENCOUNTER — Other Ambulatory Visit: Payer: Self-pay

## 2019-04-02 DIAGNOSIS — C20 Malignant neoplasm of rectum: Secondary | ICD-10-CM | POA: Diagnosis not present

## 2019-04-05 ENCOUNTER — Other Ambulatory Visit: Payer: Self-pay

## 2019-04-05 ENCOUNTER — Encounter: Payer: Self-pay | Admitting: Hematology

## 2019-04-05 ENCOUNTER — Inpatient Hospital Stay: Payer: BC Managed Care – PPO | Admitting: Hematology

## 2019-04-05 ENCOUNTER — Ambulatory Visit: Payer: BC Managed Care – PPO

## 2019-04-05 ENCOUNTER — Telehealth: Payer: Self-pay | Admitting: Hematology

## 2019-04-05 ENCOUNTER — Inpatient Hospital Stay: Payer: BC Managed Care – PPO

## 2019-04-05 VITALS — BP 87/62 | HR 92 | Temp 99.1°F | Resp 16 | Ht 65.0 in | Wt 141.1 lb

## 2019-04-05 DIAGNOSIS — Z5111 Encounter for antineoplastic chemotherapy: Secondary | ICD-10-CM | POA: Diagnosis not present

## 2019-04-05 DIAGNOSIS — C210 Malignant neoplasm of anus, unspecified: Secondary | ICD-10-CM

## 2019-04-05 DIAGNOSIS — C20 Malignant neoplasm of rectum: Secondary | ICD-10-CM

## 2019-04-05 DIAGNOSIS — I10 Essential (primary) hypertension: Secondary | ICD-10-CM

## 2019-04-05 DIAGNOSIS — D649 Anemia, unspecified: Secondary | ICD-10-CM

## 2019-04-05 LAB — CBC WITH DIFFERENTIAL (CANCER CENTER ONLY)
Abs Immature Granulocytes: 0.01 10*3/uL (ref 0.00–0.07)
Basophils Absolute: 0 10*3/uL (ref 0.0–0.1)
Basophils Relative: 1 %
Eosinophils Absolute: 0.1 10*3/uL (ref 0.0–0.5)
Eosinophils Relative: 3 %
HCT: 25.9 % — ABNORMAL LOW (ref 36.0–46.0)
Hemoglobin: 8.5 g/dL — ABNORMAL LOW (ref 12.0–15.0)
Immature Granulocytes: 1 %
Lymphocytes Relative: 4 %
Lymphs Abs: 0.1 10*3/uL — ABNORMAL LOW (ref 0.7–4.0)
MCH: 28.9 pg (ref 26.0–34.0)
MCHC: 32.8 g/dL (ref 30.0–36.0)
MCV: 88.1 fL (ref 80.0–100.0)
Monocytes Absolute: 0.2 10*3/uL (ref 0.1–1.0)
Monocytes Relative: 13 %
Neutro Abs: 1.4 10*3/uL — ABNORMAL LOW (ref 1.7–7.7)
Neutrophils Relative %: 78 %
Platelet Count: 232 10*3/uL (ref 150–400)
RBC: 2.94 MIL/uL — ABNORMAL LOW (ref 3.87–5.11)
RDW: 15.5 % (ref 11.5–15.5)
WBC Count: 1.8 10*3/uL — ABNORMAL LOW (ref 4.0–10.5)
nRBC: 0 % (ref 0.0–0.2)

## 2019-04-05 LAB — CMP (CANCER CENTER ONLY)
ALT: 12 U/L (ref 0–44)
AST: 13 U/L — ABNORMAL LOW (ref 15–41)
Albumin: 3.2 g/dL — ABNORMAL LOW (ref 3.5–5.0)
Alkaline Phosphatase: 69 U/L (ref 38–126)
Anion gap: 11 (ref 5–15)
BUN: 8 mg/dL (ref 8–23)
CO2: 29 mmol/L (ref 22–32)
Calcium: 9.1 mg/dL (ref 8.9–10.3)
Chloride: 99 mmol/L (ref 98–111)
Creatinine: 0.91 mg/dL (ref 0.44–1.00)
GFR, Est AFR Am: >60 mL/min (ref >60)
GFR, Estimated: >60 mL/min (ref >60)
Glucose, Bld: 166 mg/dL — ABNORMAL HIGH (ref 70–99)
Potassium: 3.1 mmol/L — ABNORMAL LOW (ref 3.5–5.1)
Sodium: 139 mmol/L (ref 135–145)
Total Bilirubin: <0.2 mg/dL — ABNORMAL LOW (ref 0.3–1.2)
Total Protein: 7.1 g/dL (ref 6.5–8.1)

## 2019-04-05 LAB — SAMPLE TO BLOOD BANK

## 2019-04-05 NOTE — Telephone Encounter (Signed)
Scheduled appt per 11/16 los.  Spoke with pt and she is aware of the appt date and time.

## 2019-04-06 ENCOUNTER — Ambulatory Visit: Payer: BC Managed Care – PPO

## 2019-04-08 ENCOUNTER — Encounter: Payer: Self-pay | Admitting: General Practice

## 2019-04-08 NOTE — Progress Notes (Signed)
[facility]ual Care Note  Phoned Ms San as planned, leaving voicemail of care and encouragement. Also encouraged callback.   [place], [place], [facility] Pager [phone] Voicemail [phone]

## 2019-04-18 NOTE — Progress Notes (Signed)
[facility]   Telephone:([phone]) [phone] Fax:([phone]) [phone]   Clinic Follow up Note   Patient Care Team: Harrison Mons, PA as PCP - General (Family Medicine) Jovita Kussmaul, MD as Consulting Physician (General Surgery) Ileana Roup, MD as Consulting Physician (General Surgery) Kyung Rudd, MD as Consulting Physician (Radiation Oncology) Truitt Merle, MD as Consulting Physician (Hematology) Arna Snipe, RN as Oncology Nurse Navigator 04/19/2019  CHIEF COMPLAINT: F/u rectal cancer   SUMMARY OF ONCOLOGIC HISTORY: Oncology History Overview Note  Cancer Staging Squamous cell carcinoma of rectum [facility]) Staging form: Colon and Rectum, AJCC 8th Edition - Clinical stage from 02/04/2019: Stage IIIA (cT2, cN1a, cM0) - Signed by Truitt Merle, MD on 02/04/2019     Malignant neoplasm of anus ([place])  01/13/2019 Initial Diagnosis   Squamous cell carcinoma of rectum ([place])   01/14/2019 Imaging   CT Pelvis W Contrast 01/14/19 IMPRESSION: Perirectal abscess, at the left posterior aspect of the rectum, inseparable from the wall of the colon and the adjacent pelvic musculature. Largest diameter on cranial caudal imaging measures 4.8 cm.   Asymmetric thickening of the rectum associated with the abscess. A perforated rectal carcinoma is favored given the appearance, and correlation with physical exam as well anoscopy/rigid sigmoidoscopy. Alternatively, the changes may reflect chronic inflammation in the setting of inflammatory bowel disease.   There are several small lymph nodes within the perirectal fat and along the left pelvis, the largest measuring 13 mm, potentially pathologic in the setting of carcinoma, or alternatively reactive.   01/15/2019 Initial Diagnosis   Diagnosis 01/15/19 Soft tissue, abscess, rectal wall - INVASIVE SQUAMOUS CELL CARCINOMA, MODERATELY TO POORLY DIFFERENTIATED.   02/03/2019 Imaging   CT Chest W Contrast 02/03/19  IMPRESSION: 1. Two nodules each  measuring about 4 mm in long axis noted in the right upper lobe. One of these is subpleural. These are likely benign but given the context, surveillance imaging in 6-12 months time should be considered. Fleischner criteria for follow do not directly apply given the history of malignancy.   02/04/2019 Cancer Staging   Staging form: Colon and Rectum, AJCC 8th Edition - Clinical stage from 02/04/2019: Stage IIIA (cT2, cN1a, cM0) - Signed by Truitt Merle, MD on 02/04/2019   02/15/2019 PET scan   IMPRESSION: 1. Hypermetabolic eccentric rectal wall thickening with hypermetabolic left perirectal adenopathy. No evidence of distant metastatic disease. 2. Possible gallbladder sludge. 3. Left renal stone.   02/22/2019 - 03/22/2019 Chemotherapy   ChemoRT with Mitomycin/5FU week 1 and 5 starting 02/22/19 and ending 03/22/19   02/22/2019 - 04/06/2019 Radiation Therapy   ChemoRT with Dr. Sondra Come 02/22/19-04/06/19     CURRENT THERAPY:  1. ChemoRT with Mitomycin/5FUweek 1 and 5starting10/5/20-11/13/20 2. Supportive care   INTERVAL HISTORY: Ms. Cellucci returns for f/u as scheduled. She completed RT on 11/13 and was seen on 11/16 by Dr. Burr Medico. She is feeling much better lately with more energy. Still has mild dyspnea on exertion. Her pain is improved also, takes oxycodone 1-2 times daily mostly at night. Skin is healing, applies A&D. Bowels are regulating, continues stool softener. No blood in stool. Denies n/v. She has some burning on urination, no urgency or frequency. She has some vaginal dryness, uses Ky. Denies recent fever, chills, cough, chest pain.     MEDICAL HISTORY:  Past Medical History:  Diagnosis Date  . Herpes simplex disciform keratitis 10/20/2013  . Hypertension   . IBD (inflammatory bowel disease) - colitis 01/11/2018  . Nuclear sclerosis of both eyes  11/30/2014  . Squamous cell carcinoma of rectum ([place]) 01/13/2019  . UC (ulcerative colitis) ([place])     SURGICAL HISTORY: Past Surgical  History:  Procedure Laterality Date  . ABDOMINAL HYSTERECTOMY    . BREAST CYST ASPIRATION Left 1983  . COLONOSCOPY    . INCISION AND DRAINAGE PERIRECTAL ABSCESS N/A 01/15/2019   Procedure: INTERNAL DRAINAGE OF PERIRECTAL ABSCESS;  Surgeon: Jovita Kussmaul, MD;  Location: [facility];  Service: General;  Laterality: N/A;  . TUBAL LIGATION      I have reviewed the social history and family history with the patient and they are unchanged from previous note.  ALLERGIES:  has No Known Allergies.  MEDICATIONS:  Current Outpatient Medications  Medication Sig Dispense Refill  . aspirin 81 MG tablet Take 81 mg by mouth daily.    . ferrous sulfate 325 (65 FE) MG EC tablet Take 650 mg by mouth daily with breakfast.     . lisinopril-hydrochlorothiazide (PRINZIDE,ZESTORETIC) 20-25 MG tablet Take 1 tablet by mouth daily. 90 tablet 3  . mesalamine (LIALDA) 1.2 g EC tablet TAKE 2 TABLETS (2.4 G TOTAL) BY MOUTH DAILY WITH BREAKFAST. 180 tablet 3  . Multiple Vitamins-Calcium (ONE-A-DAY WOMENS PO) Take 1 tablet by mouth daily.    . naproxen sodium (ALEVE) 220 MG tablet Take 220 mg by mouth daily as needed (pain).     . ondansetron (ZOFRAN) 8 MG tablet Take 1 tablet (8 mg total) by mouth 2 (two) times daily as needed (Nausea or vomiting). 30 tablet 1  . oxyCODONE (OXY IR/ROXICODONE) 5 MG immediate release tablet Take 1 tablet (5 mg total) by mouth every 6 (six) hours as needed for severe pain. 30 tablet 0  . prochlorperazine (COMPAZINE) 10 MG tablet Take 1 tablet (10 mg total) by mouth every 6 (six) hours as needed (Nausea or vomiting). 30 tablet 1  . traMADol (ULTRAM) 50 MG tablet Take 1-2 tablets (50-100 mg total) by mouth every 6 (six) hours as needed. 60 tablet 0   No current facility-administered medications for this visit.     PHYSICAL EXAMINATION: ECOG PERFORMANCE STATUS: 1 - Symptomatic but completely ambulatory  Vitals:   04/19/19 1537 04/19/19 1546  BP: (!) 142/93 137/87  Pulse: (!) 102 96   Resp: 17   Temp: 98.5 F (36.9 C)   SpO2: 100%    Filed Weights   04/19/19 1537  Weight: 143 lb 4.8 oz (65 kg)    GENERAL:alert, no distress and comfortable SKIN: no rash  EYES: sclera clear LYMPH:  no palpable cervical or supraclavicular lymphadenopathy LUNGS: clear with normal breathing effort HEART: regular rate & rhythm, no lower extremity edema ABDOMEN: abdomen soft, non-tender and normal bowel sounds PELVIC: external exam shows hyperpigmentation and shallow skin ulcers in the perirectal area  NEURO: alert & oriented x 3 with fluent speech  LABORATORY DATA:  I have reviewed the data as listed CBC Latest Ref Rng & Units 04/19/2019 04/05/2019 04/01/2019  WBC 4.0 - 10.5 K/uL 2.8(L) 1.8(L) 1.5(L)  Hemoglobin 12.0 - 15.0 g/dL 8.0(L) 8.5(L) 8.5(L)  Hematocrit 36.0 - 46.0 % 26.0(L) 25.9(L) 26.4(L)  Platelets 150 - 400 K/uL 265 232 266     CMP Latest Ref Rng & Units 04/19/2019 04/05/2019 04/01/2019  Glucose 70 - 99 mg/dL 140(H) 166(H) 129(H)  BUN 8 - 23 mg/dL 9 8 7(L)  Creatinine 0.44 - 1.00 mg/dL 0.73 0.91 0.73  Sodium 135 - 145 mmol/L 143 139 141  Potassium 3.5 - 5.1 mmol/L 3.8 3.1(L) 3.5  Chloride 98 - 111 mmol/L 109 99 102  CO2 22 - 32 mmol/L 24 29 28   Calcium 8.9 - 10.3 mg/dL 8.9 9.1 9.3  Total Protein 6.5 - 8.1 g/dL 6.8 7.1 7.4  Total Bilirubin 0.3 - 1.2 mg/dL <0.2(L) <0.2(L) 0.2(L)  Alkaline Phos 38 - 126 U/L 73 69 67  AST 15 - 41 U/L 27 13(L) 12(L)  ALT 0 - 44 U/L 44 12 12      RADIOGRAPHIC STUDIES: I have personally reviewed the radiological images as listed and agreed with the findings in the report. No results found.   ASSESSMENT & PLAN: Claudett Bodifordis a 63 y.o.African Americanfemalewith a history of HTN, IBD, ulcerative colitis  1.Squamous cell carcinoma of rectum -diagnosed 12/2018. Her CT scans shows low rectal wall thickeningwith abcessand surrounding lymphadenopathy, largest up to 59m. Chest scan also shows 2 tiny 447mright lung  nodules which are likely benign.  -PET scan showed no distant metastasis. -She completed chemoRT with 5FU/mitomycin and radiation from 10/5 to 04/02/19 -she tolerated moderately well but developed severe skin toxicity secondary to radiation, she required oxycodone 15 mg PRN for pain management -Ms. Giddings appears well today, her pain has much improved, down to oxycodone 5 mg 1-2 times daily  -her performance status has also improved. She is recovering well  -we reviewed pain medication, and I again encouraged her to wean off as soon as possible. She has 1 tab left, still uses 1-2 times daily. I refilled for her today, I do not plan to refill oxycodone. We discussed transition to tramadol or tylenol soon, she agrees.  -labs reviewed, ANC improving. Hgb 8.0 today, although she is less symptomatic. Only mild DOE, her fatigue improved. I recommend to observe for now, she will let me know if she becomes more symptomatic. CMP unremarkable.  -she will continue to recover, phone visit in 3 weeks    2. Anemia, GI Bleeding -Per patient she has had chronic mildly low anemia. She has been on oral ferrous sulfate.  -This has worsened lately secondary to her cancer and GI bleeding -she is tolerating oral ironevery other day -Hgb 8.0, she is mildly symptomatic with DOE. Fatigue improved, she is out of bed and active at home. Will observe for now  3. Rectal Pain, perirectal burning and itching -Secondary to #1 -worsened on treatment, she required up to 15 mg oxycodone PRN for pain management -pain is improving, she has been able to wean to oxy 5 mg 1-2 times daily PRN -continue to wean off  4. Ulcerative Colitis/IBD, Diverticulosis  -Found incidentally on 12/2017 screening colonoscopy  -On Lialda given by Dr GeRedmond Basemane recommends to continue as it primarily acts locally at the mucosal level in the colon, no significant systemic absorption.  5. HTN -On Lisinopril, will continue -she  developed hypotension on treatment and stopped lisinopril -BP gradually increasing, in the 140's when she checks at home -I recommend to restart lisinopril if SBP consistently >140 at home, she understands  6. Hypercalcemia -was on daily vitamin and calcium before treatment -PTH related peptideand intact PTH are normal,her hypercalcemiais likely relatedto her cancer. -Ifcalcium>12, will consider zometa -Caremains normal lately -resolved  7. Nausea, GERD, mucositis -secondary to chemotherapy -resolved with symptom management medication  8. Dysuria  -she has vaginal dryness and burning on urination, no urgency or frequency -will check UA this week to r/o UTI, the lab is closed today -If UA is negative, this is likely radiation cystitis  -she continues to hydrate well  and drinks cranberry   PLAN: -Labs reviewed -Continue symptom management for skin toxicity and rectal pain -Refilled oxycodone today, I do not plan to refill  -UA this week -Phone visit in 3 weeks   All questions were answered. The patient knows to call the clinic with any problems, questions or concerns. No barriers to learning was detected.     Alla Feeling, NP 04/19/19

## 2019-04-19 ENCOUNTER — Encounter: Payer: Self-pay | Admitting: Nurse Practitioner

## 2019-04-19 ENCOUNTER — Inpatient Hospital Stay: Payer: BC Managed Care – PPO

## 2019-04-19 ENCOUNTER — Inpatient Hospital Stay (HOSPITAL_BASED_OUTPATIENT_CLINIC_OR_DEPARTMENT_OTHER): Payer: BC Managed Care – PPO | Admitting: Nurse Practitioner

## 2019-04-19 ENCOUNTER — Other Ambulatory Visit: Payer: Self-pay

## 2019-04-19 VITALS — BP 137/87 | HR 96 | Temp 98.5°F | Resp 17 | Ht 65.0 in | Wt 143.3 lb

## 2019-04-19 DIAGNOSIS — R300 Dysuria: Secondary | ICD-10-CM

## 2019-04-19 DIAGNOSIS — Z5111 Encounter for antineoplastic chemotherapy: Secondary | ICD-10-CM | POA: Diagnosis not present

## 2019-04-19 DIAGNOSIS — C20 Malignant neoplasm of rectum: Secondary | ICD-10-CM

## 2019-04-19 DIAGNOSIS — C210 Malignant neoplasm of anus, unspecified: Secondary | ICD-10-CM

## 2019-04-19 DIAGNOSIS — D649 Anemia, unspecified: Secondary | ICD-10-CM

## 2019-04-19 LAB — CMP (CANCER CENTER ONLY)
ALT: 44 U/L (ref 0–44)
AST: 27 U/L (ref 15–41)
Albumin: 3.4 g/dL — ABNORMAL LOW (ref 3.5–5.0)
Alkaline Phosphatase: 73 U/L (ref 38–126)
Anion gap: 10 (ref 5–15)
BUN: 9 mg/dL (ref 8–23)
CO2: 24 mmol/L (ref 22–32)
Calcium: 8.9 mg/dL (ref 8.9–10.3)
Chloride: 109 mmol/L (ref 98–111)
Creatinine: 0.73 mg/dL (ref 0.44–1.00)
GFR, Est AFR Am: >60 mL/min (ref >60)
GFR, Estimated: >60 mL/min (ref >60)
Glucose, Bld: 140 mg/dL — ABNORMAL HIGH (ref 70–99)
Potassium: 3.8 mmol/L (ref 3.5–5.1)
Sodium: 143 mmol/L (ref 135–145)
Total Bilirubin: <0.2 mg/dL — ABNORMAL LOW (ref 0.3–1.2)
Total Protein: 6.8 g/dL (ref 6.5–8.1)

## 2019-04-19 LAB — CBC WITH DIFFERENTIAL (CANCER CENTER ONLY)
Abs Immature Granulocytes: 0.02 10*3/uL (ref 0.00–0.07)
Basophils Absolute: 0 10*3/uL (ref 0.0–0.1)
Basophils Relative: 1 %
Eosinophils Absolute: 0 10*3/uL (ref 0.0–0.5)
Eosinophils Relative: 1 %
HCT: 26 % — ABNORMAL LOW (ref 36.0–46.0)
Hemoglobin: 8 g/dL — ABNORMAL LOW (ref 12.0–15.0)
Immature Granulocytes: 1 %
Lymphocytes Relative: 18 %
Lymphs Abs: 0.5 10*3/uL — ABNORMAL LOW (ref 0.7–4.0)
MCH: 28.4 pg (ref 26.0–34.0)
MCHC: 30.8 g/dL (ref 30.0–36.0)
MCV: 92.2 fL (ref 80.0–100.0)
Monocytes Absolute: 0.3 10*3/uL (ref 0.1–1.0)
Monocytes Relative: 10 %
Neutro Abs: 1.9 10*3/uL (ref 1.7–7.7)
Neutrophils Relative %: 69 %
Platelet Count: 265 10*3/uL (ref 150–400)
RBC: 2.82 MIL/uL — ABNORMAL LOW (ref 3.87–5.11)
RDW: 18.4 % — ABNORMAL HIGH (ref 11.5–15.5)
WBC Count: 2.8 10*3/uL — ABNORMAL LOW (ref 4.0–10.5)
nRBC: 0 % (ref 0.0–0.2)

## 2019-04-19 MED ORDER — OXYCODONE HCL 5 MG PO TABS: 5.0000 mg | ORAL_TABLET | Freq: Four times a day (QID) | ORAL | 0 refills | Status: DC | PRN

## 2019-04-20 ENCOUNTER — Telehealth: Payer: Self-pay | Admitting: Nurse Practitioner

## 2019-04-20 NOTE — Telephone Encounter (Signed)
Scheduled appt per 11/30 los.  Spoke with pt and she is aware of the appt date and time.

## 2019-04-21 ENCOUNTER — Inpatient Hospital Stay: Payer: BC Managed Care – PPO | Attending: Hematology

## 2019-04-21 ENCOUNTER — Ambulatory Visit
Admission: RE | Admit: 2019-04-21 | Discharge: 2019-04-21 | Disposition: A | Discharge status: Home / Self Care | Payer: BC Managed Care – PPO | Source: Ambulatory Visit | Attending: Physician Assistant | Admitting: Physician Assistant

## 2019-04-21 ENCOUNTER — Other Ambulatory Visit: Payer: Self-pay | Admitting: Nurse Practitioner

## 2019-04-21 ENCOUNTER — Other Ambulatory Visit: Payer: Self-pay

## 2019-04-21 DIAGNOSIS — K5190 Ulcerative colitis, unspecified, without complications: Secondary | ICD-10-CM | POA: Diagnosis not present

## 2019-04-21 DIAGNOSIS — D649 Anemia, unspecified: Secondary | ICD-10-CM | POA: Insufficient documentation

## 2019-04-21 DIAGNOSIS — K219 Gastro-esophageal reflux disease without esophagitis: Secondary | ICD-10-CM | POA: Insufficient documentation

## 2019-04-21 DIAGNOSIS — Z1231 Encounter for screening mammogram for malignant neoplasm of breast: Secondary | ICD-10-CM

## 2019-04-21 DIAGNOSIS — R300 Dysuria: Secondary | ICD-10-CM

## 2019-04-21 DIAGNOSIS — I10 Essential (primary) hypertension: Secondary | ICD-10-CM | POA: Diagnosis not present

## 2019-04-21 DIAGNOSIS — Z85048 Personal history of other malignant neoplasm of rectum, rectosigmoid junction, and anus: Secondary | ICD-10-CM | POA: Insufficient documentation

## 2019-04-21 LAB — URINALYSIS, COMPLETE (UACMP) WITH MICROSCOPIC
Bilirubin Urine: NEGATIVE
Glucose, UA: NEGATIVE mg/dL
Hgb urine dipstick: NEGATIVE
Ketones, ur: NEGATIVE mg/dL
Nitrite: NEGATIVE
Protein, ur: 30 mg/dL — AB
Specific Gravity, Urine: 1.023 (ref 1.005–1.030)
pH: 5 (ref 5.0–8.0)

## 2019-04-21 MED ORDER — CIPROFLOXACIN HCL 500 MG PO TABS: 500.0000 mg | ORAL_TABLET | Freq: Two times a day (BID) | ORAL | 0 refills | Status: DC

## 2019-04-22 ENCOUNTER — Telehealth: Payer: Self-pay | Admitting: *Deleted

## 2019-04-22 LAB — URINE CULTURE: Culture: NO GROWTH

## 2019-04-22 NOTE — Telephone Encounter (Signed)
Per Cira Rue, NP, called and made pt aware that her UA shows evidence of UTI. She will start on Cipro 500 mg BID x3 days. Advised that culture and if sensitive to Cipro, she may need a different antibiotic. Pt verbalized understanding.

## 2019-04-22 NOTE — Telephone Encounter (Signed)
-----   Message from Alla Feeling, NP sent at 04/21/2019  4:46 PM EST ----- Please let her know UA shows evidence of UTI. I will start her on Cipro 500 mg BID x3 days. Please monitor her culture and if not sensitive to Cipro, she will need different antibiotic.  Thanks, Regan Rakers

## 2019-04-27 ENCOUNTER — Telehealth: Payer: Self-pay | Admitting: *Deleted

## 2019-04-27 NOTE — Telephone Encounter (Signed)
Called pt & informed that urine culture was negative. She reports that she is feeling good now & no symptoms of UTI.  She was encouraged to cont to increase her oral fluids. She states that she is working on this.

## 2019-04-27 NOTE — Telephone Encounter (Signed)
-----   Message from Alla Feeling, NP sent at 04/27/2019  8:04 AM EST ----- Please let her know urine culture was ultimately negative and evaluate her symptoms. If she still has burning it's likely related to radiation and should improve as she heals. Encourage her to stay hydrated.  Thanks, lacie

## 2019-05-03 ENCOUNTER — Telehealth: Payer: Self-pay | Admitting: Radiation Oncology

## 2019-05-03 DIAGNOSIS — C210 Malignant neoplasm of anus, unspecified: Secondary | ICD-10-CM

## 2019-05-03 NOTE — Telephone Encounter (Signed)
  Radiation Oncology         [phone] ________________________________  Name: Leah Bailey MRN: [id number]  Date of Service: 05/03/2019  DOB: 05-06-1956  Post Treatment Telephone Note  Diagnosis:    Stage IIIA, cT2N1aM0 moderate to poorly differentiated squamous cell carcinoma of the rectum/anus  Interval Since Last Radiation: 5 weeks   02/22/2019-04/02/2019: The patient's anus and tumor in the rectum and regional nodes were treated to 54 Gy in 30 fractions.   Narrative:  The patient was contacted today for routine follow-up. During treatment she did very well with radiotherapy and did not have significant desquamation. She reports she is doing very well and is back to normal bowel and bladder function. She states her vaginal opening is slightly dry and tighter than prior to treatment..  Impression/Plan: 1.  Stage IIIA, cT2N1aM0 moderate to poorly differentiated squamous cell carcinoma of the rectum/anus. The patient has been doing well since completion of radiotherapy. We discussed that we would be happy to continue to follow her for GYN care needs as well in one year's time, but she will also continue to follow up with Dr. Burr Medico in medical oncology and Dr. Dema Severin for anoscopic surveillance. She is in agreement.  2. GYN/Breast Care. We discussed a referral to Earlie Counts, PT and the role of vaginal dilators. I also recommended she use coconut oil for a lubricant/vaginal moisturizer. She has misplaced her set so we will put another set at the front desk for her. She will continue her breast screening as well annually at the [facility].     Carola Rhine, PAC

## 2019-05-09 NOTE — Progress Notes (Signed)
[facility]   [phone] [phone]   Clinic Follow up Note   Patient Care Team: Harrison Mons, [place] as PCP - General (Family Medicine) Jovita Kussmaul, MD as Consulting Physician (General Surgery) Ileana Roup, MD as Consulting Physician (General Surgery) Kyung Rudd, MD as Consulting Physician ([place]) Truitt Merle, MD as Consulting Physician (Hematology) Arna Snipe, RN (Inactive) as Oncology Nurse Navigator 05/10/2019   I connected with Leah Bailey on 05/10/19 at  3:15 PM EST by telephone visit and verified that I am speaking with the correct person using two identifiers.   I discussed the limitations, risks, security and privacy concerns of performing an evaluation and management service by telemedicine and the availability of in-person appointments. I also discussed with the patient that there may be a patient responsible charge related to this service. The patient expressed understanding and agreed to proceed.   Other persons participating in the visit and their role in the encounter: none  Patient's location: home Provider's location: [place] office   CHIEF COMPLAINT: F/u squamous cell carcinoma of the rectum   SUMMARY OF ONCOLOGIC HISTORY: Oncology History Overview Note  Cancer Staging Squamous cell carcinoma of rectum ([place]) Staging form: Colon and Rectum, AJCC 8th Edition - Clinical stage from 02/04/2019: Stage IIIA (cT2, cN1a, cM0) - Signed by Truitt Merle, MD on 02/04/2019     Malignant neoplasm of anus ([place])  01/13/2019 Initial Diagnosis   Squamous cell carcinoma of rectum ([place])   01/14/2019 Imaging   CT Pelvis W Contrast 01/14/19 IMPRESSION: Perirectal abscess, at the left posterior aspect of the rectum, inseparable from the wall of the colon and the adjacent pelvic musculature. Largest diameter on cranial caudal imaging measures 4.8 cm.   Asymmetric thickening of the rectum associated with the abscess.  A perforated rectal carcinoma is favored given the appearance, and correlation with physical exam as well anoscopy/rigid sigmoidoscopy. Alternatively, the changes may reflect chronic inflammation in the setting of inflammatory bowel disease.   There are several small lymph nodes within the perirectal fat and along the left pelvis, the largest measuring 13 mm, potentially pathologic in the setting of carcinoma, or alternatively reactive.   01/15/2019 Initial Diagnosis   Diagnosis 01/15/19 Soft tissue, abscess, rectal wall - INVASIVE SQUAMOUS CELL CARCINOMA, MODERATELY TO POORLY DIFFERENTIATED.   02/03/2019 Imaging   CT Chest W Contrast 02/03/19  IMPRESSION: 1. Two nodules each measuring about 4 mm in long axis noted in the right upper lobe. One of these is subpleural. These are likely benign but given the context, surveillance imaging in 6-12 months time should be considered. Fleischner criteria for follow do not directly apply given the history of malignancy.   02/04/2019 Cancer Staging   Staging form: Colon and Rectum, AJCC 8th Edition - Clinical stage from 02/04/2019: Stage IIIA (cT2, cN1a, cM0) - Signed by Truitt Merle, MD on 02/04/2019   02/15/2019 PET scan   IMPRESSION: 1. Hypermetabolic eccentric rectal wall thickening with hypermetabolic left perirectal adenopathy. No evidence of distant metastatic disease. 2. Possible gallbladder sludge. 3. Left renal stone.   02/22/2019 - 03/22/2019 Chemotherapy   ChemoRT with Mitomycin/5FU week 1 and 5 starting 02/22/19 and ending 03/22/19   02/22/2019 - 04/06/2019 Radiation Therapy   ChemoRT with Dr. Sondra Come 02/22/19-04/06/19     CURRENT THERAPY: S/p chemo RT with 5FU/mitomycin completed on 04/01/19  INTERVAL HISTORY: Ms. Higby presents for phone f/u. She was last seen on 04/19/19. She was treated for UTI, but culture was ultimately negative.  She is feeling very well. Still gets tired easily, but improving. Remains out of bed and functional.  Appetite is normal. BMs are regular. Denies rectal bleeding. She saw Dr. Dema Severin recently and she tolerated rectal exam, she notes he "didn't feel anything but a tag." Skin is still discolored but improving. No pain with BM. Denies n/v. No residual side effects from chemo except fatigue. Once a week she might feel a sharp rectal pain but doesn't last long. She is off pain meds. She had vaginal dryness and was told to use coconut oil. She is hesitant to try intercourse due to vaginal pain, she is starting PT for this and has talked to rad onc about using dilator.     MEDICAL HISTORY:  Past Medical History:  Diagnosis Date  . Herpes simplex disciform keratitis 10/20/2013  . Hypertension   . IBD (inflammatory bowel disease) - colitis 01/11/2018  . Nuclear sclerosis of both eyes 11/30/2014  . Squamous cell carcinoma of rectum ([place]) 01/13/2019  . UC (ulcerative colitis) ([place])     SURGICAL HISTORY: Past Surgical History:  Procedure Laterality Date  . ABDOMINAL HYSTERECTOMY    . BREAST CYST ASPIRATION Left 1983  . BREAST LUMPECTOMY Left   . COLONOSCOPY    . INCISION AND DRAINAGE PERIRECTAL ABSCESS N/A 01/15/2019   Procedure: INTERNAL DRAINAGE OF PERIRECTAL ABSCESS;  Surgeon: Jovita Kussmaul, MD;  Location: [facility];  Service: General;  Laterality: N/A;  . TUBAL LIGATION      I have reviewed the social history and family history with the patient and they are unchanged from previous note.  ALLERGIES:  has No Known Allergies.  MEDICATIONS:  Current Outpatient Medications  Medication Sig Dispense Refill  . aspirin 81 MG tablet Take 81 mg by mouth daily.    . ciprofloxacin (CIPRO) 500 MG tablet Take 1 tablet (500 mg total) by mouth 2 (two) times daily. 6 tablet 0  . ferrous sulfate 325 (65 FE) MG EC tablet Take 650 mg by mouth daily with breakfast.     . lisinopril-hydrochlorothiazide (PRINZIDE,ZESTORETIC) 20-25 MG tablet Take 1 tablet by mouth daily. 90 tablet 3  . mesalamine (LIALDA) 1.2 g EC tablet  TAKE 2 TABLETS (2.4 G TOTAL) BY MOUTH DAILY WITH BREAKFAST. 180 tablet 3  . Multiple Vitamins-Calcium (ONE-A-DAY WOMENS PO) Take 1 tablet by mouth daily.    . naproxen sodium (ALEVE) 220 MG tablet Take 220 mg by mouth daily as needed (pain).     . ondansetron (ZOFRAN) 8 MG tablet Take 1 tablet (8 mg total) by mouth 2 (two) times daily as needed (Nausea or vomiting). 30 tablet 1  . oxyCODONE (OXY IR/ROXICODONE) 5 MG immediate release tablet Take 1 tablet (5 mg total) by mouth every 6 (six) hours as needed for severe pain. 30 tablet 0  . prochlorperazine (COMPAZINE) 10 MG tablet Take 1 tablet (10 mg total) by mouth every 6 (six) hours as needed (Nausea or vomiting). 30 tablet 1  . traMADol (ULTRAM) 50 MG tablet Take 1-2 tablets (50-100 mg total) by mouth every 6 (six) hours as needed. 60 tablet 0   No current facility-administered medications for this visit.    PHYSICAL EXAMINATION: ECOG PERFORMANCE STATUS: 1 - Symptomatic but completely ambulatory  There were no vitals filed for this visit. There were no vitals filed for this visit.   Patient appears well over the phone. Speech is clear and non-pressured. Mood and affect appear normal. No cough or conversational dyspnea.    LABORATORY DATA:  No labs for this visit.     RADIOGRAPHIC STUDIES: I have personally reviewed the radiological images as listed and agreed with the findings in the report. No results found.   ASSESSMENT & PLAN: Mystie Bodifordis a 63y.o.African Americanfemalewith a history of HTN, IBD, ulcerative colitis  1.Squamous cell carcinoma of rectum -diagnosed 12/2018. Her CT scans shows low rectal wall thickeningwith abcessand surrounding lymphadenopathy, largest up to 32m. Chest scan also shows 2 tiny 490mright lung nodules which are likely benign.  -PET scan showed no distant metastasis. -She completed chemoRT with 5FU/mitomycin and radiation from 10/5 to 04/02/19 -she tolerated moderately well but  developed severe skin toxicity secondary to radiation, she required oxycodone 15 mg PRN for pain management.  -Ms. Berrett appears to be well over the phone. Her pain resolved and she is off opioids. BMs are normal. Skin continues to improve -She was seen by Dr. WhDema Severinoday, his rectal exam indicates excellent clinical response to treatment, no palpable mass  -I recommend for her to come in next week after the holiday to monitor her Hgb and ensure it is recovering -we discussed the f/u plan. She will return to surgeon in 1 month for f/u. We will do phone visit in 6 weeks. We will obtain PET scan in early March to evaluate her response to treatment. She agrees.    2. Anemia, GI Bleeding -Per patient she has had chronic mildly low anemia. She has been on oral ferrous sulfate. This worsened secondary to her cancer and GI bleeding -she is tolerating oral ironevery other day -Hgb 8.0 on 11/30. Her fatigue is improving. She otherwise feels very well -will check labs next week  3. Rectal Pain, perirectal burning and itching -Secondary to #1 -worsened on treatment, she required up to 15 mg oxycodone PRN for pain management -pain resolved, she has been able to wean off completely   4. Ulcerative Colitis/IBD, Diverticulosis  -Found incidentally on 12/2017 screening colonoscopy  -On Lialda given by Dr GeRedmond Basemane recommends to continue as it primarily acts locally at the mucosal level in the colon, no significant systemic absorption.  5. HTN -On Lisinopril, will continue -she developed hypotension on treatment and stopped lisinopril -she monitors BP at home, I recommended to restart lisinopril if SBP consistently >140 at home, she understands  6. Hypercalcemia -was on daily vitamin and calcium before treatment -PTH related peptideand intact PTH are normal,her hypercalcemiais likely relatedto her cancer. -Ifcalcium>12, will consider zometa -Caremains normal  lately -resolved  7. Nausea, GERD, mucositis -secondary to chemotherapy -resolved with symptom management medication -resolved  8. Dysuria  -on 11/30 she had vaginal dryness and burning on urination, no urgency or frequency -UA showed signs of UTI, she was treated empirically with cipro x3 days. Cultures was ultimately negative.  -symptoms resolved   PLAN: -Labs next week to monitor Hgb -F/u 1 month with Dr. WhDema SeverinPhone visit in 6 weeks, will order PET at that visit   No problem-specific Assessment & Plan notes found for this encounter.   No orders of the defined types were placed in this encounter.  All questions were answered. The patient knows to call the clinic with any problems, questions or concerns. No barriers to learning were detected. I spent 10 minutes in today's non face-to-face encounter and more than 50% was on counseling and review of test results     LaAlla FeelingNP 05/10/19

## 2019-05-10 ENCOUNTER — Inpatient Hospital Stay (HOSPITAL_BASED_OUTPATIENT_CLINIC_OR_DEPARTMENT_OTHER): Payer: BC Managed Care – PPO | Admitting: Nurse Practitioner

## 2019-05-10 ENCOUNTER — Encounter: Payer: Self-pay | Admitting: Nurse Practitioner

## 2019-05-10 DIAGNOSIS — C210 Malignant neoplasm of anus, unspecified: Secondary | ICD-10-CM

## 2019-05-11 ENCOUNTER — Telehealth: Payer: Self-pay | Admitting: Nurse Practitioner

## 2019-05-11 NOTE — Telephone Encounter (Signed)
Scheduled appt per 12/21 los.  Spoke with pt and she is aware of her appt dates and time

## 2019-05-17 ENCOUNTER — Other Ambulatory Visit: Payer: Self-pay

## 2019-05-17 ENCOUNTER — Inpatient Hospital Stay: Payer: BC Managed Care – PPO

## 2019-05-17 DIAGNOSIS — D649 Anemia, unspecified: Secondary | ICD-10-CM

## 2019-05-17 DIAGNOSIS — C20 Malignant neoplasm of rectum: Secondary | ICD-10-CM

## 2019-05-17 DIAGNOSIS — Z85048 Personal history of other malignant neoplasm of rectum, rectosigmoid junction, and anus: Secondary | ICD-10-CM | POA: Diagnosis not present

## 2019-05-17 LAB — CBC WITH DIFFERENTIAL (CANCER CENTER ONLY)
Abs Immature Granulocytes: 0.01 10*3/uL (ref 0.00–0.07)
Basophils Absolute: 0 10*3/uL (ref 0.0–0.1)
Basophils Relative: 0 %
Eosinophils Absolute: 0 10*3/uL (ref 0.0–0.5)
Eosinophils Relative: 1 %
HCT: 32.4 % — ABNORMAL LOW (ref 36.0–46.0)
Hemoglobin: 10.1 g/dL — ABNORMAL LOW (ref 12.0–15.0)
Immature Granulocytes: 0 %
Lymphocytes Relative: 18 %
Lymphs Abs: 0.5 10*3/uL — ABNORMAL LOW (ref 0.7–4.0)
MCH: 29 pg (ref 26.0–34.0)
MCHC: 31.2 g/dL (ref 30.0–36.0)
MCV: 93.1 fL (ref 80.0–100.0)
Monocytes Absolute: 0.3 10*3/uL (ref 0.1–1.0)
Monocytes Relative: 12 %
Neutro Abs: 1.8 10*3/uL (ref 1.7–7.7)
Neutrophils Relative %: 69 %
Platelet Count: 257 10*3/uL (ref 150–400)
RBC: 3.48 MIL/uL — ABNORMAL LOW (ref 3.87–5.11)
RDW: 18 % — ABNORMAL HIGH (ref 11.5–15.5)
WBC Count: 2.7 10*3/uL — ABNORMAL LOW (ref 4.0–10.5)
nRBC: 0 % (ref 0.0–0.2)

## 2019-05-17 LAB — CMP (CANCER CENTER ONLY)
ALT: 51 U/L — ABNORMAL HIGH (ref 0–44)
AST: 32 U/L (ref 15–41)
Albumin: 3.7 g/dL (ref 3.5–5.0)
Alkaline Phosphatase: 73 U/L (ref 38–126)
Anion gap: 9 (ref 5–15)
BUN: 12 mg/dL (ref 8–23)
CO2: 26 mmol/L (ref 22–32)
Calcium: 9 mg/dL (ref 8.9–10.3)
Chloride: 105 mmol/L (ref 98–111)
Creatinine: 0.72 mg/dL (ref 0.44–1.00)
GFR, Est AFR Am: >60 mL/min (ref >60)
GFR, Estimated: >60 mL/min (ref >60)
Glucose, Bld: 87 mg/dL (ref 70–99)
Potassium: 4.3 mmol/L (ref 3.5–5.1)
Sodium: 140 mmol/L (ref 135–145)
Total Bilirubin: <0.2 mg/dL — ABNORMAL LOW (ref 0.3–1.2)
Total Protein: 7 g/dL (ref 6.5–8.1)

## 2019-05-17 LAB — IRON AND TIBC
Iron: 71 ug/dL (ref 41–142)
Saturation Ratios: 28 % (ref 21–57)
TIBC: 256 ug/dL (ref 236–444)
UIBC: 185 ug/dL (ref 120–384)

## 2019-05-17 LAB — FERRITIN: Ferritin: 544 ng/mL — ABNORMAL HIGH (ref 11–307)

## 2019-06-07 ENCOUNTER — Other Ambulatory Visit: Payer: Self-pay

## 2019-06-07 ENCOUNTER — Ambulatory Visit: Payer: BC Managed Care – PPO | Attending: Radiation Oncology | Admitting: Physical Therapy

## 2019-06-07 ENCOUNTER — Encounter: Payer: Self-pay | Admitting: Physical Therapy

## 2019-06-07 DIAGNOSIS — C210 Malignant neoplasm of anus, unspecified: Secondary | ICD-10-CM

## 2019-06-07 DIAGNOSIS — M6281 Muscle weakness (generalized): Secondary | ICD-10-CM | POA: Insufficient documentation

## 2019-06-07 NOTE — Patient Instructions (Addendum)
STRETCHING THE PELVIC FLOOR MUSCLES NO DILATOR  Supplies . Vaginal lubricant . Mirror (optional) . Gloves (optional) Positioning . Start in a semi-reclined position with your head propped up. Bend your knees and place your thumb or finger at the vaginal opening. Procedure . Apply a moderate amount of lubricant on the outer skin of your vagina, the labia minora.  Apply additional lubricant to your finger. Marland Kitchen Spread the skin away from the vaginal opening. Place the end of your finger at the opening. . Do a maximum contraction of the pelvic floor muscles. Tighten the vagina and the anus maximally and relax. . When you know they are relaxed, gently and slowly insert your finger into your vagina, directing your finger slightly downward, for 2-3 inches of insertion. . Relax and stretch the 6 o'clock position . Hold each stretch for _2 min__ and repeat __1_ time with rest breaks of _1__ seconds between each stretch. . Repeat the stretching in the 4 o'clock and 8 o'clock positions. . Then start from the middle and sweep upwards 15 times each side . Total time should be _6__ minutes, _1__ x per day.  Note the amount of theme your were able to achieve and your tolerance to your finger in your vagina. . Once you have accomplished the techniques you may try them in standing with one foot resting on the tub, or in other positions.  This is a good stretch to do in the shower if you don't need to use lubricant.   [facility] [address], [place], [place] [address] Phone # [phone] Fax [phone]

## 2019-06-07 NOTE — Therapy (Signed)
[facility] [address] [place], [place], [address] Phone: [phone]   Fax:  [phone]  Physical Therapy Evaluation  Patient Details  Name: Leah Bailey MRN: [id number] Date of Birth: Jan 08, 1956 Referring Provider (PT): Hayden Pedro, [place]   Encounter Date: 06/07/2019  PT End of Session - 06/07/19 1321    Visit Number  1    Date for PT Re-Evaluation  08/30/19    PT Start Time  3500    PT Stop Time  1310    PT Time Calculation (min)  40 min    Activity Tolerance  Patient tolerated treatment well;No increased pain    Behavior During Therapy  WFL for tasks assessed/performed       Past Medical History:  Diagnosis Date  . Herpes simplex disciform keratitis 10/20/2013  . Hypertension   . IBD (inflammatory bowel disease) - colitis 01/11/2018  . Nuclear sclerosis of both eyes 11/30/2014  . Squamous cell carcinoma of rectum ([place]) 01/13/2019  . UC (ulcerative colitis) [facility])     Past Surgical History:  Procedure Laterality Date  . ABDOMINAL HYSTERECTOMY    . BREAST CYST ASPIRATION Left 1983  . BREAST LUMPECTOMY Left   . COLONOSCOPY    . INCISION AND DRAINAGE PERIRECTAL ABSCESS N/A 01/15/2019   Procedure: INTERNAL DRAINAGE OF PERIRECTAL ABSCESS;  Surgeon: Jovita Kussmaul, MD;  Location: [facility];  Service: General;  Laterality: N/A;  . TUBAL LIGATION      There were no vitals filed for this visit.   Subjective Assessment - 06/07/19 1235    Subjective  Since patient had anal cancer. Patient gets fatique easily. Patient is walking to build up endurance. Patient vaginal opening is not opening. I have used some cocnut oit. I am nervous to aggravate the area. I called to get the dilators.    Pertinent History  anal cancer with radiation and chemotherapy from 02/22/2019-03/27/2019    Patient Stated Goals  expand the vaginal canal    Currently in Pain?  No/denies         [facility] PT Assessment - 06/07/19 0001       Assessment   Medical Diagnosis  C21.0 Malignant Neoplasm of anus    Referring Provider (PT)  Hayden Pedro, PA    Onset Date/Surgical Date  03/27/19    Prior Therapy  none      Precautions   Precautions  Other (comment)    Precaution Comments  anal cancer with chemotherapy and radiation      Restrictions   Weight Bearing Restrictions  No      Balance Screen   Has the patient fallen in the past 6 months  No    Has the patient had a decrease in activity level because of a fear of falling?   No    Is the patient reluctant to leave their home because of a fear of falling?   No      Prior Function   Level of Independence  Independent    Leisure  build up endurance to exercise      Cognition   Overall Cognitive Status  Within Functional Limits for tasks assessed      Posture/Postural Control   Posture/Postural Control  No significant limitations      ROM / Strength   AROM / PROM / Strength  AROM;PROM;Strength      AROM   Lumbar Flexion  decreased by 25%    Lumbar Extension  decreased  by 25%    Lumbar - Right Side Bend  decreased by 25%    Lumbar - Left Side Bend  decreased by 25%    Lumbar - Right Rotation  decreased by 25%    Lumbar - Left Rotation  decreased by 25%      PROM   Right Hip External Rotation   55    Right Hip Internal Rotation   10    Left Hip External Rotation   55    Left Hip Internal Rotation   5      Strength   Right Hip ABduction  4/5    Left Hip Extension  4/5    Left Hip ABduction  3+/5      Palpation   Palpation comment  tenderness located in gluteus                Objective measurements completed on examination: See above findings.    Pelvic Floor Special Questions - 06/07/19 0001    Currently Sexually Active  No   scared due to vaginal tightness   Urinary Leakage  No    Urinary urgency  Yes    Fecal incontinence  No    Skin Integrity  Intact    Perineal Body/Introitus   Normal    External Palpation  no  tenderness    Pelvic Floor Internal Exam  Patient confirmed identification and approves PT to assess pelvic floor and treatment    Exam Type  Vaginal    Palpation  tenderness located on bil. levator ani with left worse than right tightness along the introitus especiall on the left    Strength  fair squeeze, definite lift    Strength # of reps  5       [facility] Adult PT Treatment/Exercise - 06/07/19 0001      Self-Care   Self-Care  Other Self-Care Comments    Other Self-Care Comments   education on self massage to the introitus to expand the tissue             PT Education - 06/07/19 1314    Education Details  education on vaginal self soft tissue    Person(s) Educated  Patient    Methods  Explanation;Handout    Comprehension  Verbalized understanding       PT Short Term Goals - 06/07/19 1330      PT SHORT TERM GOAL #1   Title  independent with initial HEP    Time  4    Period  Weeks    Status  New    Target Date  07/05/19      PT SHORT TERM GOAL #2   Title  education on using the dilator with lubrincant    Time  4    Period  Weeks    Status  New    Target Date  07/05/19        PT Long Term Goals - 06/07/19 1331      PT LONG TERM GOAL #1   Title  independent with advanced HEP    Time  12    Period  Weeks    Status  New    Target Date  08/30/19      PT LONG TERM GOAL #2   Title  able to use the larges dilator with no increased of pain >/= 3/10 due to elongation of vaginal tissue    Time  12    Period  Weeks    Status  New    Target Date  08/30/19      PT LONG TERM GOAL #3   Title  able to have penile penetration with no pain >/= 3/10 and performing relaxation breathing    Time  12    Period  Weeks    Status  New    Target Date  08/30/19      PT LONG TERM GOAL #4   Title  able to stand on on leg for 15 seconds 3/3 times due to improved hip strength and balance to prevent future falls    Time  12    Period  Weeks    Status  New    Target Date   08/30/19             Plan - 06/07/19 1321    Clinical Impression Statement  Patient is a 64 year old female s/p anal cancer with chemotherapy and radiation treatment from 02/22/2019-03/27/2019. Patient reports dryness in the vaginal area and has been using coconut oil in the area. Patient has decreased size of the vaginal open. Patient has tightness in the introitus. Patient has tendenress located in bilateral levator ani with left worse than the right. Pelvic floor strength is 3/5 holding for 5 seconds due to decreased endurance. Patient has weakness in bilateral hip abduction. Patient has dififculty with standing on one leg due to feeling unbalanced and parathesias in bilateral feet from the chemotherapy. Patient has not had intercourse with her husband since she has been diagnosed with anal cancer due to fear. Patient would benefit from skilled therapy to improve pelvic health to expand the vaginal canal for penile penetration and vaginal exam and to increase strength for balance.    Personal Factors and Comorbidities  Comorbidity 1;Comorbidity 2;Fitness    Comorbidities  Abdominal hysterectomy; Squamous cell carcinoma of rectum 01/13/2019, concurrent chemotherapy and radiation from 02/22/2019-03/27/2019    Examination-Activity Limitations  Locomotion Level    Examination-Participation Restrictions  Interpersonal Relationship    Stability/Clinical Decision Making  Evolving/Moderate complexity    Clinical Decision Making  Low    Rehab Potential  Excellent    PT Frequency  1x / week    PT Duration  12 weeks    PT Treatment/Interventions  Biofeedback;Electrical Stimulation;Therapeutic activities;Therapeutic exercise;Neuromuscular re-education;Manual techniques;Patient/family education;Dry needling    PT Next Visit Plan  education on using dilator, hip stretches, pelvic floor strength, press and release massage to the perineum, SLS for balance, diaphragmatic breathing    Consulted and Agree with  Plan of Care  Patient       Patient will benefit from skilled therapeutic intervention in order to improve the following deficits and impairments:  Decreased coordination, Decreased range of motion, Increased fascial restricitons, Decreased strength, Pain  Visit Diagnosis: Muscle weakness (generalized) - Plan: PT plan of care cert/re-cert  Anal cancer ([place]) - Plan: PT plan of care cert/re-cert     Problem List Patient Active Problem List   Diagnosis Date Noted  . Perirectal abscess 01/15/2019  . Rectal pain 01/13/2019  . Malignant neoplasm of anus ([place]) 01/13/2019  . IBD (inflammatory bowel disease) - colitis 01/11/2018  . Hyperlipidemia 10/25/2017  . Hyperglycemia 10/25/2017  . Essential hypertension, benign 10/24/2017    Earlie Counts, PT 06/07/19 1:35 PM   [facility] [address], [place], [place], [address] Phone: [phone]   Fax:  [phone]  Name: Leah Bailey MRN: [id number] Date of Birth: 1956-04-06

## 2019-06-16 NOTE — Progress Notes (Signed)
[place]   [phone] Fax:([phone]) [phone]   Clinic Follow up Note   Patient Care Team: Harrison Mons, PA as PCP - General (Family Medicine) Jovita Kussmaul, MD as Consulting Physician (General Surgery) Ileana Roup, MD as Consulting Physician (General Surgery) Kyung Rudd, MD as Consulting Physician (Radiation Oncology) Truitt Merle, MD as Consulting Physician (Hematology) Virgina Evener, Dawn, RN (Inactive) as Oncology Nurse Navigator  Date of Service:  06/21/2019  CHIEF COMPLAINT: F/u rectal cancer  SUMMARY OF ONCOLOGIC HISTORY: Oncology History Overview Note  Cancer Staging Squamous cell carcinoma of rectum [facility]) Staging form: Colon and Rectum, AJCC 8th Edition - Clinical stage from 02/04/2019: Stage IIIA (cT2, cN1a, cM0) - Signed by Truitt Merle, MD on 02/04/2019     Malignant neoplasm of anus ([place])  01/13/2019 Initial Diagnosis   Squamous cell carcinoma of rectum ([place])   01/14/2019 Imaging   CT Pelvis W Contrast 01/14/19 IMPRESSION: Perirectal abscess, at the left posterior aspect of the rectum, inseparable from the wall of the colon and the adjacent pelvic musculature. Largest diameter on cranial caudal imaging measures 4.8 cm.   Asymmetric thickening of the rectum associated with the abscess. A perforated rectal carcinoma is favored given the appearance, and correlation with physical exam as well anoscopy/rigid sigmoidoscopy. Alternatively, the changes may reflect chronic inflammation in the setting of inflammatory bowel disease.   There are several small lymph nodes within the perirectal fat and along the left pelvis, the largest measuring 13 mm, potentially pathologic in the setting of carcinoma, or alternatively reactive.   01/15/2019 Initial Diagnosis   Diagnosis 01/15/19 Soft tissue, abscess, rectal wall - INVASIVE SQUAMOUS CELL CARCINOMA, MODERATELY TO POORLY DIFFERENTIATED.   02/03/2019 Imaging   CT Chest W Contrast 02/03/19   IMPRESSION: 1. Two nodules each measuring about 4 mm in long axis noted in the right upper lobe. One of these is subpleural. These are likely benign but given the context, surveillance imaging in 6-12 months time should be considered. Fleischner criteria for follow do not directly apply given the history of malignancy.   02/04/2019 Cancer Staging   Staging form: Colon and Rectum, AJCC 8th Edition - Clinical stage from 02/04/2019: Stage IIIA (cT2, cN1a, cM0) - Signed by Truitt Merle, MD on 02/04/2019   02/15/2019 PET scan   IMPRESSION: 1. Hypermetabolic eccentric rectal wall thickening with hypermetabolic left perirectal adenopathy. No evidence of distant metastatic disease. 2. Possible gallbladder sludge. 3. Left renal stone.   02/22/2019 - 03/22/2019 Chemotherapy   ChemoRT with Mitomycin/5FU week 1 and 5 starting 02/22/19 and ending 03/22/19   02/22/2019 - 04/02/2019 Radiation Therapy   ChemoRT with Dr. Sondra Come 02/22/19-04/02/19      CURRENT THERAPY:  Surveillance   INTERVAL HISTORY:  Leah Bailey is here for a follow up of rectal cancer. She presents to the clinic alone. She notes she feels much better since chemoRT. Her treatment pain has resolved and her BMs are regular. She notes she only has residual fatigue with 45% of baseline energy. She can still do her activities but much slower. She is working on building her strength back. She notes she has started to walk. 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 She feels she is eating adequately.  She was seen by Dr. Dema Severin last week. I reviewed her medication list with her. She has not restarted aspirin again. She is not on pain or antiemetic medication.     REVIEW OF SYSTEMS:   Constitutional: Denies fevers,  chills or abnormal weight loss (+) Moderate fatigue Eyes: Denies blurriness of vision Ears, nose, mouth, throat, and face: Denies mucositis or sore throat Respiratory: Denies cough, dyspnea or wheezes  Cardiovascular: Denies palpitation, chest discomfort or lower extremity swelling Gastrointestinal:  Denies nausea, heartburn or change in bowel habits Skin: Denies abnormal skin rashes Lymphatics: Denies new lymphadenopathy or easy bruising Neurological:Denies numbness, tingling or new weaknesses Behavioral/Psych: Mood is stable, no new changes  All other systems were reviewed with the patient and are negative.  MEDICAL HISTORY:  Past Medical History:  Diagnosis Date  . Herpes simplex disciform keratitis 10/20/2013  . Hypertension   . IBD (inflammatory bowel disease) - colitis 01/11/2018  . Nuclear sclerosis of both eyes 11/30/2014  . Squamous cell carcinoma of rectum ([place]) 01/13/2019  . UC (ulcerative colitis) ([place])     SURGICAL HISTORY: Past Surgical History:  Procedure Laterality Date  . ABDOMINAL HYSTERECTOMY    . BREAST CYST ASPIRATION Left 1983  . BREAST LUMPECTOMY Left   . COLONOSCOPY    . INCISION AND DRAINAGE PERIRECTAL ABSCESS N/A 01/15/2019   Procedure: INTERNAL DRAINAGE OF PERIRECTAL ABSCESS;  Surgeon: Jovita Kussmaul, MD;  Location: [facility];  Service: General;  Laterality: N/A;  . TUBAL LIGATION      I have reviewed the social history and family history with the patient and they are unchanged from previous note.  ALLERGIES:  has No Known Allergies.  MEDICATIONS:  Current Outpatient Medications  Medication Sig Dispense Refill  . ferrous sulfate 325 (65 FE) MG EC tablet Take 650 mg by mouth daily with breakfast.     . lisinopril-hydrochlorothiazide (PRINZIDE,ZESTORETIC) 20-25 MG tablet Take 1 tablet by mouth daily. 90 tablet 3  . mesalamine (LIALDA) 1.2 g EC tablet TAKE 2 TABLETS (2.4 G TOTAL) BY MOUTH DAILY WITH BREAKFAST. 180 tablet 3  . Multiple Vitamins-Calcium (ONE-A-DAY WOMENS PO) Take 1 tablet by mouth daily.    Marland Kitchen aspirin 81 MG tablet Take 81 mg by mouth daily.    . naproxen sodium (ALEVE) 220 MG tablet Take 220 mg by mouth daily as needed (pain).      No  current facility-administered medications for this visit.    PHYSICAL EXAMINATION: ECOG PERFORMANCE STATUS: 1 - Symptomatic but completely ambulatory  Vitals:   06/21/19 1421  BP: 115/80  Pulse: 84  Resp: 18  Temp: 98.1 F (36.7 C)  SpO2: 98%   Filed Weights   06/21/19 1421  Weight: 152 lb 4.8 oz (69.1 kg)    GENERAL:alert, no distress and comfortable SKIN: skin color, texture, turgor are normal, no rashes or significant lesions EYES: normal, Conjunctiva are pink and non-injected, sclera clear  NECK: supple, thyroid normal size, non-tender, without nodularity LYMPH:  no palpable lymphadenopathy in the cervical, axillary  LUNGS: clear to auscultation and percussion with normal breathing effort HEART: regular rate & rhythm and no murmurs and no lower extremity edema ABDOMEN:abdomen soft, non-tender and normal bowel sounds, rectal exam deferred today  Musculoskeletal:no cyanosis of digits and no clubbing  NEURO: alert & oriented x 3 with fluent speech, no focal motor/sensory deficits  LABORATORY DATA:  I have reviewed the data as listed CBC Latest Ref Rng & Units 06/21/2019 05/17/2019 04/19/2019  WBC 4.0 - 10.5 K/uL 3.1(L) 2.7(L) 2.8(L)  Hemoglobin 12.0 - 15.0 g/dL 10.9(L) 10.1(L) 8.0(L)  Hematocrit 36.0 - 46.0 % 34.0(L) 32.4(L) 26.0(L)  Platelets 150 - 400 K/uL 260 257 265     CMP Latest Ref Rng & Units 06/21/2019 05/17/2019  04/19/2019  Glucose 70 - 99 mg/dL 92 87 140(H)  BUN 8 - 23 mg/dL 17 12 9   Creatinine 0.44 - 1.00 mg/dL 0.76 0.72 0.73  Sodium 135 - 145 mmol/L 141 140 143  Potassium 3.5 - 5.1 mmol/L 4.5 4.3 3.8  Chloride 98 - 111 mmol/L 105 105 109  CO2 22 - 32 mmol/L 28 26 24   Calcium 8.9 - 10.3 mg/dL 9.4 9.0 8.9  Total Protein 6.5 - 8.1 g/dL 7.1 7.0 6.8  Total Bilirubin 0.3 - 1.2 mg/dL 0.2(L) <0.2(L) <0.2(L)  Alkaline Phos 38 - 126 U/L 74 73 73  AST 15 - 41 U/L 20 32 27  ALT 0 - 44 U/L 33 51(H) 44      RADIOGRAPHIC STUDIES: I have personally reviewed the  radiological images as listed and agreed with the findings in the report. No results found.   ASSESSMENT & PLAN:  Leah Bailey is a 64 y.o. female with   1.Squamous cell carcinoma of rectum, Ct2N1aM0 stage IIIA -She was diagnosed in 12/2018.Her CT scans shows low rectal wall thickeningwithabscessand surrounding lymphadenopathy, largest up to 91m. Chest scan also shows 2 tiny 441mright lung nodules which are likely benign.PET shows no distant metastasis. -She has completed concurrent chemoRT with Mitomycin and 5FU 10/5 to 04/02/19. She overall tolerated moderately well. -She was seen by Dr. WhDema Severinast week, his rectal exam indicates excellent clinical response to treatment, no palpable mass. I did not repeat rectal exam today  -She is clinically doing well and has recovered from Chemo RT with residual moderate fatigue. Her energy is 45% of baseline. She has normalized bowel movements. I encouraged her to continue working on increasing activity level and eating adequately.  -plan to repeat PET scan in one month.  If she has had complete response, or start surveillance.  If she has reduced disease, surgery will be considered. -I discussed the risk of cancer recurrence in the future. I discussed the surveillance plan, which is a physical exam and lab test (including CBC, CMP) every 3 months for the first 2 years, then every 6-12 months, and surveillance CT scan every 6-12 month for up to 5 year. Next scan in a month -Physical exam unremarkable. Will do labs today.  -Phone visit with scan results. Survivorship Clinic in 3 months.    2. Anemia, GI Bleeding -Per patient she has had chronic mildly low anemia. She has been on oral ferrous sulfate.  -This has worsened lately secondary to her cancer and GI bleeding, asymptomatic.  -Will repeat labs today post treatment.   3. Rectal Pain  -Secondary to #1 and chemo RT -Managed with oxycodone and tramadol.  -Since ChemoRT her pain has  resolved. She is no longer on pain medication.  -She will continue Pelvic PT. She is interested vaginal dilator for stretching as well. I recommend she speak with her Gyn about this.    4. Ulcerative Colitis/IBD, Diverticulosis  -Found incidentally on 12/2017 screening colonoscopy  -On Lialda given by Dr GeCarlean Purl-Continue Prilosec -BMs have  Been regular since she has [place]   5. HTN -On Lisinopril-HCTZ, BP slightly low, she is asymptomatic, I told her to stop medication -I offered her IVF today, she declined  -she will monitor her BP at home    PLAN: -Lab today -PET scan in one month with phone call a few day after  -Survivorship clinic with [facility] in 3 months virtually.  -she will continue pelvic PT, f/u with GYN for  vaginal dilatation      No problem-specific Assessment & Plan notes found for this encounter.   Orders Placed This Encounter  Procedures  . NM PET Image Restag (PS) Skull Base To Thigh    Standing Status:   Future    Standing Expiration Date:   06/20/2020    Order Specific Question:   If indicated for the ordered procedure, I authorize the administration of a radiopharmaceutical per Radiology protocol    Answer:   Yes    Order Specific Question:   Preferred imaging location?    Answer:   [facility]    Order Specific Question:   Radiology Contrast Protocol - do NOT remove file path    Answer:   \\[email or web address]   All questions were answered. The patient knows to call the clinic with any problems, questions or concerns. No barriers to learning was detected. The total time spent in the appointment was 30 minutes.     Truitt Merle, MD 06/21/2019   I, Joslyn Devon, am acting as scribe for Truitt Merle, MD.   I have reviewed the above documentation for accuracy and completeness, and I agree with the above.

## 2019-06-17 ENCOUNTER — Ambulatory Visit: Payer: BC Managed Care – PPO | Admitting: Physical Therapy

## 2019-06-17 ENCOUNTER — Other Ambulatory Visit: Payer: Self-pay

## 2019-06-17 ENCOUNTER — Encounter: Payer: Self-pay | Admitting: Physical Therapy

## 2019-06-17 DIAGNOSIS — M6281 Muscle weakness (generalized): Secondary | ICD-10-CM | POA: Diagnosis not present

## 2019-06-17 DIAGNOSIS — C210 Malignant neoplasm of anus, unspecified: Secondary | ICD-10-CM

## 2019-06-17 NOTE — Patient Instructions (Signed)
Access Code: [email or web address]  URL: [email or web address]  Date: 06/17/2019  Prepared by: Earlie Counts   Exercises Seated Piriformis Stretch with Trunk Bend - 1 reps - 1 sets - 30 sec hold - 1x daily - 7x weekly Seated Hamstring Stretch - 1 reps - 1 sets - 30 sec hold - 1x daily - 7x weekly Deep Squat with Pelvic Floor Relaxation - 1 reps - 1 sets - 30 sec hold - 1x daily - 7x weekly Double Leg Hamstring Stretch at Wall - 1 reps - 1 sets - 1-2 min hold - 1x daily - 7x weekly Cat-Camel - 10 reps - 1 sets - 1x daily - 7x weekly Child's Pose Stretch - 1 reps - 1 sets - 1-2 min hold - 1x daily - 7x weekly Prone Press Up - 5 reps - 1 sets - 2-3 sec hold - 1x daily - 7x weekly Supine Pelvic Floor Contract and Release - 10 reps - 1 sets - 5 sec hold - 1x daily - 7x weekly [facility] [address], [place], [place] [address] Phone # [phone] Fax [phone]

## 2019-06-17 NOTE — Therapy (Signed)
[facility] [address], [place], [place], [address] Phone: [phone]   Fax:  [phone]  Physical Therapy Treatment  Patient Details  Name: Leah Bailey MRN: [id number] Date of Birth: 05-23-1955 Referring Provider (PT): Hayden Pedro, [place]   Encounter Date: 06/17/2019  PT End of Session - 06/17/19 1314    Visit Number  2    Date for PT Re-Evaluation  08/30/19    Authorization Type  BCBS    PT Start Time  1230    PT Stop Time  1308    PT Time Calculation (min)  38 min    Activity Tolerance  Patient tolerated treatment well;No increased pain    Behavior During Therapy  WFL for tasks assessed/performed       Past Medical History:  Diagnosis Date  . Herpes simplex disciform keratitis 10/20/2013  . Hypertension   . IBD (inflammatory bowel disease) - colitis 01/11/2018  . Nuclear sclerosis of both eyes 11/30/2014  . Squamous cell carcinoma of rectum ([place]) 01/13/2019  . UC (ulcerative colitis) [facility])     Past Surgical History:  Procedure Laterality Date  . ABDOMINAL HYSTERECTOMY    . BREAST CYST ASPIRATION Left 1983  . BREAST LUMPECTOMY Left   . COLONOSCOPY    . INCISION AND DRAINAGE PERIRECTAL ABSCESS N/A 01/15/2019   Procedure: INTERNAL DRAINAGE OF PERIRECTAL ABSCESS;  Surgeon: Jovita Kussmaul, MD;  Location: [facility];  Service: General;  Laterality: N/A;  . TUBAL LIGATION      There were no vitals filed for this visit.  Subjective Assessment - 06/17/19 1236    Subjective  they did not leave the dilator. Everything else is okay.    Pertinent History  anal cancer with radiation and chemotherapy from 02/22/2019-03/27/2019    Patient Stated Goals  expand the vaginal canal    Currently in Pain?  No/denies    Multiple Pain Sites  No                    Pelvic Floor Special Questions - 06/17/19 0001    Pelvic Floor Internal Exam  Patient confirmed identification and approves PT to assess  pelvic floor and treatment    Exam Type  Vaginal    Palpation  less tenderness    Strength  fair squeeze, definite lift        [facility] Adult PT Treatment/Exercise - 06/17/19 0001      Lumbar Exercises: Stretches   Active Hamstring Stretch  Right;Left;1 rep;30 seconds    Active Hamstring Stretch Limitations  sitting    Press Ups  5 reps;5 seconds    Quadruped Mid Back Stretch  1 rep;30 seconds    Piriformis Stretch  Right;Left;1 rep;30 seconds    Piriformis Stretch Limitations  sitting    Other Lumbar Stretch Exercise  squat on wall and in standing      Lumbar Exercises: Quadruped   [place]cat/Old Horse  15 reps      Manual Therapy   Manual Therapy  Internal Pelvic Floor    Internal Pelvic Floor  along the introitus to improve pelvic floor contraction, along the levator ani, less tenderness in the pelvic floor muscles             PT Education - 06/17/19 1307    Education Details  Access Code: [email or web address]    Person(s) Educated  Patient    Methods  Explanation;Demonstration;Verbal cues;Handout    Comprehension  Verbalized understanding;Returned demonstration  PT Short Term Goals - 06/17/19 1315      PT SHORT TERM GOAL #1   Title  independent with initial HEP    Time  4    Period  Weeks    Status  Achieved        PT Long Term Goals - 06/07/19 1331      PT LONG TERM GOAL #1   Title  independent with advanced HEP    Time  12    Period  Weeks    Status  New    Target Date  08/30/19      PT LONG TERM GOAL #2   Title  able to use the larges dilator with no increased of pain >/= 3/10 due to elongation of vaginal tissue    Time  12    Period  Weeks    Status  New    Target Date  08/30/19      PT LONG TERM GOAL #3   Title  able to have penile penetration with no pain >/= 3/10 and performing relaxation breathing    Time  12    Period  Weeks    Status  New    Target Date  08/30/19      PT LONG TERM GOAL #4   Title  able to stand on on leg for 15 seconds 3/3  times due to improved hip strength and balance to prevent future falls    Time  12    Period  Weeks    Status  New    Target Date  08/30/19            Plan - 06/17/19 1307    Clinical Impression Statement  Patient is doing a great job with the perineal soft tissue work to expand the introitus. The tissue is elongating and therapist able to place two fingers in the introitus. Patient pelvic floor strength is 3/5 and able to perform a circular contraction after manual work. Patient has learned stretches to be done to increase hip flexibility. Patient will benefit from skilled therapy to improve plevic health to exand the vaginal canal for penile penetration and vaginal exam and to increase strength for balance.    Personal Factors and Comorbidities  Comorbidity 1;Comorbidity 2;Fitness    Comorbidities  Abdominal hysterectomy; Squamous cell carcinoma of rectum 01/13/2019, concurrent chemotherapy and radiation from 02/22/2019-03/27/2019    Examination-Activity Limitations  Locomotion Level    Examination-Participation Restrictions  Interpersonal Relationship    Stability/Clinical Decision Making  Evolving/Moderate complexity    Rehab Potential  Excellent    PT Frequency  1x / week    PT Duration  12 weeks    PT Treatment/Interventions  Biofeedback;Electrical Stimulation;Therapeutic activities;Therapeutic exercise;Neuromuscular re-education;Manual techniques;Patient/family education;Dry needling    PT Next Visit Plan  education on using dilator,  pelvic floor strength, SLS for balance, diaphragmatic breathing    PT Home Exercise Plan  Access Code: [email or web address]    Recommended Other Services  MD signed the initial eval    Consulted and Agree with Plan of Care  Patient       Patient will benefit from skilled therapeutic intervention in order to improve the following deficits and impairments:  Decreased coordination, Decreased range of motion, Increased fascial restricitons, Decreased strength,  Pain  Visit Diagnosis: Muscle weakness (generalized)  Anal cancer ([place])     Problem List Patient Active Problem List   Diagnosis Date Noted  . Perirectal abscess 01/15/2019  . Rectal pain 01/13/2019  .  Malignant neoplasm of anus ([place]) 01/13/2019  . IBD (inflammatory bowel disease) - colitis 01/11/2018  . Hyperlipidemia 10/25/2017  . Hyperglycemia 10/25/2017  . Essential hypertension, benign 10/24/2017    Earlie Counts, PT 06/17/19 1:16 PM   [facility] [address] [place], [place], [address] Phone: [phone]   Fax:  [phone]  Name: Leah Bailey MRN: [id number] Date of Birth: August 12, 1955

## 2019-06-21 ENCOUNTER — Other Ambulatory Visit: Payer: Self-pay

## 2019-06-21 ENCOUNTER — Encounter: Payer: Self-pay | Admitting: Hematology

## 2019-06-21 ENCOUNTER — Inpatient Hospital Stay: Payer: BC Managed Care – PPO | Attending: Hematology | Admitting: Hematology

## 2019-06-21 ENCOUNTER — Inpatient Hospital Stay: Payer: BC Managed Care – PPO

## 2019-06-21 VITALS — BP 115/80 | HR 84 | Temp 98.1°F | Resp 18 | Ht 65.0 in | Wt 152.3 lb

## 2019-06-21 DIAGNOSIS — C210 Malignant neoplasm of anus, unspecified: Secondary | ICD-10-CM | POA: Diagnosis not present

## 2019-06-21 DIAGNOSIS — Z85048 Personal history of other malignant neoplasm of rectum, rectosigmoid junction, and anus: Secondary | ICD-10-CM | POA: Insufficient documentation

## 2019-06-21 DIAGNOSIS — D649 Anemia, unspecified: Secondary | ICD-10-CM

## 2019-06-21 DIAGNOSIS — K922 Gastrointestinal hemorrhage, unspecified: Secondary | ICD-10-CM | POA: Diagnosis not present

## 2019-06-21 DIAGNOSIS — K5190 Ulcerative colitis, unspecified, without complications: Secondary | ICD-10-CM | POA: Diagnosis not present

## 2019-06-21 DIAGNOSIS — I10 Essential (primary) hypertension: Secondary | ICD-10-CM | POA: Insufficient documentation

## 2019-06-21 DIAGNOSIS — C20 Malignant neoplasm of rectum: Secondary | ICD-10-CM

## 2019-06-21 LAB — CBC WITH DIFFERENTIAL (CANCER CENTER ONLY)
Abs Immature Granulocytes: 0.01 10*3/uL (ref 0.00–0.07)
Basophils Absolute: 0 10*3/uL (ref 0.0–0.1)
Basophils Relative: 1 %
Eosinophils Absolute: 0 10*3/uL (ref 0.0–0.5)
Eosinophils Relative: 1 %
HCT: 34 % — ABNORMAL LOW (ref 36.0–46.0)
Hemoglobin: 10.9 g/dL — ABNORMAL LOW (ref 12.0–15.0)
Immature Granulocytes: 0 %
Lymphocytes Relative: 19 %
Lymphs Abs: 0.6 10*3/uL — ABNORMAL LOW (ref 0.7–4.0)
MCH: 29.9 pg (ref 26.0–34.0)
MCHC: 32.1 g/dL (ref 30.0–36.0)
MCV: 93.4 fL (ref 80.0–100.0)
Monocytes Absolute: 0.3 10*3/uL (ref 0.1–1.0)
Monocytes Relative: 10 %
Neutro Abs: 2.2 10*3/uL (ref 1.7–7.7)
Neutrophils Relative %: 69 %
Platelet Count: 260 10*3/uL (ref 150–400)
RBC: 3.64 MIL/uL — ABNORMAL LOW (ref 3.87–5.11)
RDW: 14.5 % (ref 11.5–15.5)
WBC Count: 3.1 10*3/uL — ABNORMAL LOW (ref 4.0–10.5)
nRBC: 0 % (ref 0.0–0.2)

## 2019-06-21 LAB — CMP (CANCER CENTER ONLY)
ALT: 33 U/L (ref 0–44)
AST: 20 U/L (ref 15–41)
Albumin: 3.9 g/dL (ref 3.5–5.0)
Alkaline Phosphatase: 74 U/L (ref 38–126)
Anion gap: 8 (ref 5–15)
BUN: 17 mg/dL (ref 8–23)
CO2: 28 mmol/L (ref 22–32)
Calcium: 9.4 mg/dL (ref 8.9–10.3)
Chloride: 105 mmol/L (ref 98–111)
Creatinine: 0.76 mg/dL (ref 0.44–1.00)
GFR, Est AFR Am: >60 mL/min (ref >60)
GFR, Estimated: >60 mL/min (ref >60)
Glucose, Bld: 92 mg/dL (ref 70–99)
Potassium: 4.5 mmol/L (ref 3.5–5.1)
Sodium: 141 mmol/L (ref 135–145)
Total Bilirubin: 0.2 mg/dL — ABNORMAL LOW (ref 0.3–1.2)
Total Protein: 7.1 g/dL (ref 6.5–8.1)

## 2019-06-22 LAB — FERRITIN: Ferritin: 406 ng/mL — ABNORMAL HIGH (ref 11–307)

## 2019-06-23 ENCOUNTER — Telehealth: Payer: Self-pay | Admitting: Hematology

## 2019-06-23 NOTE — Telephone Encounter (Signed)
Scheduled appt per 2/1 los.  Sent a message to HIM pool to get a calendar mailed out. 

## 2019-06-24 ENCOUNTER — Other Ambulatory Visit: Payer: Self-pay

## 2019-06-24 ENCOUNTER — Encounter: Payer: Self-pay | Admitting: Physical Therapy

## 2019-06-24 ENCOUNTER — Ambulatory Visit: Payer: BC Managed Care – PPO | Attending: Radiation Oncology | Admitting: Physical Therapy

## 2019-06-24 DIAGNOSIS — M6281 Muscle weakness (generalized): Secondary | ICD-10-CM | POA: Diagnosis present

## 2019-06-24 DIAGNOSIS — C210 Malignant neoplasm of anus, unspecified: Secondary | ICD-10-CM | POA: Diagnosis present

## 2019-06-24 NOTE — Therapy (Signed)
[facility] [address], [place], [place], [address] Phone: [phone]   Fax:  [phone]  Physical Therapy Treatment  Patient Details  Name: Leah Bailey MRN: [id number] Date of Birth: May 19, 1956 Referring Provider (PT): Hayden Pedro, [place]   Encounter Date: 06/24/2019  PT End of Session - 06/24/19 1307    Visit Number  3    Date for PT Re-Evaluation  08/30/19    Authorization Type  BCBS    PT Start Time  1230    PT Stop Time  1308    PT Time Calculation (min)  38 min    Activity Tolerance  Patient tolerated treatment well;No increased pain    Behavior During Therapy  WFL for tasks assessed/performed       Past Medical History:  Diagnosis Date  . Herpes simplex disciform keratitis 10/20/2013  . Hypertension   . IBD (inflammatory bowel disease) - colitis 01/11/2018  . Nuclear sclerosis of both eyes 11/30/2014  . Squamous cell carcinoma of rectum ([place]) 01/13/2019  . UC (ulcerative colitis) [facility])     Past Surgical History:  Procedure Laterality Date  . ABDOMINAL HYSTERECTOMY    . BREAST CYST ASPIRATION Left 1983  . BREAST LUMPECTOMY Left   . COLONOSCOPY    . INCISION AND DRAINAGE PERIRECTAL ABSCESS N/A 01/15/2019   Procedure: INTERNAL DRAINAGE OF PERIRECTAL ABSCESS;  Surgeon: Jovita Kussmaul, MD;  Location: [facility];  Service: General;  Laterality: N/A;  . TUBAL LIGATION      There were no vitals filed for this visit.  Subjective Assessment - 06/24/19 1235    Subjective  I picked up my dilator. I made a gynecological appointment. I do not feel as tight.    Pertinent History  anal cancer with radiation and chemotherapy from 02/22/2019-03/27/2019    Patient Stated Goals  expand the vaginal canal    Currently in Pain?  No/denies                       [facility] Adult PT Treatment/Exercise - 06/24/19 0001      Self-Care   Self-Care  Other Self-Care Comments    Other Self-Care Comments    education on how to use dilator, how to progress and no painmore than 3/10      Neuro Re-ed    Neuro Re-ed Details   tandem stance without support with and without foam single leg stance with and without foam; stand no one leg and othe moves with a clock; single leg stance with reaching to the ground,       Lumbar Exercises: Aerobic   Elliptical  level 3  for 4 min while assessing patient and building endurance             PT Education - 06/24/19 1302    Education Details  Access Code: [email or web address]; how to progress with dilator    Person(s) Educated  Patient    Methods  Explanation;Demonstration;Verbal cues;Handout    Comprehension  Verbalized understanding;Returned demonstration       PT Short Term Goals - 06/24/19 1312      PT SHORT TERM GOAL #1   Title  independent with initial HEP    Time  4    Period  Weeks    Status  Achieved      PT SHORT TERM GOAL #2   Title  education on using the dilator with lubrincant    Time  4  Period  Weeks    Status  Achieved        PT Long Term Goals - 06/07/19 1331      PT LONG TERM GOAL #1   Title  independent with advanced HEP    Time  12    Period  Weeks    Status  New    Target Date  08/30/19      PT LONG TERM GOAL #2   Title  able to use the larges dilator with no increased of pain >/= 3/10 due to elongation of vaginal tissue    Time  12    Period  Weeks    Status  New    Target Date  08/30/19      PT LONG TERM GOAL #3   Title  able to have penile penetration with no pain >/= 3/10 and performing relaxation breathing    Time  12    Period  Weeks    Status  New    Target Date  08/30/19      PT LONG TERM GOAL #4   Title  able to stand on on leg for 15 seconds 3/3 times due to improved hip strength and balance to prevent future falls    Time  12    Period  Weeks    Status  New    Target Date  08/30/19            Plan - 06/24/19 1307    Clinical Impression Statement  Patient is having less pain and her  pelvic floor is feeling stronger. Patient is learning core and balancing exercises. Patient understands how to use the dilators and how to progress herself. Patient used the elliptical today to work on her balance. Patient will benefit from skilled therapy to exapnd the vaginal canal for penile penetration and vaginal exam and to increase strength and balance.    Personal Factors and Comorbidities  Comorbidity 1;Comorbidity 2;Fitness    Comorbidities  Abdominal hysterectomy; Squamous cell carcinoma of rectum 01/13/2019, concurrent chemotherapy and radiation from 02/22/2019-03/27/2019    Examination-Activity Limitations  Locomotion Level    Examination-Participation Restrictions  Interpersonal Relationship    Rehab Potential  Excellent    PT Frequency  1x / week    PT Duration  12 weeks    PT Treatment/Interventions  Biofeedback;Electrical Stimulation;Therapeutic activities;Therapeutic exercise;Neuromuscular re-education;Manual techniques;Patient/family education;Dry needling    PT Next Visit Plan  see how dilator is going, elliptical, relaxation while using the dilator, core strength with pelvic floor exercise    PT Home Exercise Plan  Access Code: [email or web address]    Consulted and Agree with Plan of Care  Patient       Patient will benefit from skilled therapeutic intervention in order to improve the following deficits and impairments:  Decreased coordination, Decreased range of motion, Increased fascial restricitons, Decreased strength, Pain  Visit Diagnosis: Muscle weakness (generalized)  Anal cancer [facility])     Problem List Patient Active Problem List   Diagnosis Date Noted  . Perirectal abscess 01/15/2019  . Rectal pain 01/13/2019  . Malignant neoplasm of anus ([place]) 01/13/2019  . IBD (inflammatory bowel disease) - colitis 01/11/2018  . Hyperlipidemia 10/25/2017  . Hyperglycemia 10/25/2017  . Essential hypertension, benign 10/24/2017    Earlie Counts, PT 06/24/19 1:14 PM   [facility] [address], [place], [place], [address] Phone: [phone]   Fax:  [phone]  Name: Leah Bailey MRN: [id number] Date of  Birth: 1955-09-18

## 2019-06-24 NOTE — Patient Instructions (Addendum)
[facility] [address] [place], [place] [address] Phone # [phone] Kyson [email or web address] Fax [phone]  PROTOCOL FOR VAGINAL DILATORS   1. Wash dilator with soap and water prior to insertion.    2. Lay on your back reclined. Knees are to be up and apart while on your bed or in the bathtub with warm water.   3. Lubricate the end of the dilator with a water-soluble lubricant, 1/8-1/4 of teaspoon.  4. Massage around the the vaginal opening with a circular motion.   5. Separate the labia.  6. Start with the smallest dilator  7. Tense the pelvic floor muscles than relax; while relaxing, slide lubricated dilator( round side of dilator) into the vagina.  Insert toward the direction of your spine. Dilator should feel snug and no pain more than 3/10.   8. Tense muscles again while holding the dilator so it does not get pushed out; relax and slide it in a little further.   9. Try blowing out as if filling a balloon; this may relax the muscles and allow penetration.  Repeat blowing out to insert dilator further.  10. Keep dilator in for 10 minutes if tolerate, with the pelvic floor muscles relaxed to further stretch the canal.   11. Never force the dilator into the canal. 12. Once the dilator is comfortable start to move in and out, side to side, move your hips in different directions  13. 3-4 times per week 14. Progression of dilator.  a. When you are able to place dilator into vaginal canal and feel no pain or able to move without difficulty you are ready for the next size.  b. Before you go to the next size start with the original size for 2 minutes then use the next size up for 5 minutes. c. When the next size up is easy to use, do not have to start with the smaller size.  Access Code: [email or web address]  URL: [email or web address]  Date: 06/24/2019  Prepared by: Earlie Counts   Exercises Seated Piriformis Stretch with Trunk Bend - 1 reps  - 1 sets - 30 sec hold - 1x daily - 7x weekly Seated Hamstring Stretch - 1 reps - 1 sets - 30 sec hold - 1x daily - 7x weekly Deep Squat with Pelvic Floor Relaxation - 1 reps - 1 sets - 30 sec hold - 1x daily - 7x weekly Double Leg Hamstring Stretch at Wall - 1 reps - 1 sets - 1-2 min hold - 1x daily - 7x weekly Cat-Camel - 10 reps - 1 sets - 1x daily - 7x weekly Child's Pose Stretch - 1 reps - 1 sets - 1-2 min hold - 1x daily - 7x weekly Prone Press Up - 5 reps - 1 sets - 2-3 sec hold - 1x daily - 7x weekly Supine Pelvic Floor Contract and Release - 10 reps - 1 sets - 5 sec hold - 1x daily - 7x weekly Tandem Stance - 2 reps - 1 sets - 30 sec hold - 1x daily - 7x weekly Single Leg Stance - 2 reps - 1 sets - 15 sec hold - 1x daily - 7x weekly Single Leg Balance with Clock Reach - 2 reps - 1 sets - 1x daily - 7x weekly Single Leg Balance with Alternating Floor Reaches - 10 reps - 1 sets - 1x daily - 7x weekly Isometric Squat with Resistance Loop - 10 reps - 1 sets - 1x daily - 7x weekly Side Stepping with  Resistance at Thighs - 10 reps - 2 sets - 1x daily - 7x weekly

## 2019-07-01 ENCOUNTER — Ambulatory Visit: Payer: BC Managed Care – PPO | Admitting: Physical Therapy

## 2019-07-01 ENCOUNTER — Other Ambulatory Visit: Payer: Self-pay

## 2019-07-01 ENCOUNTER — Encounter: Payer: Self-pay | Admitting: Physical Therapy

## 2019-07-01 DIAGNOSIS — M6281 Muscle weakness (generalized): Secondary | ICD-10-CM

## 2019-07-01 DIAGNOSIS — C210 Malignant neoplasm of anus, unspecified: Secondary | ICD-10-CM

## 2019-07-01 NOTE — Therapy (Signed)
[facility] [address], [place], [place], [address] Phone: [phone]   Fax:  [phone]  Physical Therapy Treatment  Patient Details  Name: Leah Bailey MRN: [id number] Date of Birth: July 11, 1955 Referring Provider (PT): Hayden Pedro, [place]   Encounter Date: 07/01/2019  PT End of Session - 07/01/19 1243    Visit Number  4    Date for PT Re-Evaluation  08/30/19    Authorization Type  BCBS    PT Start Time  1230    PT Stop Time  1308    PT Time Calculation (min)  38 min    Activity Tolerance  Patient tolerated treatment well;No increased pain       Past Medical History:  Diagnosis Date  . Herpes simplex disciform keratitis 10/20/2013  . Hypertension   . IBD (inflammatory bowel disease) - colitis 01/11/2018  . Nuclear sclerosis of both eyes 11/30/2014  . Squamous cell carcinoma of rectum ([place]) 01/13/2019  . UC (ulcerative colitis) [facility])     Past Surgical History:  Procedure Laterality Date  . ABDOMINAL HYSTERECTOMY    . BREAST CYST ASPIRATION Left 1983  . BREAST LUMPECTOMY Left   . COLONOSCOPY    . INCISION AND DRAINAGE PERIRECTAL ABSCESS N/A 01/15/2019   Procedure: INTERNAL DRAINAGE OF PERIRECTAL ABSCESS;  Surgeon: Jovita Kussmaul, MD;  Location: [facility];  Service: General;  Laterality: N/A;  . TUBAL LIGATION      There were no vitals filed for this visit.  Subjective Assessment - 07/01/19 1230    Subjective  I was able to have intercourse with my husband. I have been doing the pelvic exercises. I still am nervouse but no pain. I am on the Small dilator.    Pertinent History  anal cancer with radiation and chemotherapy from 02/22/2019-03/27/2019    Patient Stated Goals  expand the vaginal canal    Currently in Pain?  No/denies                       [facility] Adult PT Treatment/Exercise - 07/01/19 0001      Lumbar Exercises: Stretches   Hip Flexor Stretch  Right;Left;1 rep;60  seconds    Hip Flexor Stretch Limitations  foam roll    Piriformis Stretch  Right;Left;1 rep;60 seconds    Piriformis Stretch Limitations  foam roll    Other Lumbar Stretch Exercise  thoracic stretch with foam roll      Lumbar Exercises: Aerobic   Elliptical  level 3  for 7 min while assessing patient and building endurance      Lumbar Exercises: Supine   Other Supine Lumbar Exercises  alternate shoulder flexion; decompression of spine, alternate hip flexion, alternat shoulder and hip flexion, horizontal shoulder abduction with blue band,              PT Education - 07/01/19 1306    Education Details  Access Code: [email or web address]    Person(s) Educated  Patient    Methods  Explanation;Demonstration;Verbal cues;Handout    Comprehension  Returned demonstration;Verbalized understanding       PT Short Term Goals - 06/24/19 1312      PT SHORT TERM GOAL #1   Title  independent with initial HEP    Time  4    Period  Weeks    Status  Achieved      PT SHORT TERM GOAL #2   Title  education on using the dilator with lubrincant  Time  4    Period  Weeks    Status  Achieved        PT Long Term Goals - 07/01/19 1257      PT LONG TERM GOAL #1   Title  independent with advanced HEP    Time  12    Period  Weeks    Status  On-going      PT LONG TERM GOAL #2   Title  able to use the larges dilator with no increased of pain >/= 3/10 due to elongation of vaginal tissue    Baseline  on the second one with no  pain    Time  12    Period  Weeks    Status  On-going      PT LONG TERM GOAL #3   Title  able to have penile penetration with no pain >/= 3/10 and performing relaxation breathing    Time  12    Period  Weeks    Status  Achieved      PT LONG TERM GOAL #4   Title  able to stand on on leg for 15 seconds 3/3 times due to improved hip strength and balance to prevent future falls    Time  12    Period  Weeks    Status  On-going            Plan - 07/01/19 1246     Clinical Impression Statement  Patient is on the second dilator. Patient was able to have intercourse with her husband and no pain just nervous. Patient reports her endurance is improving. Patient is learning more core strength exercises. Patient is now on a walking program. Patient will benefit from skilled therapy to expand the vaginal canal for penile penetration and vaginal exam and to increase strength and balance.    Personal Factors and Comorbidities  Comorbidity 1;Comorbidity 2;Fitness    Comorbidities  Abdominal hysterectomy; Squamous cell carcinoma of rectum 01/13/2019, concurrent chemotherapy and radiation from 02/22/2019-03/27/2019    Examination-Activity Limitations  Locomotion Level    Examination-Participation Restrictions  Interpersonal Relationship    Stability/Clinical Decision Making  Evolving/Moderate complexity    Rehab Potential  Excellent    PT Frequency  1x / week    PT Duration  12 weeks    PT Treatment/Interventions  Biofeedback;Electrical Stimulation;Therapeutic activities;Therapeutic exercise;Neuromuscular re-education;Manual techniques;Patient/family education;Dry needling    PT Next Visit Plan  possible discharge if doing well, see patient in 2 weeks    PT Home Exercise Plan  Access Code: [email or web address]    Consulted and Agree with Plan of Care  Patient       Patient will benefit from skilled therapeutic intervention in order to improve the following deficits and impairments:  Decreased coordination, Decreased range of motion, Increased fascial restricitons, Decreased strength, Pain  Visit Diagnosis: Muscle weakness (generalized)  Anal cancer ([place])     Problem List Patient Active Problem List   Diagnosis Date Noted  . Perirectal abscess 01/15/2019  . Rectal pain 01/13/2019  . Malignant neoplasm of anus ([place]) 01/13/2019  . IBD (inflammatory bowel disease) - colitis 01/11/2018  . Hyperlipidemia 10/25/2017  . Hyperglycemia 10/25/2017  . Essential hypertension,  benign 10/24/2017    Earlie Counts, PT 07/01/19 1:10 PM   [facility] [address] [place], [place], [address] Phone: [phone]   Fax:  [phone]  Name: Leah Bailey MRN: [id number] Date of Birth: 21-Jan-1956

## 2019-07-01 NOTE — Patient Instructions (Signed)
Access Code: [email or web address]  URL: [email or web address]  Date: 07/01/2019  Prepared by: Earlie Counts   Exercises Seated Piriformis Stretch with Trunk Bend - 1 reps - 1 sets - 30 sec hold - 1x daily - 7x weekly Seated Hamstring Stretch - 1 reps - 1 sets - 30 sec hold - 1x daily - 7x weekly Deep Squat with Pelvic Floor Relaxation - 1 reps - 1 sets - 30 sec hold - 1x daily - 7x weekly Double Leg Hamstring Stretch at Wall - 1 reps - 1 sets - 1-2 min hold - 1x daily - 7x weekly Cat-Camel - 10 reps - 1 sets - 1x daily - 7x weekly Child's Pose Stretch - 1 reps - 1 sets - 1-2 min hold - 1x daily - 7x weekly Prone Press Up - 5 reps - 1 sets - 2-3 sec hold - 1x daily - 7x weekly Supine Pelvic Floor Contract and Release - 10 reps - 1 sets - 5 sec hold - 1x daily - 7x weekly Tandem Stance - 2 reps - 1 sets - 30 sec hold - 1x daily - 7x weekly Single Leg Stance - 2 reps - 1 sets - 15 sec hold - 1x daily - 7x weekly Single Leg Balance with Clock Reach - 2 reps - 1 sets - 1x daily - 7x weekly Single Leg Balance with Alternating Floor Reaches - 10 reps - 1 sets - 1x daily - 7x weekly Isometric Squat with Resistance Loop - 10 reps - 1 sets - 1x daily - 7x weekly Side Stepping with Resistance at Thighs - 10 reps - 2 sets - 1x daily - 7x weekly Supine Shoulder Horizontal Abduction with Resistance - 10 reps - 1 sets - 1x daily - 7x weekly Supine Shoulder External Rotation on Foam Roll with Theraband - 10 reps - 1 sets - 1x daily - 7x weekly Seated Shoulder Diagonal with Resistance - 10 reps - 1 sets - 1x daily - 7x weekly Hip Flexor Mobilization with Foam Roll - 10 reps - 1 sets - 1x daily                            - 7x weekly Piriformis Mobilization on Foam Roll - 10 reps - 1 sets - 1x daily - 7x weekly Sidelying IT Band Foam Roll Mobilization - 10 reps - 1 sets - 1x daily - 7x weekly Thoracic Extension Mobilization on Foam Roll - 10 reps - 1 sets - 1x daily - 7x weekly Thoracic Foam Roll  Mobilization Backstroke - 10 reps - 1 sets - 1x daily - 7x weekly Thoracic Mobilization on Foam Roll - 10 reps - 3 sets - 1x daily - 7x weekly [facility] [address] [place], [place] [address] Phone # [phone] Fax [phone]

## 2019-07-06 ENCOUNTER — Ambulatory Visit: Payer: BC Managed Care – PPO | Admitting: Physical Therapy

## 2019-07-06 NOTE — Progress Notes (Signed)
  Radiation Oncology         ([phone]) [phone] ________________________________  Name: Leah Bailey MRN: [id number]  Date: 04/02/2019  DOB: 1955/07/17  End of Treatment Note  Diagnosis: anal cancer     Indication for treatment:  curative       Radiation treatment dates:   02/22/19 - 04/02/19  Site/dose:   The patient was treated with a course of IMRT using a simultaneous integrated boost technique. Daily image guidance was using during the treatment. The high dose region received a total of 54 Gy.  Narrative: The patient tolerated radiation treatment relatively well.   The patient experienced skin irritation as expected by the end of treatment.   Plan: The patient has completed radiation treatment. The patient will return to radiation oncology clinic for routine followup in one month. I advised the patient to call or return sooner if they have any questions or concerns related to their recovery or treatment. ________________________________  Jodelle Gross, M.D., Ph.D.

## 2019-07-15 ENCOUNTER — Encounter: Payer: Self-pay | Admitting: Physical Therapy

## 2019-07-15 ENCOUNTER — Other Ambulatory Visit: Payer: Self-pay

## 2019-07-15 ENCOUNTER — Ambulatory Visit: Payer: BC Managed Care – PPO | Admitting: Physical Therapy

## 2019-07-15 DIAGNOSIS — M6281 Muscle weakness (generalized): Secondary | ICD-10-CM | POA: Diagnosis not present

## 2019-07-15 DIAGNOSIS — C210 Malignant neoplasm of anus, unspecified: Secondary | ICD-10-CM

## 2019-07-15 NOTE — Patient Instructions (Signed)
Access Code: [email or web address]  URL: [email or web address]  Date: 07/15/2019  Prepared by: Earlie Counts   Exercises Seated Piriformis Stretch with Trunk Bend - 1 reps - 1 sets - 30 sec hold - 1x daily - 7x weekly Seated Hamstring Stretch - 1 reps - 1 sets - 30 sec hold - 1x daily - 7x weekly Deep Squat with Pelvic Floor Relaxation - 1 reps - 1 sets - 30 sec hold - 1x daily - 7x weekly Double Leg Hamstring Stretch at Wall - 1 reps - 1 sets - 1-2 min hold - 1x daily - 7x weekly Cat-Camel - 10 reps - 1 sets - 1x daily - 7x weekly Child's Pose Stretch - 1 reps - 1 sets - 1-2 min hold - 1x daily - 7x weekly Prone Press Up - 5 reps - 1 sets - 2-3 sec hold - 1x daily - 7x weekly Supine Pelvic Floor Contract and Release - 10 reps - 1 sets - 5 sec hold - 1x daily - 7x weekly Tandem Stance - 2 reps - 1 sets - 30 sec hold - 1x daily - 7x weekly Single Leg Stance - 2 reps - 1 sets - 15 sec hold - 1x daily - 7x weekly Single Leg Balance with Alternating Floor Reaches - 10 reps - 1 sets - 1x daily - 7x weekly Isometric Squat with Resistance Loop - 10 reps - 1 sets - 1x daily - 7x weekly Side Stepping with Resistance at Thighs - 10 reps - 2 sets - 1x daily - 7x weekly Supine Shoulder Horizontal Abduction with Resistance - 10 reps - 1 sets - 1x daily - 7x weekly Supine Shoulder External Rotation on Foam Roll with Theraband - 10 reps - 1 sets - 1x daily - 7x weekly Seated Shoulder Diagonal with Resistance - 10 reps - 1 sets - 1x daily - 7x weekly Hip Flexor Mobilization with Foam Roll - 10 reps - 1 sets - 1x daily                            - 7x weekly Piriformis Mobilization on Foam Roll - 10 reps - 1 sets - 1x daily - 7x weekly Sidelying IT Band Foam Roll Mobilization - 10 reps - 1 sets - 1x daily - 7x weekly Thoracic Extension Mobilization on Foam Roll - 10 reps - 1 sets - 1x daily - 7x weekly Thoracic Foam Roll Mobilization Backstroke - 10 reps - 1 sets - 1x daily - 7x weekly Thoracic Mobilization  on Foam Roll - 10 reps - 3 sets - 1x daily - 7x weekly Bird Dog - 10 reps - 2 sets - 1x daily - 7x weekly Single Leg Stance on Pillow - 3 reps - 1 sets - 15 sec hold - 1x daily - 7x weekly Foam Balance Tandem stance - 3 reps - 1 sets - 30 sec hold - 1x daily - 7x weekly  [facility] [address] [place], [place] [address] Phone # [phone] Fax [phone]

## 2019-07-15 NOTE — Therapy (Signed)
[facility] [address] [place], [place], [address] Phone: [phone]   Fax:  [phone]  Physical Therapy Treatment  Patient Details  Name: Leah Bailey MRN: [id number] Date of Birth: [birth year] Referring Provider (PT): Hayden Pedro, [place]   Encounter Date: 07/15/2019  PT End of Session - 07/15/19 1307    Visit Number  5    Date for PT Re-Evaluation  08/30/19    Authorization Type  BCBS    PT Start Time  1230    PT Stop Time  1308    PT Time Calculation (min)  38 min    Activity Tolerance  Patient tolerated treatment well;No increased pain    Behavior During Therapy  WFL for tasks assessed/performed       Past Medical History:  Diagnosis Date  . Herpes simplex disciform keratitis 10/20/2013  . Hypertension   . IBD (inflammatory bowel disease) - colitis 01/11/2018  . Nuclear sclerosis of both eyes 11/30/2014  . Squamous cell carcinoma of rectum ([place]) 01/13/2019  . UC (ulcerative colitis) [facility])     Past Surgical History:  Procedure Laterality Date  . ABDOMINAL HYSTERECTOMY    . BREAST CYST ASPIRATION Left 1983  . BREAST LUMPECTOMY Left   . COLONOSCOPY    . INCISION AND DRAINAGE PERIRECTAL ABSCESS N/A 01/15/2019   Procedure: INTERNAL DRAINAGE OF PERIRECTAL ABSCESS;  Surgeon: Jovita Kussmaul, MD;  Location: [facility];  Service: General;  Laterality: N/A;  . TUBAL LIGATION      There were no vitals filed for this visit.  Subjective Assessment - 07/15/19 1239    Subjective  I am ready for discharge.    Pertinent History  anal cancer with radiation and chemotherapy from 02/22/2019-03/27/2019    Patient Stated Goals  expand the vaginal canal    Currently in Pain?  No/denies    Multiple Pain Sites  No         [facility] PT Assessment - 07/15/19 0001      Assessment   Medical Diagnosis  C21.0 Malignant Neoplasm of anus    Referring Provider (PT)  Hayden Pedro, PA    Onset Date/Surgical Date   03/27/19    Prior Therapy  none      Precautions   Precautions  Other (comment)    Precaution Comments  anal cancer with chemotherapy and radiation      Restrictions   Weight Bearing Restrictions  No      Prior Function   Level of Independence  Independent    Leisure  build up endurance to exercise      Cognition   Overall Cognitive Status  Within Functional Limits for tasks assessed      AROM   Lumbar Flexion  full    Lumbar Extension  full    Lumbar - Right Side Bend  full    Lumbar - Left Side Bend  full    Lumbar - Right Rotation  full    Lumbar - Left Rotation  full      PROM   Right Hip External Rotation   65    Right Hip Internal Rotation   30    Left Hip External Rotation   65    Left Hip Internal Rotation   30      Strength   Right Hip ABduction  4/5    Left Hip Extension  4/5    Left Hip ABduction  4/5  Pelvic Floor Special Questions - 07/15/19 0001    Urinary Leakage  No    Fecal incontinence  No    Exam Type  Deferred        [facility] Adult PT Treatment/Exercise - 07/15/19 0001      Self-Care   Self-Care  Other Self-Care Comments    Other Self-Care Comments   educated patient on how to progress to the next size dilator, to still work on the elongation of the vaginal but ready for the biger size      Neuro Re-ed    Neuro Re-ed Details   single leg stance on foam, then single leg stance with eyes closed on foam; tandem stance on foam, then ith eyes closed      Lumbar Exercises: Stretches   Press Ups  10 reps      Lumbar Exercises: Aerobic   Elliptical  level 3  for 7 min while assessing patient and building endurance             PT Education - 07/15/19 1306    Education Details  Access Code: [email or web address]; reviewed on how to progress with dilator    Person(s) Educated  Patient    Methods  Explanation;Demonstration;Verbal cues;Handout    Comprehension  Returned demonstration;Verbalized understanding       PT Short Term  Goals - 06/24/19 1312      PT SHORT TERM GOAL #1   Title  independent with initial HEP    Time  4    Period  Weeks    Status  Achieved      PT SHORT TERM GOAL #2   Title  education on using the dilator with lubrincant    Time  4    Period  Weeks    Status  Achieved        PT Long Term Goals - 07/15/19 1309      PT LONG TERM GOAL #1   Title  independent with advanced HEP    Time  12    Period  Weeks    Status  Achieved      PT LONG TERM GOAL #2   Title  able to use the larges dilator with no increased of pain >/= 3/10 due to elongation of vaginal tissue    Baseline  on the second dilator with no pain and is ready for the next size    Time  12    Period  Weeks    Status  Partially Met      PT LONG TERM GOAL #3   Title  able to have penile penetration with no pain >/= 3/10 and performing relaxation breathing    Baseline  no pain    Time  12    Period  Weeks    Status  Achieved      PT LONG TERM GOAL #4   Title  able to stand on on leg for 15 seconds 3/3 times due to improved hip strength and balance to prevent future falls    Time  12    Period  Weeks    Status  Achieved            Plan - 07/15/19 1247    Clinical Impression Statement  Patient is using her dilators. Patient understands how to progress herself with the dilator and to increase the vaginal canal. Patient has met her goals. Patient is able to have penile penetration without pain. Patient is doing her exercise program and  working on increasing her endurance.    Personal Factors and Comorbidities  Comorbidity 1;Comorbidity 2;Fitness    Comorbidities  Abdominal hysterectomy; Squamous cell carcinoma of rectum 01/13/2019, concurrent chemotherapy and radiation from 02/22/2019-03/27/2019    Examination-Activity Limitations  Locomotion Level    Stability/Clinical Decision Making  Evolving/Moderate complexity    Rehab Potential  Excellent    PT Treatment/Interventions  Biofeedback;Electrical  Stimulation;Therapeutic activities;Therapeutic exercise;Neuromuscular re-education;Manual techniques;Patient/family education;Dry needling    PT Next Visit Plan  Discharge to HEP    PT Home Exercise Plan  Access Code: [email or web address]    Consulted and Agree with Plan of Care  Patient       Patient will benefit from skilled therapeutic intervention in order to improve the following deficits and impairments:  Decreased coordination, Decreased range of motion, Increased fascial restricitons, Decreased strength, Pain  Visit Diagnosis: Muscle weakness (generalized)  Anal cancer [facility])     Problem List Patient Active Problem List   Diagnosis Date Noted  . Perirectal abscess 01/15/2019  . Rectal pain 01/13/2019  . Malignant neoplasm of anus ([place]) 01/13/2019  . IBD (inflammatory bowel disease) - colitis 01/11/2018  . Hyperlipidemia 10/25/2017  . Hyperglycemia 10/25/2017  . Essential hypertension, benign 10/24/2017    Earlie Counts, PT 07/15/19 1:12 PM   [facility] [address] [place], [place], [address] Phone: [phone]   Fax:  [phone]  Name: Leah Bailey MRN: [id number] Date of Birth: 17-Dec-1955  PHYSICAL THERAPY DISCHARGE SUMMARY  Visits from Start of Care: 5  Current functional level related to goals / functional outcomes: See above.    Remaining deficits: See above.    Education / Equipment: HEP Plan: Patient agrees to discharge.  Patient goals were met. Patient is being discharged due to meeting the stated rehab goals.  Thank you for the referral. Earlie Counts, PT 07/15/19 1:12 PM  ?????

## 2019-07-16 NOTE — Progress Notes (Signed)
[place]   Telephone:([phone] Fax:([phone]) [phone]   Clinic Follow up Note   Patient Care Team: Harrison Mons, [place] as PCP - General (Family Medicine) Jovita Kussmaul, MD as Consulting Physician (General Surgery) Ileana Roup, MD as Consulting Physician (General Surgery) Kyung Rudd, MD as Consulting Physician ([place]) Truitt Merle, MD as Consulting Physician (Hematology) Placke, Dawn, RN (Inactive) as Oncology Nurse Navigator   I connected with Leah Bailey on 07/22/2019 at 10:40 AM EST by video enabled telemedicine visit and verified that I am speaking with the correct person using two identifiers.  I discussed the limitations, risks, security and privacy concerns of performing an evaluation and management service by telephone and the availability of in person appointments. I also discussed with the patient that there may be a patient responsible charge related to this service. The patient expressed understanding and agreed to proceed.   Patient's location:  Her home  Provider's location:  My Office   CHIEF COMPLAINT: F/u rectal cancer  SUMMARY OF ONCOLOGIC HISTORY: Oncology History Overview Note  Cancer Staging Squamous cell carcinoma of rectum [facility]) Staging form: Colon and Rectum, AJCC 8th Edition - Clinical stage from 02/04/2019: Stage IIIA (cT2, cN1a, cM0) - Signed by Truitt Merle, MD on 02/04/2019     Malignant neoplasm of anus ([place])  01/13/2019 Initial Diagnosis   Squamous cell carcinoma of rectum ([place])   01/14/2019 Imaging   CT Pelvis W Contrast 01/14/19 IMPRESSION: Perirectal abscess, at the left posterior aspect of the rectum, inseparable from the wall of the colon and the adjacent pelvic musculature. Largest diameter on cranial caudal imaging measures 4.8 cm.   Asymmetric thickening of the rectum associated with the abscess. A perforated rectal carcinoma is favored given the appearance, and correlation with physical exam as  well anoscopy/rigid sigmoidoscopy. Alternatively, the changes may reflect chronic inflammation in the setting of inflammatory bowel disease.   There are several small lymph nodes within the perirectal fat and along the left pelvis, the largest measuring 13 mm, potentially pathologic in the setting of carcinoma, or alternatively reactive.   01/15/2019 Initial Diagnosis   Diagnosis 01/15/19 Soft tissue, abscess, rectal wall - INVASIVE SQUAMOUS CELL CARCINOMA, MODERATELY TO POORLY DIFFERENTIATED.   02/03/2019 Imaging   CT Chest W Contrast 02/03/19  IMPRESSION: 1. Two nodules each measuring about 4 mm in long axis noted in the right upper lobe. One of these is subpleural. These are likely benign but given the context, surveillance imaging in 6-12 months time should be considered. Fleischner criteria for follow do not directly apply given the history of malignancy.   02/04/2019 Cancer Staging   Staging form: Colon and Rectum, AJCC 8th Edition - Clinical stage from 02/04/2019: Stage IIIA (cT2, cN1a, cM0) - Signed by Truitt Merle, MD on 02/04/2019   02/15/2019 PET scan   IMPRESSION: 1. Hypermetabolic eccentric rectal wall thickening with hypermetabolic left perirectal adenopathy. No evidence of distant metastatic disease. 2. Possible gallbladder sludge. 3. Left renal stone.   02/22/2019 - 03/22/2019 Chemotherapy   ChemoRT with Mitomycin/5FU week 1 and 5 starting 02/22/19 and ending 03/22/19   02/22/2019 - 04/02/2019 Radiation Therapy   ChemoRT with Dr. Sondra Come 02/22/19-04/02/19   07/19/2019 PET scan   IMPRESSION: 1. Interval marked improvement in the previously demonstrated hypermetabolic anorectal wall thickening and perirectal adenopathy. Mild residual activity at the anal verge is nonspecific and within physiologic limits. 2. No evidence of metastatic disease. 3. No acute findings.      CURRENT THERAPY:  Surveillance   INTERVAL HISTORY:  Leah Bailey is here for a follow up of  rectal cancer. They identified themselves by face to face video. She notes she is doing well. She has been feeling tired but trying to exercise more and going to pelvic floor therapy which she completed last week. She notes mild joint pain in her left hip. She notes her appetite and eating is back to baseline. She notes her energy is improving at least 60%. She notes she plans to f/u with Dr Dema Severin next month.    REVIEW OF SYSTEMS:   Constitutional: Denies fevers, chills or abnormal weight loss (+) Residual Fatigue  Eyes: Denies blurriness of vision Ears, nose, mouth, throat, and face: Denies mucositis or sore throat Respiratory: Denies cough, dyspnea or wheezes Cardiovascular: Denies palpitation, chest discomfort or lower extremity swelling Gastrointestinal:  Denies nausea, heartburn or change in bowel habits Skin: Denies abnormal skin rashes MSK: (+) mild left hip pain  Lymphatics: Denies new lymphadenopathy or easy bruising Neurological:Denies numbness, tingling or new weaknesses Behavioral/Psych: Mood is stable, no new changes  All other systems were reviewed with the patient and are negative.  MEDICAL HISTORY:  Past Medical History:  Diagnosis Date  . Herpes simplex disciform keratitis 10/20/2013  . Hypertension   . IBD (inflammatory bowel disease) - colitis 01/11/2018  . Nuclear sclerosis of both eyes 11/30/2014  . Squamous cell carcinoma of rectum ([place]) 01/13/2019  . UC (ulcerative colitis) ([place])     SURGICAL HISTORY: Past Surgical History:  Procedure Laterality Date  . ABDOMINAL HYSTERECTOMY    . BREAST CYST ASPIRATION Left 1983  . BREAST LUMPECTOMY Left   . COLONOSCOPY    . INCISION AND DRAINAGE PERIRECTAL ABSCESS N/A 01/15/2019   Procedure: INTERNAL DRAINAGE OF PERIRECTAL ABSCESS;  Surgeon: Jovita Kussmaul, MD;  Location: [facility];  Service: General;  Laterality: N/A;  . TUBAL LIGATION      I have reviewed the social history and family history with the patient and they are  unchanged from previous note.  ALLERGIES:  has No Known Allergies.  MEDICATIONS:  Current Outpatient Medications  Medication Sig Dispense Refill  . aspirin 81 MG tablet Take 81 mg by mouth daily.    . ferrous sulfate 325 (65 FE) MG EC tablet Take 650 mg by mouth daily with breakfast.     . lisinopril-hydrochlorothiazide (PRINZIDE,ZESTORETIC) 20-25 MG tablet Take 1 tablet by mouth daily. 90 tablet 3  . mesalamine (LIALDA) 1.2 g EC tablet TAKE 2 TABLETS (2.4 G TOTAL) BY MOUTH DAILY WITH BREAKFAST. 180 tablet 3  . Multiple Vitamins-Calcium (ONE-A-DAY WOMENS PO) Take 1 tablet by mouth daily.    . naproxen sodium (ALEVE) 220 MG tablet Take 220 mg by mouth daily as needed (pain).      No current facility-administered medications for this visit.    PHYSICAL EXAMINATION: ECOG PERFORMANCE STATUS: 1 - Symptomatic but completely ambulatory  No vitals taken today, Exam not performed today   LABORATORY DATA:  I have reviewed the data as listed CBC Latest Ref Rng & Units 06/21/2019 05/17/2019 04/19/2019  WBC 4.0 - 10.5 K/uL 3.1(L) 2.7(L) 2.8(L)  Hemoglobin 12.0 - 15.0 g/dL 10.9(L) 10.1(L) 8.0(L)  Hematocrit 36.0 - 46.0 % 34.0(L) 32.4(L) 26.0(L)  Platelets 150 - 400 K/uL 260 257 265     CMP Latest Ref Rng & Units 06/21/2019 05/17/2019 04/19/2019  Glucose 70 - 99 mg/dL 92 87 140(H)  BUN 8 - 23 mg/dL 17 12 9   Creatinine 0.44 - 1.00  mg/dL 0.76 0.72 0.73  Sodium 135 - 145 mmol/L 141 140 143  Potassium 3.5 - 5.1 mmol/L 4.5 4.3 3.8  Chloride 98 - 111 mmol/L 105 105 109  CO2 22 - 32 mmol/L 28 26 24   Calcium 8.9 - 10.3 mg/dL 9.4 9.0 8.9  Total Protein 6.5 - 8.1 g/dL 7.1 7.0 6.8  Total Bilirubin 0.3 - 1.2 mg/dL 0.2(L) <0.2(L) <0.2(L)  Alkaline Phos 38 - 126 U/L 74 73 73  AST 15 - 41 U/L 20 32 27  ALT 0 - 44 U/L 33 51(H) 44      RADIOGRAPHIC STUDIES: I have personally reviewed the radiological images as listed and agreed with the findings in the report. No results found.   ASSESSMENT &  PLAN:  Leah Bailey is a 64 y.o. female with   1.Squamous cell carcinoma of rectum, Ct2N1aM0 stage IIIA -She was diagnosed in 12/2018.Her CT scans shows low rectal wall thickeningwithabscessand surrounding lymphadenopathy, largest up to 7m. Chest scan also shows 2 tiny 457mright lung nodules which are likely benign.PET shows no distant metastasis. -She has completedconcurrent chemoRT with Mitomycin and 5FU 10/5 to 04/02/19. She overall tolerated moderately well. -She was seen by Dr. WhDema Severinn 05/2019, his rectal exam indicates excellent clinical response to treatment, no palpable mass. -I personally reviewed the images and discussed her PET from 07/19/19 with pt which shows interval marked improvement of hypermetabolic anorectal wall thickening and perirectal adenopathy. No evidence of metastatic disease. She hs had near complete response, no evidence of gross residual disease on recent exam  -Since treatment she continues to recover well. She has residual fatigue and mild left hip pain. Energy has improved to at least 60%. She is otherwise back to baseline.  -I encouraged her to continue exercise and eating well. She completed pelvic floor therapy last week.  -I discussed the risk of cancer recurrence in the future is up to 20%. I discussed the surveillance plan, which is a physical exam and lab test (including CBC, CMP ) every 3 months for the first 2 years, then every 6-12 months, and surveillance CT scan every 6-12 month for up to 5 year. She will f/u with Dr. WhDema Severinor anal scopes as well. Next visit in a few months  -I encouraged her to proceed with COVID19 precautions.  -F/u in 4 months    2. Anemia, GI Bleeding -Per patient she has had chronic mildly low anemia. She has been on oral ferrous sulfate.  -This has worsened lately secondary to her cancer and GI bleeding, asymptomatic.  -Iron levels previously normal.   3. Ulcerative Colitis/IBD, Diverticulosis  -Found  incidentally on 12/2017 screening colonoscopy  -On Lialda given by Dr GeCarlean Purl-Continue Prilosec -BMs have  Been regular since she has recovered from [place]   5. HTN -On Lisinopril-HCTZ, BPslightly low, she is asymptomatic, I told her to stop medication -I offered her IVF today, she declined  -she will monitor her BP at home   PLAN: -PET scan reviewed, near complete response  -Copy message to Dr. WhDema Severinnd Dr. KiEarney Hamburgshe will see Dr. WhDema Severinn a few months  -Survivorship Virtual visit on 09/20/19 with NP Lacie    No problem-specific Assessment & Plan notes found for this encounter.   No orders of the defined types were placed in this encounter.  I discussed the assessment and treatment plan with the patient. The patient was provided an opportunity to ask questions and all were answered. The patient agreed with  the plan and demonstrated an understanding of the instructions.  The patient was advised to call back or seek an in-person evaluation if the symptoms worsen or if the condition fails to improve as anticipated.  The total time spent in the appointment was 30 minutes.    Truitt Merle, MD 07/22/2019   I, Joslyn Devon, am acting as scribe for Truitt Merle, MD.   I have reviewed the above documentation for accuracy and completeness, and I agree with the above.

## 2019-07-19 ENCOUNTER — Ambulatory Visit (HOSPITAL_COMMUNITY)
Admission: RE | Admit: 2019-07-19 | Discharge: 2019-07-19 | Disposition: A | Discharge status: Home / Self Care | Payer: BC Managed Care – PPO | Source: Ambulatory Visit | Attending: Hematology | Admitting: Hematology

## 2019-07-19 ENCOUNTER — Other Ambulatory Visit: Payer: Self-pay

## 2019-07-19 DIAGNOSIS — C210 Malignant neoplasm of anus, unspecified: Secondary | ICD-10-CM | POA: Insufficient documentation

## 2019-07-19 LAB — GLUCOSE, CAPILLARY: Glucose-Capillary: 93 mg/dL (ref 70–99)

## 2019-07-19 MED ORDER — FLUDEOXYGLUCOSE F - 18 (FDG) INJECTION
7.5600 | Freq: Once | INTRAVENOUS | Status: AC | PRN
  Administered 2019-07-19: 7.56 via INTRAVENOUS

## 2019-07-22 ENCOUNTER — Encounter: Payer: Self-pay | Admitting: Hematology

## 2019-07-22 ENCOUNTER — Telehealth (HOSPITAL_BASED_OUTPATIENT_CLINIC_OR_DEPARTMENT_OTHER): Payer: BC Managed Care – PPO | Admitting: Hematology

## 2019-07-22 ENCOUNTER — Encounter: Payer: BC Managed Care – PPO | Admitting: Physical Therapy

## 2019-07-22 DIAGNOSIS — C210 Malignant neoplasm of anus, unspecified: Secondary | ICD-10-CM | POA: Diagnosis not present

## 2019-07-31 ENCOUNTER — Ambulatory Visit: Payer: BC Managed Care – PPO | Attending: Internal Medicine

## 2019-07-31 DIAGNOSIS — Z23 Encounter for immunization: Secondary | ICD-10-CM

## 2019-07-31 NOTE — Progress Notes (Signed)
   Covid-19 Vaccination Clinic  Name:  Leah Bailey    MRN: [id number] DOB: [birth year]  07/31/2019  [name] was observed post Covid-19 immunization for 15 minutes without incident. She was provided with Vaccine Information Sheet and instruction to access the V-Safe system.   [name] was instructed to call 911 with any severe reactions post vaccine: Marland Kitchen Difficulty breathing  . Swelling of face and throat  . A fast heartbeat  . A bad rash all over body  . Dizziness and weakness   Immunizations Administered    Name Date Dose VIS Date Route   Pfizer COVID-19 Vaccine 07/31/2019  8:44 AM 0.3 mL 04/30/2019 Intramuscular   Manufacturer: [place]   Lot: [id number]   [place]: [address]-5

## 2019-08-23 ENCOUNTER — Ambulatory Visit: Payer: BC Managed Care – PPO | Attending: Internal Medicine

## 2019-08-23 ENCOUNTER — Ambulatory Visit: Payer: BC Managed Care – PPO

## 2019-08-23 DIAGNOSIS — Z23 Encounter for immunization: Secondary | ICD-10-CM

## 2019-08-23 NOTE — Progress Notes (Signed)
   Covid-19 Vaccination Clinic  Name:  Mekhi Lascola    MRN: [id number] DOB: [birth year]  08/23/2019  Ms. Hughart was observed post Covid-19 immunization for 15 minutes without incident. She was provided with Vaccine Information Sheet and instruction to access the V-Safe system.   Ms. Freund was instructed to call 911 with any severe reactions post vaccine: Marland Kitchen Difficulty breathing  . Swelling of face and throat  . A fast heartbeat  . A bad rash all over body  . Dizziness and weakness   Immunizations Administered    Name Date Dose VIS Date Route   Pfizer COVID-19 Vaccine 08/23/2019  3:01 PM 0.3 mL 04/30/2019 Intramuscular   Manufacturer: [place]   Lot: [id number]   [place]: [address]-8

## 2019-09-16 NOTE — Progress Notes (Signed)
[place]   Telephone:(336) [phone] Fax:(336) [phone]   Clinic Follow up Note   Patient Care Team: Harrison Mons, [place] as PCP - General (Family Medicine) Jovita Kussmaul, MD as Consulting Physician (General Surgery) Ileana Roup, MD as Consulting Physician (General Surgery) Kyung Rudd, MD as Consulting Physician (Radiation Oncology) Truitt Merle, MD as Consulting Physician (Hematology) Virgina Evener, Dawn, RN (Inactive) as Oncology Nurse Navigator Alla Feeling, NP as Nurse Practitioner (Nurse Practitioner) 09/20/2019  Survivorship Clinic   I connected with Rosine Door on 09/20/19 at 9:41 AM EST by video enabled telemedicine visit and verified that I am speaking with the correct person using two identifiers.   I discussed the limitations, risks, security and privacy concerns of performing an evaluation and management service by telemedicine and the availability of in-person appointments. I also discussed with the patient that there may be a patient responsible charge related to this service. The patient expressed understanding and agreed to proceed.   Other persons participating in the visit and their role in the encounter: None  Patient's location: Home Provider's location: [facility] office   Chief Complaint: survivorship care plan   SUMMARY OF ONCOLOGIC HISTORY: Oncology History Overview Note  Cancer Staging Squamous cell carcinoma of rectum [facility]) Staging form: Colon and Rectum, AJCC 8th Edition - Clinical stage from 02/04/2019: Stage IIIA (cT2, cN1a, cM0) - Signed by Truitt Merle, MD on 02/04/2019     Malignant neoplasm of anus ([place])  01/13/2019 Initial Diagnosis   Squamous cell carcinoma of rectum ([place])   01/14/2019 Imaging   CT Pelvis W Contrast 01/14/19 IMPRESSION: Perirectal abscess, at the left posterior aspect of the rectum, inseparable from the wall of the colon and the adjacent pelvic musculature. Largest diameter on cranial caudal imaging measures  4.8 cm.   Asymmetric thickening of the rectum associated with the abscess. A perforated rectal carcinoma is favored given the appearance, and correlation with physical exam as well anoscopy/rigid sigmoidoscopy. Alternatively, the changes may reflect chronic inflammation in the setting of inflammatory bowel disease.   There are several small lymph nodes within the perirectal fat and along the left pelvis, the largest measuring 13 mm, potentially pathologic in the setting of carcinoma, or alternatively reactive.   01/15/2019 Initial Diagnosis   Diagnosis 01/15/19 Soft tissue, abscess, rectal wall - INVASIVE SQUAMOUS CELL CARCINOMA, MODERATELY TO POORLY DIFFERENTIATED.   02/03/2019 Imaging   CT Chest W Contrast 02/03/19  IMPRESSION: 1. Two nodules each measuring about 4 mm in long axis noted in the right upper lobe. One of these is subpleural. These are likely benign but given the context, surveillance imaging in 6-12 months time should be considered. Fleischner criteria for follow do not directly apply given the history of malignancy.   02/04/2019 Cancer Staging   Staging form: Colon and Rectum, AJCC 8th Edition - Clinical stage from 02/04/2019: Stage IIIA (cT2, cN1a, cM0) - Signed by Truitt Merle, MD on 02/04/2019   02/15/2019 PET scan   IMPRESSION: 1. Hypermetabolic eccentric rectal wall thickening with hypermetabolic left perirectal adenopathy. No evidence of distant metastatic disease. 2. Possible gallbladder sludge. 3. Left renal stone.   02/22/2019 - 03/22/2019 Chemotherapy   ChemoRT with Mitomycin/5FU week 1 and 5 starting 02/22/19 and ending 03/22/19   02/22/2019 - 04/02/2019 Radiation Therapy   ChemoRT with Dr. Sondra Come 02/22/19-04/02/19   07/19/2019 PET scan   IMPRESSION: 1. Interval marked improvement in the previously demonstrated hypermetabolic anorectal wall thickening and perirectal adenopathy. Mild residual activity at the anal verge  is nonspecific and within physiologic  limits. 2. No evidence of metastatic disease. 3. No acute findings.   09/20/2019 Survivorship   SCP delivered via virtual appointment by Cira Rue, NP      CURRENT THERAPY: Surveillance  INTERVAL HISTORY: Ms. Rake presents for virtual SCP visit. She is feeling well in general. She continues to gain strength, she is walking daily, fatigue has improved. She denies rectal or anal pain. She does have mild hip pains but not limiting function and activity. She thinks this is from radiation. She completed official pelvic floor PT but continues to do exercises at home. She has more normal sex life with her husband now, "not quite normal" but denies painful intercourse. Denies vaginal or rectal bleeding. She has normal BM 1-2 times daily. She rarely has urinary and fecal incontinence especially if she does not get to bathroom when the urge hits. Exercises are helping. Denies dysuria. Appetite is normal. No n/v/c/d. Denies fever, chills, cough, chest pain, or other concerns.    ONCOLOGY TREATMENT TEAM:  1. Surgeon:  Dr. Dema Severin at [facility] 2. Medical Oncologist: Dr. Burr Medico  3. Radiation Oncologist: Dr. Lisbeth Renshaw  MEDICAL HISTORY:  Past Medical History:  Diagnosis Date  . Herpes simplex disciform keratitis 10/20/2013  . Hypertension   . IBD (inflammatory bowel disease) - colitis 01/11/2018  . Nuclear sclerosis of both eyes 11/30/2014  . Squamous cell carcinoma of rectum ([place]) 01/13/2019  . UC (ulcerative colitis) ([place])     SURGICAL HISTORY: Past Surgical History:  Procedure Laterality Date  . ABDOMINAL HYSTERECTOMY    . BREAST CYST ASPIRATION Left 1983  . BREAST LUMPECTOMY Left   . COLONOSCOPY    . INCISION AND DRAINAGE PERIRECTAL ABSCESS N/A 01/15/2019   Procedure: INTERNAL DRAINAGE OF PERIRECTAL ABSCESS;  Surgeon: Jovita Kussmaul, MD;  Location: [facility];  Service: General;  Laterality: N/A;  . TUBAL LIGATION      I have reviewed the social history and family history with the  patient and they are unchanged from previous note.  ALLERGIES:  has No Known Allergies.  MEDICATIONS:  Current Outpatient Medications  Medication Sig Dispense Refill  . aspirin 81 MG tablet Take 81 mg by mouth daily.    . ferrous sulfate 325 (65 FE) MG EC tablet Take 650 mg by mouth daily with breakfast.     . lisinopril-hydrochlorothiazide (PRINZIDE,ZESTORETIC) 20-25 MG tablet Take 1 tablet by mouth daily. 90 tablet 3  . mesalamine (LIALDA) 1.2 g EC tablet TAKE 2 TABLETS (2.4 G TOTAL) BY MOUTH DAILY WITH BREAKFAST. 180 tablet 3  . Multiple Vitamins-Calcium (ONE-A-DAY WOMENS PO) Take 1 tablet by mouth daily.    . naproxen sodium (ALEVE) 220 MG tablet Take 220 mg by mouth daily as needed (pain).      No current facility-administered medications for this visit.   Oncology family history Family History  Problem Relation Age of Onset  . Diabetes Mother   . Breast cancer Mother 34  . Heart attack Father   . Heart disease Father   . Diabetes Sister   . Diabetes Brother   . Diabetes Sister   . Healthy Son   . Healthy Son   . Diabetes Brother   . Breast cancer Cousin   . Colon cancer Neg Hx   . Esophageal cancer Neg Hx   . Stomach cancer Neg Hx   . Rectal cancer Neg Hx    Genetic testing: None  PHYSICAL EXAMINATION: ECOG PERFORMANCE STATUS: 1 - Symptomatic  but completely ambulatory  There were no vitals filed for this visit. There were no vitals filed for this visit.  Patient appears well, breathing is non labored. Speech is clear. Mood and affect appear normal.   LABORATORY DATA:  No labs for this visit.   RADIOGRAPHIC STUDIES: I have personally reviewed the radiological images as listed and agreed with the findings in the report. No results found.   ASSESSMENT & PLAN:   Ms.. Cho is a pleasant 64 y.o. 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  She presents to  the Survivorship Clinic for our initial meeting and routine follow-up post-completion of treatment for anal cancer.    1. 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 Matsunaga is continuing to recover from definitive treatment for anal cancer. She has some urinary and fecal incontinence that is not uncommon during the recovery phase. This is improved by pelvic floor exercises. She will follow-up with her medical oncologist, Dr. Burr Medico in 6-8 weeks with history and physical exam per surveillance protocol.  She will continue her routine f/u with anoscopy with Dr. Dema Severin, last seen in 05/2019. Today, a comprehensive survivorship care plan and treatment summary was reviewed with the patient today detailing her anal cancer diagnosis, treatment course, potential late/long-term effects of treatment, appropriate follow-up care with recommendations for the future, and patient education resources.  We reviewed s/sx of recurrence and she knows what to watch for. A copy of this summary, along with a letter will be sent to the patient's primary care provider via In Basket message after today's visit.    2. Cancer screening:  Ms. Gotcher should continue age-appropriate cancer screening for skin cancers, colon cancer, breast, and gynecologic cancers.  The information and recommendations are listed on the patient's comprehensive care plan/treatment summary and were reviewed in detail with the patient.    3. Health maintenance and wellness promotion: Ms. Pecor was encouraged to consume 5-7 servings of fruits and vegetables per day. We reviewed the "Nutrition Rainbow" handout, as well as the handout "Take Control of Your Health and Reduce Your Cancer Risk" from the [place].  She was also encouraged to engage in moderate to vigorous exercise for 30 minutes per day most days of the week. We discussed the [employer] fitness program, which is designed for cancer survivors to help them become more  physically fit after cancer treatments.  She was instructed to limit her alcohol consumption and continue to abstain from tobacco use.     4. Support services/counseling: It is not uncommon for this period of the patient's cancer care trajectory to be one of many emotions and stressors.  We discussed how this can be increasingly difficult during the times of quarantine and social distancing due to the COVID-19 pandemic.   She was given information regarding our available services and encouraged to contact me with any questions or for help enrolling in any of our support group/programs.    Follow up instructions:    -Return to cancer center for lab and physical exam in 6-8 weeks  -will cc note to Dr. Dema Severin to plan next anoscopy  -She is welcome to return back to the Survivorship Clinic at any time; no additional follow-up needed at this time.  -Consider referral back to survivorship as a long-term survivor for continued surveillance  The patient was provided an opportunity to ask questions and all were answered.  The patient agreed with the plan and demonstrated an understanding of the instructions.  The patient was advised to call back or seek an in-person evaluation if the symptoms worsen or if the condition fails to improve as anticipated.  I provided 16 minutes of face-to-face (video enabled) time during this encounter.     Alla Feeling, NP 09/20/19

## 2019-09-20 ENCOUNTER — Inpatient Hospital Stay: Payer: BC Managed Care – PPO | Attending: Hematology | Admitting: Nurse Practitioner

## 2019-09-20 ENCOUNTER — Encounter: Payer: Self-pay | Admitting: Nurse Practitioner

## 2019-09-20 DIAGNOSIS — C210 Malignant neoplasm of anus, unspecified: Secondary | ICD-10-CM

## 2019-09-21 ENCOUNTER — Telehealth: Payer: Self-pay | Admitting: Nurse Practitioner

## 2019-09-21 NOTE — Telephone Encounter (Signed)
Scheduled appt per 5/3 los.  Spoke with pt and she is aware of the appt date and time.

## 2019-09-28 ENCOUNTER — Telehealth: Payer: Self-pay | Admitting: Hematology

## 2019-09-28 NOTE — Telephone Encounter (Signed)
Moved appt per md on pal.  Called pt and she was okay with rescheduling and requested to be rescheduled in July.  Scheduled lab and f/u in July.  Pt aware of her new appt date and time

## 2019-11-08 ENCOUNTER — Other Ambulatory Visit: Payer: BC Managed Care – PPO

## 2019-11-08 ENCOUNTER — Ambulatory Visit: Payer: BC Managed Care – PPO | Admitting: Hematology

## 2019-11-17 NOTE — Progress Notes (Signed)
[place]   Telephone:(336) [phone] Fax:(336) [phone]   Clinic Follow up Note   Patient Care Team: Harrison Mons, PA as PCP - General (Family Medicine) Jovita Kussmaul, MD as Consulting Physician (General Surgery) Ileana Roup, MD as Consulting Physician (General Surgery) Kyung Rudd, MD as Consulting Physician (Radiation Oncology) Truitt Merle, MD as Consulting Physician (Hematology) Virgina Evener, Dawn, RN (Inactive) as Oncology Nurse Navigator Alla Feeling, NP as Nurse Practitioner (Nurse Practitioner)  Date of Service:  11/24/2019  CHIEF COMPLAINT: F/u rectal cancer  SUMMARY OF ONCOLOGIC HISTORY: Oncology History Overview Note  Cancer Staging Squamous cell carcinoma of rectum [facility]) Staging form: Colon and Rectum, AJCC 8th Edition - Clinical stage from 02/04/2019: Stage IIIA (cT2, cN1a, cM0) - Signed by Truitt Merle, MD on 02/04/2019     Malignant neoplasm of anus (Leah Bailey)  01/13/2019 Initial Diagnosis   Squamous cell carcinoma of rectum (Leah Bailey)   01/14/2019 Imaging   CT Pelvis W Contrast 01/14/19 IMPRESSION: Perirectal abscess, at the left posterior aspect of the rectum, inseparable from the wall of the colon and the adjacent pelvic musculature. Largest diameter on cranial caudal imaging measures 4.8 cm.   Asymmetric thickening of the rectum associated with the abscess. A perforated rectal carcinoma is favored given the appearance, and correlation with physical exam as well anoscopy/rigid sigmoidoscopy. Alternatively, the changes may reflect chronic inflammation in the setting of inflammatory bowel disease.   There are several small lymph nodes within the perirectal fat and along the left pelvis, the largest measuring 13 mm, potentially pathologic in the setting of carcinoma, or alternatively reactive.   01/15/2019 Initial Diagnosis   Diagnosis 01/15/19 Soft tissue, abscess, rectal wall - INVASIVE SQUAMOUS CELL CARCINOMA, MODERATELY TO POORLY  DIFFERENTIATED.   02/03/2019 Imaging   CT Chest W Contrast 02/03/19  IMPRESSION: 1. Two nodules each measuring about 4 mm in long axis noted in the right upper lobe. One of these is subpleural. These are likely benign but given the context, surveillance imaging in 6-12 months time should be considered. Fleischner criteria for follow do not directly apply given the history of malignancy.   02/04/2019 Cancer Staging   Staging form: Colon and Rectum, AJCC 8th Edition - Clinical stage from 02/04/2019: Stage IIIA (cT2, cN1a, cM0) - Signed by Truitt Merle, MD on 02/04/2019   02/15/2019 PET scan   IMPRESSION: 1. Hypermetabolic eccentric rectal wall thickening with hypermetabolic left perirectal adenopathy. No evidence of distant metastatic disease. 2. Possible gallbladder sludge. 3. Left renal stone.   02/22/2019 - 03/22/2019 Chemotherapy   ChemoRT with Mitomycin/5FU week 1 and 5 starting 02/22/19 and ending 03/22/19   02/22/2019 - 04/02/2019 Radiation Therapy   ChemoRT with Dr. Sondra Come 02/22/19-04/02/19   07/19/2019 PET scan   IMPRESSION: 1. Interval marked improvement in the previously demonstrated hypermetabolic anorectal wall thickening and perirectal adenopathy. Mild residual activity at the anal verge is nonspecific and within physiologic limits. 2. No evidence of metastatic disease. 3. No acute findings.   09/20/2019 Survivorship   SCP delivered via virtual appointment by Cira Rue, NP       CURRENT THERAPY:  Surveillance  INTERVAL HISTORY:  Leah Bailey is here for a follow up of rectal cancer. She presents to the clinic alone. She notes she is doing well. She notes she has residual fatigue, Occasional SOB and overall able to function without problems. She denies Neuropathy, rectal pain, nausea or bloating and has regular BM without bleeding. She was seen by Dr Dema Severin in 04/2019. 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    REVIEW OF SYSTEMS:   Constitutional: Denies fevers, chills or abnormal weight loss Eyes: Denies blurriness of vision Ears, nose, mouth, throat, and face: Denies mucositis or sore throat Respiratory: Denies cough, dyspnea or wheezes Cardiovascular: Denies palpitation, chest discomfort or lower extremity swelling Gastrointestinal:  Denies nausea, heartburn or change in bowel habits Skin: Denies abnormal skin rashes Lymphatics: Denies Leah lymphadenopathy or easy bruising Neurological:Denies numbness, tingling or Leah weaknesses Behavioral/Psych: Mood is stable, no Leah changes  All other systems were reviewed with the patient and are negative.  MEDICAL HISTORY:  Past Medical History:  Diagnosis Date  . Herpes simplex disciform keratitis 10/20/2013  . Hypertension   . IBD (inflammatory bowel disease) - colitis 01/11/2018  . Nuclear sclerosis of both eyes 11/30/2014  . Squamous cell carcinoma of rectum (Leah Bailey) 01/13/2019  . UC (ulcerative colitis) (Leah Bailey)     SURGICAL HISTORY: Past Surgical History:  Procedure Laterality Date  . ABDOMINAL HYSTERECTOMY    . BREAST CYST ASPIRATION Left 1983  . BREAST LUMPECTOMY Left   . COLONOSCOPY    . INCISION AND DRAINAGE PERIRECTAL ABSCESS N/A 01/15/2019   Procedure: INTERNAL DRAINAGE OF PERIRECTAL ABSCESS;  Surgeon: Jovita Kussmaul, MD;  Location: [facility];  Service: General;  Laterality: N/A;  . TUBAL LIGATION      I have reviewed the social history and family history with the patient and they are unchanged from previous note.  ALLERGIES:  has No Known Allergies.  MEDICATIONS:  Current Outpatient Medications  Medication Sig Dispense Refill  . aspirin 81 MG tablet Take 81 mg by mouth daily.    . ferrous sulfate 325 (65 FE) MG EC tablet Take 650 mg by mouth daily with breakfast.     . lisinopril-hydrochlorothiazide (PRINZIDE,ZESTORETIC) 20-25 MG tablet Take 1 tablet by mouth daily. 90 tablet 3  . Multiple  Vitamins-Calcium (ONE-A-DAY WOMENS PO) Take 1 tablet by mouth daily.    . naproxen sodium (ALEVE) 220 MG tablet Take 220 mg by mouth daily as needed (pain).      No current facility-administered medications for this visit.    PHYSICAL EXAMINATION: ECOG PERFORMANCE STATUS: 1 - Symptomatic but completely ambulatory  Vitals:   11/24/19 1137  BP: 137/83  Pulse: 67  Resp: 18  Temp: 98.3 F (36.8 C)  SpO2: 100%   Filed Weights   11/24/19 1137  Weight: 169 lb (76.7 kg)    GENERAL:alert, no distress and comfortable SKIN: skin color, texture, turgor are normal, no rashes or significant lesions EYES: normal, Conjunctiva are pink and non-injected, sclera clear  NECK: supple, thyroid normal size, non-tender, without nodularity LYMPH:  no palpable lymphadenopathy in the cervical, axillary or inguinal LUNGS: clear to auscultation and percussion with normal breathing effort HEART: regular rate & rhythm and no murmurs and no lower extremity edema ABDOMEN:abdomen soft, non-tender and normal bowel sounds Musculoskeletal:no cyanosis of digits and no clubbing  NEURO: alert & oriented x 3 with fluent speech, no focal motor/sensory deficits RECTAL: (+) External hemorrhoids or skin tag. No palpable mass. No blood on glove. Black stool present. Benign exam    LABORATORY DATA:  I have reviewed the data as listed CBC Latest Ref Rng & Units 11/24/2019 06/21/2019 05/17/2019  WBC 4.0 - 10.5 K/uL 2.8(L) 3.1(L) 2.7(L)  Hemoglobin 12.0 - 15.0 g/dL 10.9(L) 10.9(L) 10.1(L)  Hematocrit 36 -  46 % 34.1(L) 34.0(L) 32.4(L)  Platelets 150 - 400 K/uL 251 260 257     CMP Latest Ref Rng & Units 11/24/2019 06/21/2019 05/17/2019  Glucose 70 - 99 mg/dL 96 92 87  BUN 8 - 23 mg/dL 12 17 12   Creatinine 0.44 - 1.00 mg/dL 0.82 0.76 0.72  Sodium 135 - 145 mmol/L 140 141 140  Potassium 3.5 - 5.1 mmol/L 5.0 4.5 4.3  Chloride 98 - 111 mmol/L 108 105 105  CO2 22 - 32 mmol/L 27 28 26   Calcium 8.9 - 10.3 mg/dL 9.2 9.4 9.0    Total Protein 6.5 - 8.1 g/dL 7.2 7.1 7.0  Total Bilirubin 0.3 - 1.2 mg/dL 0.2(L) 0.2(L) <0.2(L)  Alkaline Phos 38 - 126 U/L 95 74 73  AST 15 - 41 U/L 20 20 32  ALT 0 - 44 U/L 33 33 51(H)      RADIOGRAPHIC STUDIES: I have personally reviewed the radiological images as listed and agreed with the findings in the report. No results found.   ASSESSMENT & PLAN:  Leah Bailey is a 64 y.o. female with   1.Squamous cell carcinoma of rectum, cT2N1aM0 stage IIIA -She was diagnosed in 12/2018.Her CT scans shows low rectal wall thickeningwithabscessand surrounding lymphadenopathy, largest up to 59m. Chest scan also shows 2 tiny 423mright lung nodules which are likely benign.PET shows no distant metastasis. -She has completedconcurrent chemoRT with Mitomycin and 5FU10/5-11/13/20. PET from 07/2019 shows near complete response to treatment  -She is currently on Surveillance.  -She is clinically doing well with no residual effects from prior treatment except fatigue and right hip pain. This is manageable.  -Labs reviewed, CBC and CMP WNL except WBC 2.8, Hg 10.9, ANC 1.6. Physical exam benign. --Continue Surveillance. She will continue to f/u with Dr WhDema Severinor anal scope and exam in 3-4 months. F/u with me in 6 months with next CT Scan.  -I encouraged her to watch for concerning symptoms of cancer recurrence.   2. Anemia, GI Bleeding -Per patient she has had chronic mildly low anemia. She has been on oral ferrous sulfate.  -Iron panel normal, Ferritin still pending, Hg 10.9 (11/24/19). She is fine to stop oral iron.   3. Ulcerative Colitis/IBD, Diverticulosis  -Found incidentally on 12/2017 screening colonoscopy  -On Lialda given by Dr GeCarlean PurlContinue Prilosec  4. HTN -BP has been normal lately.    PLAN: -F/u in 6 months with lab and CT CAP w contrast a few days before  -I encourage her to f/u with Dr. WhDema Severinn 3 months     No problem-specific Assessment & Plan  notes found for this encounter.   Orders Placed This Encounter  Procedures  . CT Abdomen Pelvis W Contrast    Standing Status:   Future    Standing Expiration Date:   11/23/2020    Order Specific Question:   If indicated for the ordered procedure, I authorize the administration of contrast media per Radiology protocol    Answer:   Yes    Order Specific Question:   [facility]    [facility]  Order Specific Question:   Release to patient    Answer:   Immediate    Order Specific Question:   Is Oral Contrast requested for this exam?    Answer:   Yes, Per Radiology protocol    Order Specific Question:   Radiology Contrast Protocol - do NOT remove file path    Answer:   \\  [email or web address]  . CT Chest W Contrast    Standing Status:   Future    Standing Expiration Date:   11/23/2020    Order Specific Question:   If indicated for the ordered procedure, I authorize the administration of contrast media per Radiology protocol    Answer:   Yes    Order Specific Question:   Preferred imaging location?    Answer:   [facility]    Order Specific Question:   Radiology Contrast Protocol - do NOT remove file path    Answer:   \\[email or web address]   All questions were answered. The patient knows to call the clinic with any problems, questions or concerns. No barriers to learning was detected. The total time spent in the appointment was 30 minutes.     Truitt Merle, MD 11/24/2019   I, Joslyn Devon, am acting as scribe for Truitt Merle, MD.   I have reviewed the above documentation for accuracy and completeness, and I agree with the above.

## 2019-11-24 ENCOUNTER — Inpatient Hospital Stay: Payer: BC Managed Care – PPO | Admitting: Hematology

## 2019-11-24 ENCOUNTER — Inpatient Hospital Stay: Payer: BC Managed Care – PPO | Attending: Hematology

## 2019-11-24 ENCOUNTER — Encounter: Payer: Self-pay | Admitting: Hematology

## 2019-11-24 ENCOUNTER — Other Ambulatory Visit: Payer: Self-pay

## 2019-11-24 ENCOUNTER — Telehealth: Payer: Self-pay | Admitting: Hematology

## 2019-11-24 VITALS — BP 137/83 | HR 67 | Temp 98.3°F | Resp 18 | Ht 65.0 in | Wt 169.0 lb

## 2019-11-24 DIAGNOSIS — I10 Essential (primary) hypertension: Secondary | ICD-10-CM | POA: Diagnosis not present

## 2019-11-24 DIAGNOSIS — K922 Gastrointestinal hemorrhage, unspecified: Secondary | ICD-10-CM | POA: Diagnosis not present

## 2019-11-24 DIAGNOSIS — K5190 Ulcerative colitis, unspecified, without complications: Secondary | ICD-10-CM | POA: Insufficient documentation

## 2019-11-24 DIAGNOSIS — K611 Rectal abscess: Secondary | ICD-10-CM | POA: Diagnosis not present

## 2019-11-24 DIAGNOSIS — R0602 Shortness of breath: Secondary | ICD-10-CM | POA: Diagnosis not present

## 2019-11-24 DIAGNOSIS — C210 Malignant neoplasm of anus, unspecified: Secondary | ICD-10-CM | POA: Diagnosis not present

## 2019-11-24 DIAGNOSIS — C20 Malignant neoplasm of rectum: Secondary | ICD-10-CM | POA: Insufficient documentation

## 2019-11-24 DIAGNOSIS — D649 Anemia, unspecified: Secondary | ICD-10-CM | POA: Insufficient documentation

## 2019-11-24 DIAGNOSIS — N200 Calculus of kidney: Secondary | ICD-10-CM | POA: Diagnosis not present

## 2019-11-24 DIAGNOSIS — Z79899 Other long term (current) drug therapy: Secondary | ICD-10-CM | POA: Diagnosis not present

## 2019-11-24 DIAGNOSIS — R5383 Other fatigue: Secondary | ICD-10-CM | POA: Diagnosis not present

## 2019-11-24 LAB — IRON AND TIBC
Iron: 77 ug/dL (ref 41–142)
Saturation Ratios: 28 % (ref 21–57)
TIBC: 274 ug/dL (ref 236–444)
UIBC: 196 ug/dL (ref 120–384)

## 2019-11-24 LAB — CBC WITH DIFFERENTIAL (CANCER CENTER ONLY)
Abs Immature Granulocytes: 0.01 10*3/uL (ref 0.00–0.07)
Basophils Absolute: 0 10*3/uL (ref 0.0–0.1)
Basophils Relative: 1 %
Eosinophils Absolute: 0 10*3/uL (ref 0.0–0.5)
Eosinophils Relative: 1 %
HCT: 34.1 % — ABNORMAL LOW (ref 36.0–46.0)
Hemoglobin: 10.9 g/dL — ABNORMAL LOW (ref 12.0–15.0)
Immature Granulocytes: 0 %
Lymphocytes Relative: 28 %
Lymphs Abs: 0.8 10*3/uL (ref 0.7–4.0)
MCH: 29.8 pg (ref 26.0–34.0)
MCHC: 32 g/dL (ref 30.0–36.0)
MCV: 93.2 fL (ref 80.0–100.0)
Monocytes Absolute: 0.3 10*3/uL (ref 0.1–1.0)
Monocytes Relative: 12 %
Neutro Abs: 1.6 10*3/uL — ABNORMAL LOW (ref 1.7–7.7)
Neutrophils Relative %: 58 %
Platelet Count: 251 10*3/uL (ref 150–400)
RBC: 3.66 MIL/uL — ABNORMAL LOW (ref 3.87–5.11)
RDW: 13.5 % (ref 11.5–15.5)
WBC Count: 2.8 10*3/uL — ABNORMAL LOW (ref 4.0–10.5)
nRBC: 0 % (ref 0.0–0.2)

## 2019-11-24 LAB — CMP (CANCER CENTER ONLY)
ALT: 33 U/L (ref 0–44)
AST: 20 U/L (ref 15–41)
Albumin: 3.9 g/dL (ref 3.5–5.0)
Alkaline Phosphatase: 95 U/L (ref 38–126)
Anion gap: 5 (ref 5–15)
BUN: 12 mg/dL (ref 8–23)
CO2: 27 mmol/L (ref 22–32)
Calcium: 9.2 mg/dL (ref 8.9–10.3)
Chloride: 108 mmol/L (ref 98–111)
Creatinine: 0.82 mg/dL (ref 0.44–1.00)
GFR, Est AFR Am: >60 mL/min (ref >60)
GFR, Estimated: >60 mL/min (ref >60)
Glucose, Bld: 96 mg/dL (ref 70–99)
Potassium: 5 mmol/L (ref 3.5–5.1)
Sodium: 140 mmol/L (ref 135–145)
Total Bilirubin: 0.2 mg/dL — ABNORMAL LOW (ref 0.3–1.2)
Total Protein: 7.2 g/dL (ref 6.5–8.1)

## 2019-11-24 LAB — FERRITIN: Ferritin: 360 ng/mL — ABNORMAL HIGH (ref 11–307)

## 2019-11-24 NOTE — Telephone Encounter (Signed)
Scheduled per 7/7 los. Printed avs and calendar for pt

## 2020-03-09 ENCOUNTER — Ambulatory Visit: Payer: BC Managed Care – PPO | Attending: Internal Medicine

## 2020-03-09 DIAGNOSIS — Z23 Encounter for immunization: Secondary | ICD-10-CM

## 2020-03-09 NOTE — Progress Notes (Signed)
   Covid-19 Vaccination Clinic  Name:  Leah Bailey    MRN: [id number] DOB: 02/01/1956  03/09/2020  [name] was observed post Covid-19 immunization for 15 minutes without incident. She was provided with Vaccine Information Sheet and instruction to access the V-Safe system.   [name] was instructed to call 911 with any severe reactions post vaccine: Marland Kitchen Difficulty breathing  . Swelling of face and throat  . A fast heartbeat  . A bad rash all over body  . Dizziness and weakness

## 2020-04-03 ENCOUNTER — Other Ambulatory Visit: Payer: Self-pay | Admitting: Physician Assistant

## 2020-04-03 DIAGNOSIS — Z1231 Encounter for screening mammogram for malignant neoplasm of breast: Secondary | ICD-10-CM

## 2020-05-02 ENCOUNTER — Other Ambulatory Visit: Payer: Self-pay

## 2020-05-02 ENCOUNTER — Inpatient Hospital Stay: Payer: BC Managed Care – PPO | Attending: Nurse Practitioner

## 2020-05-02 DIAGNOSIS — K5190 Ulcerative colitis, unspecified, without complications: Secondary | ICD-10-CM | POA: Diagnosis not present

## 2020-05-02 DIAGNOSIS — C20 Malignant neoplasm of rectum: Secondary | ICD-10-CM

## 2020-05-02 DIAGNOSIS — D709 Neutropenia, unspecified: Secondary | ICD-10-CM | POA: Diagnosis not present

## 2020-05-02 DIAGNOSIS — Z85048 Personal history of other malignant neoplasm of rectum, rectosigmoid junction, and anus: Secondary | ICD-10-CM | POA: Diagnosis not present

## 2020-05-02 DIAGNOSIS — I10 Essential (primary) hypertension: Secondary | ICD-10-CM | POA: Diagnosis not present

## 2020-05-02 DIAGNOSIS — D649 Anemia, unspecified: Secondary | ICD-10-CM | POA: Diagnosis not present

## 2020-05-02 LAB — CBC WITH DIFFERENTIAL (CANCER CENTER ONLY)
Abs Immature Granulocytes: 0 10*3/uL (ref 0.00–0.07)
Basophils Absolute: 0 10*3/uL (ref 0.0–0.1)
Basophils Relative: 1 %
Eosinophils Absolute: 0 10*3/uL (ref 0.0–0.5)
Eosinophils Relative: 1 %
HCT: 34 % — ABNORMAL LOW (ref 36.0–46.0)
Hemoglobin: 11.1 g/dL — ABNORMAL LOW (ref 12.0–15.0)
Immature Granulocytes: 0 %
Lymphocytes Relative: 31 %
Lymphs Abs: 0.8 10*3/uL (ref 0.7–4.0)
MCH: 29.3 pg (ref 26.0–34.0)
MCHC: 32.6 g/dL (ref 30.0–36.0)
MCV: 89.7 fL (ref 80.0–100.0)
Monocytes Absolute: 0.3 10*3/uL (ref 0.1–1.0)
Monocytes Relative: 10 %
Neutro Abs: 1.5 10*3/uL — ABNORMAL LOW (ref 1.7–7.7)
Neutrophils Relative %: 57 %
Platelet Count: 256 10*3/uL (ref 150–400)
RBC: 3.79 MIL/uL — ABNORMAL LOW (ref 3.87–5.11)
RDW: 13.1 % (ref 11.5–15.5)
WBC Count: 2.6 10*3/uL — ABNORMAL LOW (ref 4.0–10.5)
nRBC: 0 % (ref 0.0–0.2)

## 2020-05-02 LAB — CMP (CANCER CENTER ONLY)
ALT: 23 U/L (ref 0–44)
AST: 14 U/L — ABNORMAL LOW (ref 15–41)
Albumin: 3.8 g/dL (ref 3.5–5.0)
Alkaline Phosphatase: 80 U/L (ref 38–126)
Anion gap: 6 (ref 5–15)
BUN: 11 mg/dL (ref 8–23)
CO2: 27 mmol/L (ref 22–32)
Calcium: 9.3 mg/dL (ref 8.9–10.3)
Chloride: 107 mmol/L (ref 98–111)
Creatinine: 0.77 mg/dL (ref 0.44–1.00)
GFR, Estimated: >60 mL/min (ref >60)
Glucose, Bld: 96 mg/dL (ref 70–99)
Potassium: 4.1 mmol/L (ref 3.5–5.1)
Sodium: 140 mmol/L (ref 135–145)
Total Bilirubin: 0.4 mg/dL (ref 0.3–1.2)
Total Protein: 7.3 g/dL (ref 6.5–8.1)

## 2020-05-02 LAB — IRON AND TIBC
Iron: 92 ug/dL (ref 41–142)
Saturation Ratios: 33 % (ref 21–57)
TIBC: 280 ug/dL (ref 236–444)
UIBC: 188 ug/dL (ref 120–384)

## 2020-05-02 LAB — FERRITIN: Ferritin: 351 ng/mL — ABNORMAL HIGH (ref 11–307)

## 2020-05-03 NOTE — Progress Notes (Signed)
[place]   Telephone:(336) [phone] Fax:(336) [phone]   Clinic Follow up Note   Patient Care Team: Harrison Mons, PA as PCP - General (Family Medicine) Jovita Kussmaul, MD as Consulting Physician (General Surgery) Ileana Roup, MD as Consulting Physician (General Surgery) Kyung Rudd, MD as Consulting Physician (Radiation Oncology) Truitt Merle, MD as Consulting Physician (Hematology) Virgina Evener, Dawn, RN (Inactive) as Oncology Nurse Navigator Alla Feeling, NP as Nurse Practitioner (Nurse Practitioner) 05/04/2020  CHIEF COMPLAINT: Follow-up of anorectal cancer  SUMMARY OF ONCOLOGIC HISTORY: Oncology History Overview Note  Cancer Staging Squamous cell carcinoma of rectum [facility]) Staging form: Colon and Rectum, AJCC 8th Edition - Clinical stage from 02/04/2019: Stage IIIA (cT2, cN1a, cM0) - Signed by Truitt Merle, MD on 02/04/2019     Malignant neoplasm of anus ([place])  01/13/2019 Initial Diagnosis   Squamous cell carcinoma of rectum ([place])   01/14/2019 Imaging   CT Pelvis W Contrast 01/14/19 IMPRESSION: Perirectal abscess, at the left posterior aspect of the rectum, inseparable from the wall of the colon and the adjacent pelvic musculature. Largest diameter on cranial caudal imaging measures 4.8 cm.   Asymmetric thickening of the rectum associated with the abscess. A perforated rectal carcinoma is favored given the appearance, and correlation with physical exam as well anoscopy/rigid sigmoidoscopy. Alternatively, the changes may reflect chronic inflammation in the setting of inflammatory bowel disease.   There are several small lymph nodes within the perirectal fat and along the left pelvis, the largest measuring 13 mm, potentially pathologic in the setting of carcinoma, or alternatively reactive.   01/15/2019 Initial Diagnosis   Diagnosis 01/15/19 Soft tissue, abscess, rectal wall - INVASIVE SQUAMOUS CELL CARCINOMA, MODERATELY TO POORLY DIFFERENTIATED.    02/03/2019 Imaging   CT Chest W Contrast 02/03/19  IMPRESSION: 1. Two nodules each measuring about 4 mm in long axis noted in the right upper lobe. One of these is subpleural. These are likely benign but given the context, surveillance imaging in 6-12 months time should be considered. Fleischner criteria for follow do not directly apply given the history of malignancy.   02/04/2019 Cancer Staging   Staging form: Colon and Rectum, AJCC 8th Edition - Clinical stage from 02/04/2019: Stage IIIA (cT2, cN1a, cM0) - Signed by Truitt Merle, MD on 02/04/2019   02/15/2019 PET scan   IMPRESSION: 1. Hypermetabolic eccentric rectal wall thickening with hypermetabolic left perirectal adenopathy. No evidence of distant metastatic disease. 2. Possible gallbladder sludge. 3. Left renal stone.   02/22/2019 - 03/22/2019 Chemotherapy   ChemoRT with Mitomycin/5FU week 1 and 5 starting 02/22/19 and ending 03/22/19   02/22/2019 - 04/02/2019 Radiation Therapy   ChemoRT with Dr. Sondra Come 02/22/19-04/02/19   07/19/2019 PET scan   IMPRESSION: 1. Interval marked improvement in the previously demonstrated hypermetabolic anorectal wall thickening and perirectal adenopathy. Mild residual activity at the anal verge is nonspecific and within physiologic limits. 2. No evidence of metastatic disease. 3. No acute findings.   09/20/2019 Survivorship   SCP delivered via virtual appointment by Cira Rue, NP      CURRENT THERAPY: Surveillance  INTERVAL HISTORY: Ms. Errickson returns for follow-up as scheduled.  She was here for lab on 12/14 but CT scan had not been scheduled.  She is feeling well overall.  Energy has improved overall, she is exercising.  Appetite is good.  Denies change in bowel habits, blood in stool, or anal pain.  She has not seen Dr. Dema Severin lately.  She has difficulty with sexual intercourse, vaginal  dryness.  Denies pain or bleeding.  Denies recent fever, chills, cough, chest pain, dyspnea.  She is up-to-date  on her Covid and flu vaccines.  Due for mammogram this month.   MEDICAL HISTORY:  Past Medical History:  Diagnosis Date  . Herpes simplex disciform keratitis 10/20/2013  . Hypertension   . IBD (inflammatory bowel disease) - colitis 01/11/2018  . Nuclear sclerosis of both eyes 11/30/2014  . Squamous cell carcinoma of rectum ([place]) 01/13/2019  . UC (ulcerative colitis) ([place])     SURGICAL HISTORY: Past Surgical History:  Procedure Laterality Date  . ABDOMINAL HYSTERECTOMY    . BREAST CYST ASPIRATION Left 1983  . BREAST LUMPECTOMY Left   . COLONOSCOPY    . INCISION AND DRAINAGE PERIRECTAL ABSCESS N/A 01/15/2019   Procedure: INTERNAL DRAINAGE OF PERIRECTAL ABSCESS;  Surgeon: Jovita Kussmaul, MD;  Location: [facility];  Service: General;  Laterality: N/A;  . TUBAL LIGATION      I have reviewed the social history and family history with the patient and they are unchanged from previous note.  ALLERGIES:  has No Known Allergies.  MEDICATIONS:  Current Outpatient Medications  Medication Sig Dispense Refill  . aspirin 81 MG tablet Take 81 mg by mouth daily.    . ferrous sulfate 325 (65 FE) MG EC tablet Take 650 mg by mouth daily with breakfast.     . lisinopril-hydrochlorothiazide (PRINZIDE,ZESTORETIC) 20-25 MG tablet Take 1 tablet by mouth daily. 90 tablet 3  . Multiple Vitamins-Calcium (ONE-A-DAY WOMENS PO) Take 1 tablet by mouth daily.    . naproxen sodium (ALEVE) 220 MG tablet Take 220 mg by mouth daily as needed (pain).      No current facility-administered medications for this visit.    PHYSICAL EXAMINATION: ECOG PERFORMANCE STATUS: 0 - Asymptomatic  Vitals:   05/04/20 1325  BP: (!) 128/94  Pulse: 64  Resp: 18  Temp: (!) 97.2 F (36.2 C)  SpO2: 100%   Filed Weights   05/04/20 1325  Weight: 180 lb 1.6 oz (81.7 kg)    GENERAL:alert, no distress and comfortable SKIN: No rash EYES:  sclera clear NECK: Without mass LYMPH:  no palpable cervical, supraclavicular, or inguinal  lymphadenopathy  LUNGS: clear with normal breathing effort HEART: regular rate & rhythm, no lower extremity edema ABDOMEN:abdomen soft, non-tender and normal bowel sounds NEURO: alert & oriented x 3 with fluent speech, no focal motor/sensory deficits Anorectal: Small external anal skin tag.  Internal exam reveals normal tone, posterior scar tissue.  No palpable mass.  No blood on glove  LABORATORY DATA:  I have reviewed the data as listed CBC Latest Ref Rng & Units 05/02/2020 11/24/2019 06/21/2019  WBC 4.0 - 10.5 K/uL 2.6(L) 2.8(L) 3.1(L)  Hemoglobin 12.0 - 15.0 g/dL 11.1(L) 10.9(L) 10.9(L)  Hematocrit 36.0 - 46.0 % 34.0(L) 34.1(L) 34.0(L)  Platelets 150 - 400 K/uL 256 251 260     CMP Latest Ref Rng & Units 05/02/2020 11/24/2019 06/21/2019  Glucose 70 - 99 mg/dL 96 96 92  BUN 8 - 23 mg/dL 11 12 17   Creatinine 0.44 - 1.00 mg/dL 0.77 0.82 0.76  Sodium 135 - 145 mmol/L 140 140 141  Potassium 3.5 - 5.1 mmol/L 4.1 5.0 4.5  Chloride 98 - 111 mmol/L 107 108 105  CO2 22 - 32 mmol/L 27 27 28   Calcium 8.9 - 10.3 mg/dL 9.3 9.2 9.4  Total Protein 6.5 - 8.1 g/dL 7.3 7.2 7.1  Total Bilirubin 0.3 - 1.2 mg/dL 0.4 0.2(L) 0.2(L)  Alkaline Phos 38 - 126 U/L 80 95 74  AST 15 - 41 U/L 14(L) 20 20  ALT 0 - 44 U/L 23 33 33      RADIOGRAPHIC STUDIES: I have personally reviewed the radiological images as listed and agreed with the findings in the report. No results found.   ASSESSMENT & PLAN: Sherria Bodifordis a 63y.o.African Americanfemalewith a history of HTN, IBD, ulcerative colitis  1.Squamous cell carcinoma of rectum, cT2N1aM0 stage IIIA -diagnosed 12/2018. Her CT scans shows low rectal wall thickeningwith abcessand surrounding lymphadenopathy, largest up to 40m. Chest scan also shows 2 tiny 439mright lung nodules which are likely benign. PET scan showedno distant metastasis. -ShecompletedchemoRT with 5FU/mitomycin and radiation from10/5 to 04/02/19 -PET 07/2019 shows near complete  response to treatment -currently on surveillance, followed by Dr. WhDema Severin2. Anemia, GI Bleeding -Per patient she has had chronic mildly low anemia. She has been on oral ferrous sulfate. This worsened secondary to her cancer and GI bleeding -mild anemia gradually improving, iron normal. Can take oral iron 3x/week  3. Rectal Pain, perirectal burning and itching -Secondary to #1, worsened on chemoRT and resolved after treatment   4. Ulcerative Colitis/IBD, Diverticulosis  -Found incidentally on 12/2017 screening colonoscopy  -On Lialda given by Dr GeRedmond Basemane recommends to continue as it primarily acts locally at the mucosal level in the colon, no significant systemic absorption.  5. HTN -On Lisinopril, will continue -she developed hypotension on treatment and stopped lisinopril -she monitors BP at home, I recommended to restart lisinopril if SBP consistently >140 at home, she understands  6. Hypercalcemia -was on daily vitamin and calcium before treatment -PTH related peptideand intact PTH are normal,her hypercalcemiais likely relatedto her cancer. -Ifcalcium>12, will consider zometa -resolved    Disposition: Ms. BoConsalvos clinically doing well.  Exam is benign.  Labs show mild residual anemia and neutropenia likely from chemo RT.  Iron level is adequate, she can reduce oral iron to 1 tab 3 times per week.  There is no clinical concern for cancer recurrence.  She is scheduled for surveillance CT CAP on 05/16/20. I will send a note to Dr. WhDema Severino schedule anoscopy in 3 months.  She will return for follow-up with us[place]orean 6 months.  All questions were answered. The patient knows to call the clinic with any problems, questions or concerns. No barriers to learning were detected.     LaAlla FeelingNP 05/04/20

## 2020-05-04 ENCOUNTER — Other Ambulatory Visit: Payer: Self-pay

## 2020-05-04 ENCOUNTER — Encounter: Payer: Self-pay | Admitting: Nurse Practitioner

## 2020-05-04 ENCOUNTER — Inpatient Hospital Stay: Payer: BC Managed Care – PPO | Admitting: Nurse Practitioner

## 2020-05-04 VITALS — BP 128/94 | HR 64 | Temp 97.2°F | Resp 18 | Ht 65.0 in | Wt 180.1 lb

## 2020-05-04 DIAGNOSIS — C210 Malignant neoplasm of anus, unspecified: Secondary | ICD-10-CM | POA: Diagnosis not present

## 2020-05-15 ENCOUNTER — Other Ambulatory Visit: Payer: Self-pay

## 2020-05-15 ENCOUNTER — Ambulatory Visit
Admission: RE | Admit: 2020-05-15 | Discharge: 2020-05-15 | Disposition: A | Discharge status: Home / Self Care | Payer: BC Managed Care – PPO | Source: Ambulatory Visit | Attending: Physician Assistant | Admitting: Physician Assistant

## 2020-05-15 DIAGNOSIS — Z1231 Encounter for screening mammogram for malignant neoplasm of breast: Secondary | ICD-10-CM

## 2020-05-16 ENCOUNTER — Encounter (HOSPITAL_COMMUNITY): Payer: Self-pay

## 2020-05-16 ENCOUNTER — Ambulatory Visit (HOSPITAL_COMMUNITY)
Admission: RE | Admit: 2020-05-16 | Discharge: 2020-05-16 | Disposition: A | Discharge status: Home / Self Care | Payer: BC Managed Care – PPO | Source: Ambulatory Visit | Attending: Hematology | Admitting: Hematology

## 2020-05-16 DIAGNOSIS — C210 Malignant neoplasm of anus, unspecified: Secondary | ICD-10-CM | POA: Diagnosis present

## 2020-05-16 MED ORDER — IOHEXOL 300 MG/ML  SOLN
100.0000 mL | Freq: Once | INTRAMUSCULAR | Status: AC | PRN
  Administered 2020-05-16: 100 mL via INTRAVENOUS

## 2020-05-24 ENCOUNTER — Other Ambulatory Visit: Payer: BC Managed Care – PPO

## 2020-05-26 ENCOUNTER — Ambulatory Visit: Payer: BC Managed Care – PPO | Admitting: Hematology

## 2020-07-01 IMAGING — MG DIGITAL SCREENING BILATERAL MAMMOGRAM WITH CAD
4 series · 4 of 4 positions shown · non-contrast
Comparison: Previous exam(s).

CLINICAL DATA: Screening.

EXAM:
DIGITAL SCREENING BILATERAL MAMMOGRAM WITH CAD

[R CC]
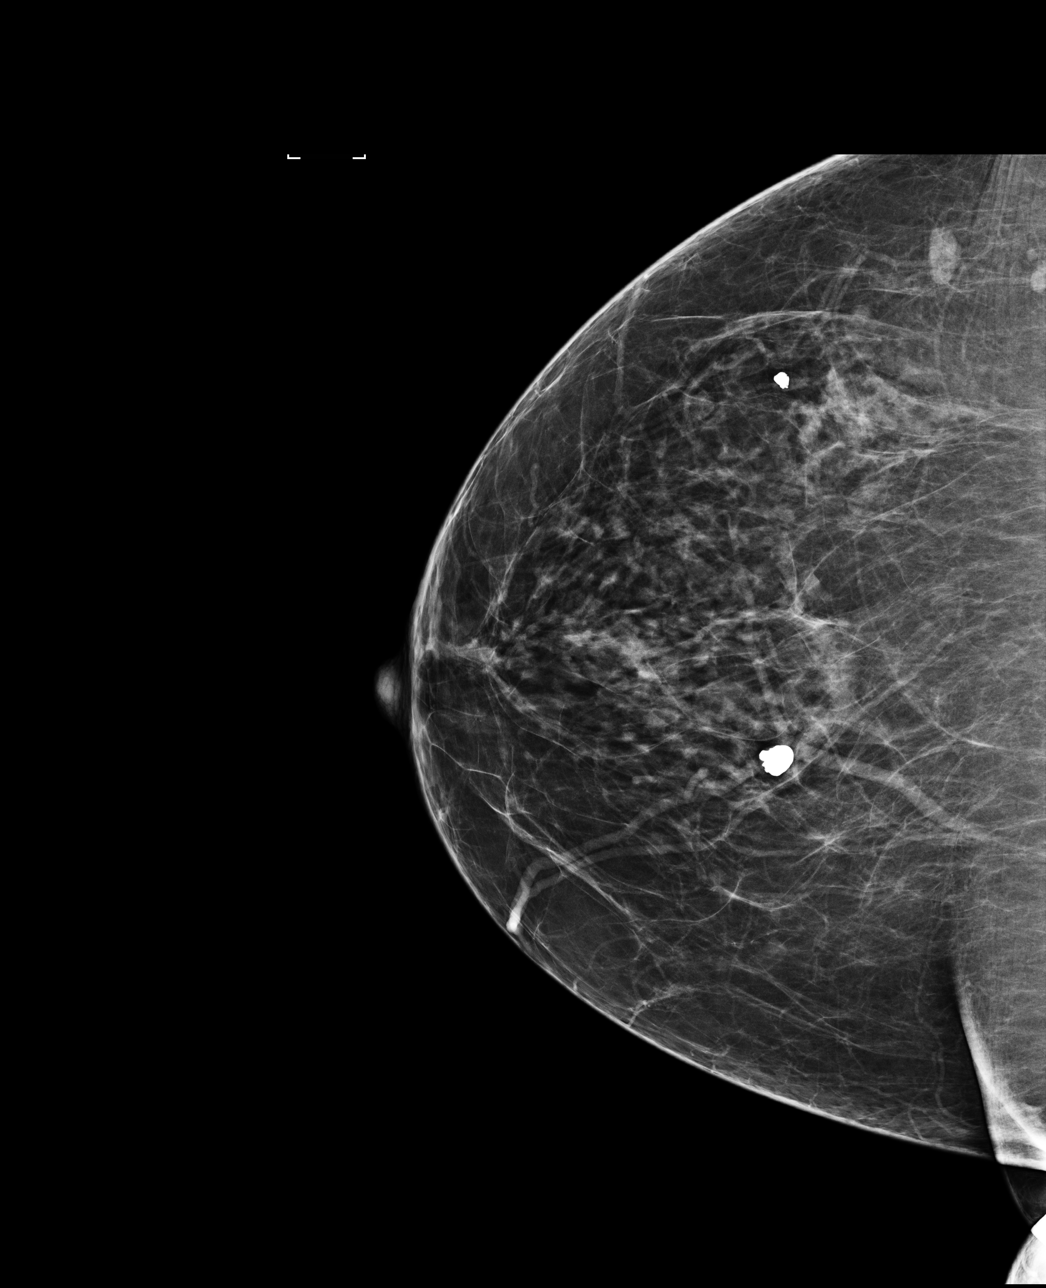

[R MLO]
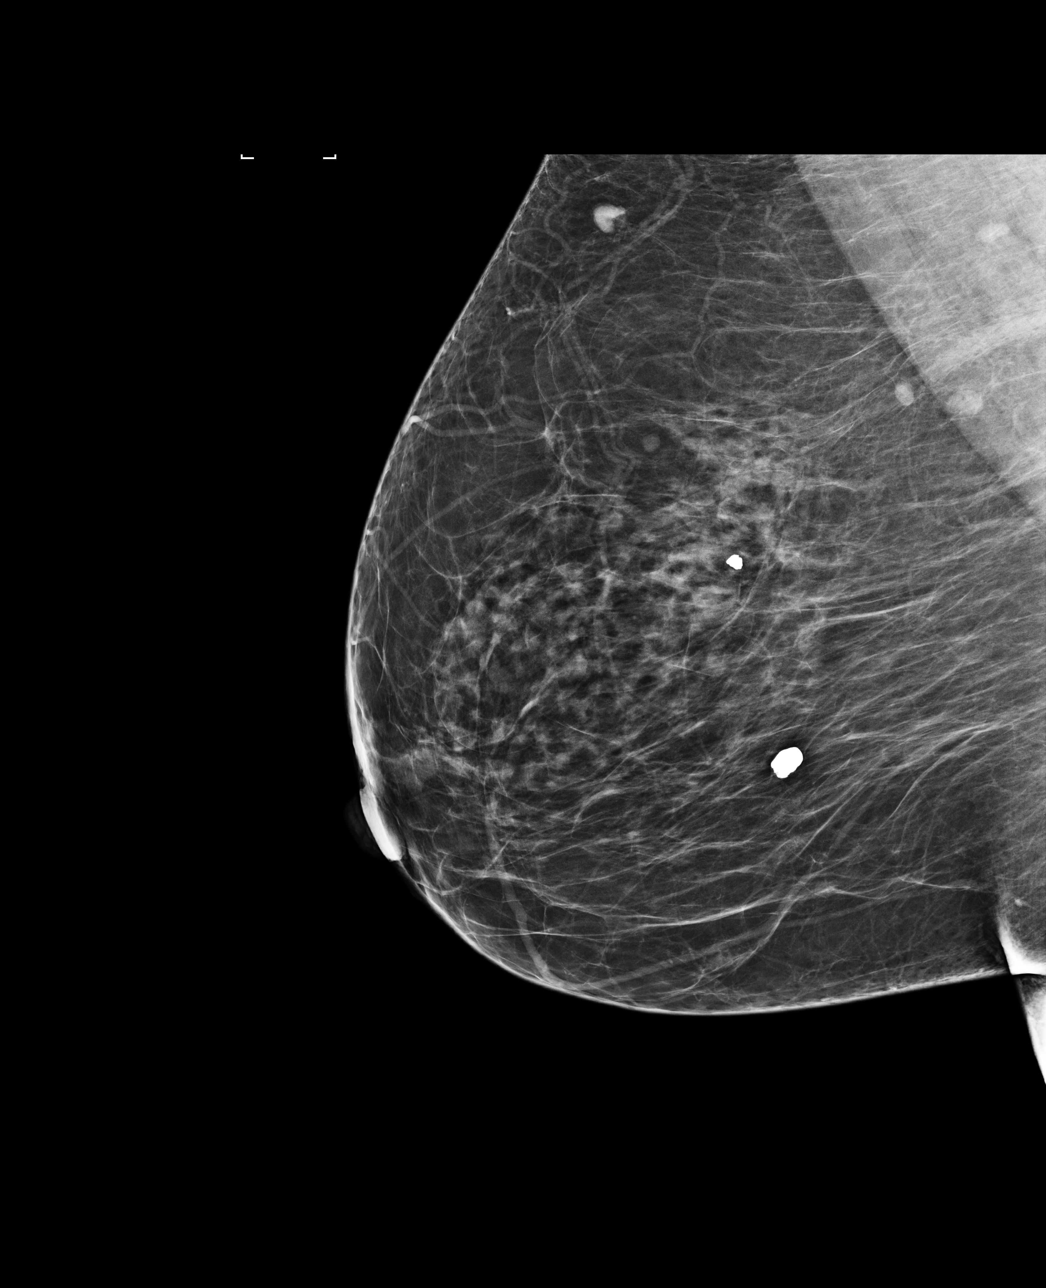

[L CC]
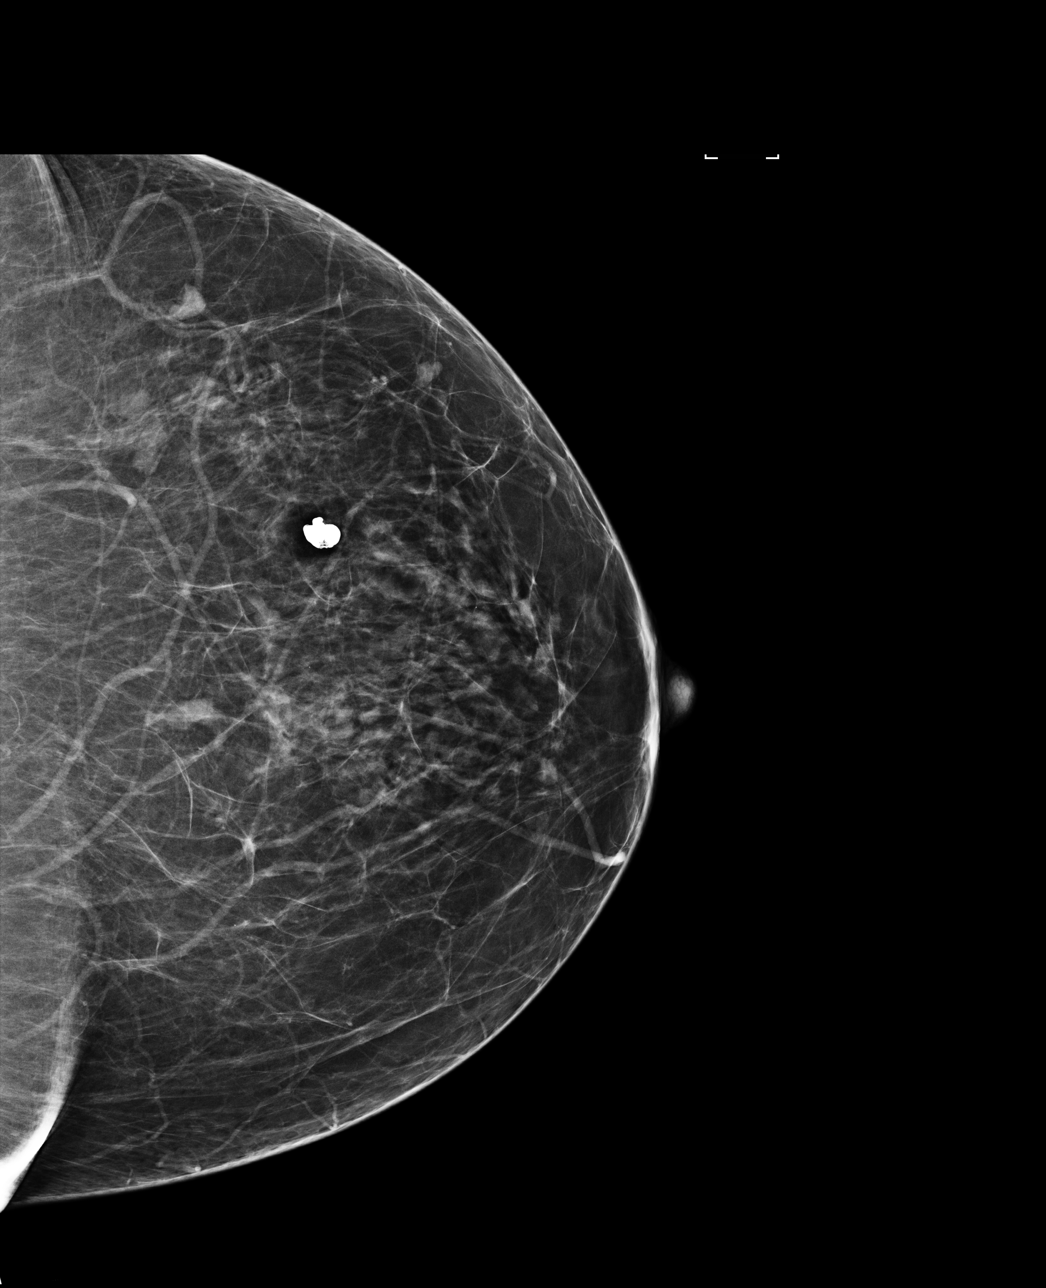

[L MLO]
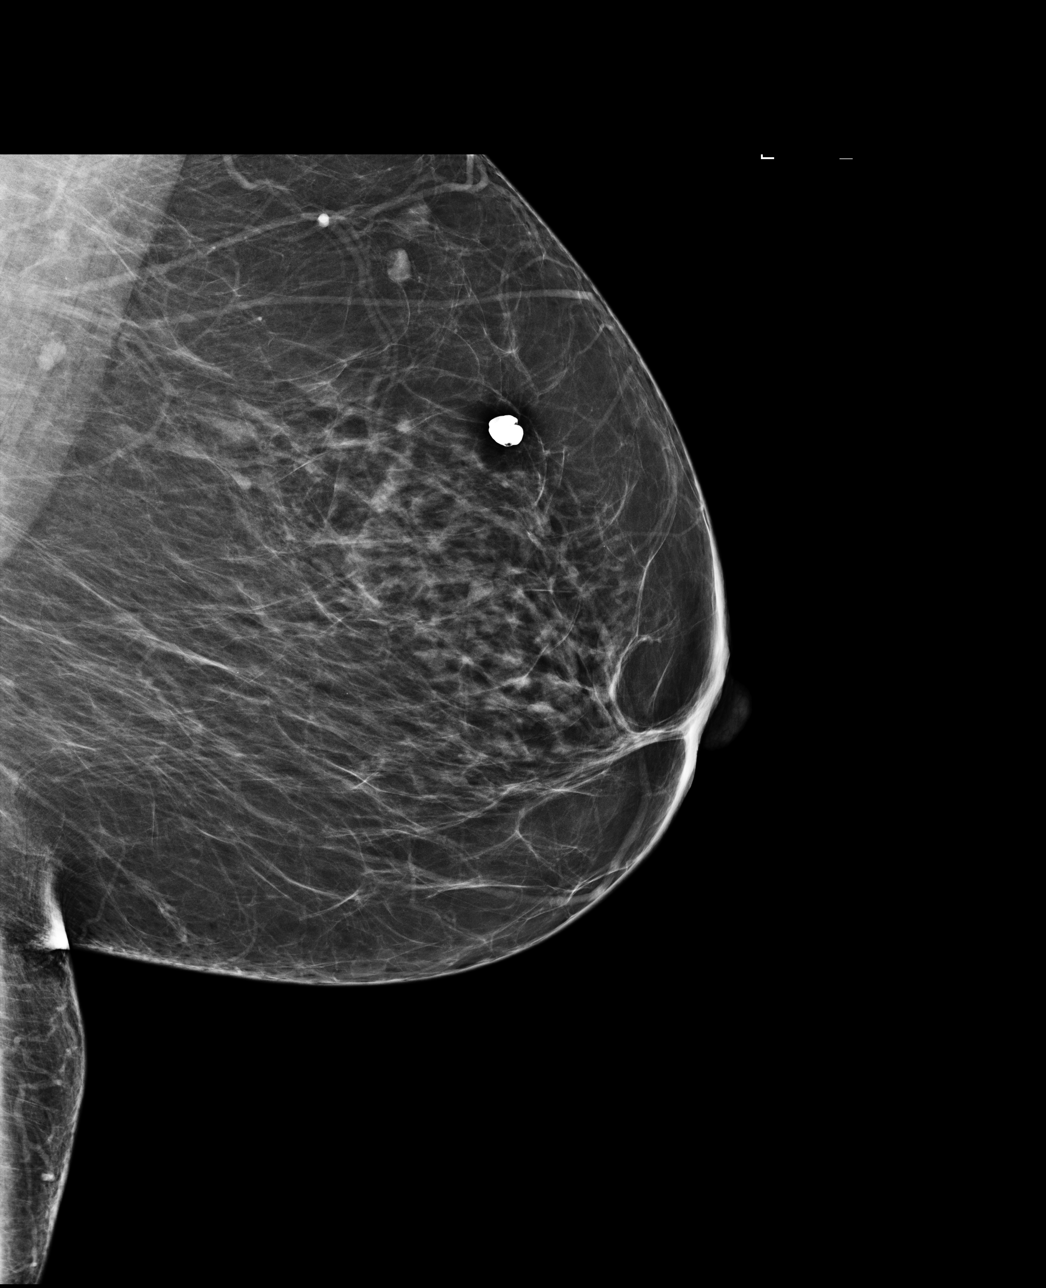

[4 of 4 positions shown; findings below may reference images not displayed]

ACR Breast Density Category b: There are scattered areas of
fibroglandular density.
FINDINGS: There are no findings suspicious for malignancy. Images were
processed with CAD.
IMPRESSION: No mammographic evidence of malignancy. A result letter of this
screening mammogram will be mailed directly to the patient.

RECOMMENDATION:
Screening mammogram in one year. (Code:AS-G-LCT)

BI-RADS CATEGORY  1: Negative.

## 2020-10-26 ENCOUNTER — Encounter: Payer: Self-pay | Admitting: Hematology

## 2020-11-01 ENCOUNTER — Telehealth: Payer: Self-pay | Admitting: Hematology

## 2020-11-01 NOTE — Telephone Encounter (Signed)
Scheduled per 6/15 sch msg. Called pt and left a msg with new appt date and time

## 2020-11-02 ENCOUNTER — Inpatient Hospital Stay: Payer: Self-pay | Admitting: Hematology

## 2020-11-02 ENCOUNTER — Inpatient Hospital Stay: Payer: Self-pay

## 2020-11-08 NOTE — Progress Notes (Signed)
[place]   Telephone:(336) [phone] Fax:(336) [phone]   Clinic Follow up Note   Patient Care Team: Harrison Mons, Imogene as PCP - General (Family Medicine) Jovita Kussmaul, MD as Consulting Physician (General Surgery) Ileana Roup, MD as Consulting Physician (General Surgery) Kyung Rudd, MD as Consulting Physician (Radiation Oncology) Truitt Merle, MD as Consulting Physician (Hematology) Virgina Evener, Dawn, RN (Inactive) as Oncology Nurse Navigator Alla Feeling, NP as Nurse Practitioner (Nurse Practitioner)  Date of Service:  11/14/2020  CHIEF COMPLAINT: F/u rectal cancer  SUMMARY OF ONCOLOGIC HISTORY: Oncology History Overview Note  Cancer Staging Squamous cell carcinoma of rectum [facility]) Staging form: Colon and Rectum, AJCC 8th Edition - Clinical stage from 02/04/2019: Stage IIIA (cT2, cN1a, cM0) - Signed by Truitt Merle, MD on 02/04/2019      Malignant neoplasm of anus ([place])  01/13/2019 Initial Diagnosis   Squamous cell carcinoma of rectum ([place])   01/14/2019 Imaging   CT Pelvis W Contrast 01/14/19 IMPRESSION: Perirectal abscess, at the left posterior aspect of the rectum, inseparable from the wall of the colon and the adjacent pelvic musculature. Largest diameter on cranial caudal imaging measures 4.8 cm.   Asymmetric thickening of the rectum associated with the abscess. A perforated rectal carcinoma is favored given the appearance, and correlation with physical exam as well anoscopy/rigid sigmoidoscopy. Alternatively, the changes may reflect chronic inflammation in the setting of inflammatory bowel disease.   There are several small lymph nodes within the perirectal fat and along the left pelvis, the largest measuring 13 mm, potentially pathologic in the setting of carcinoma, or alternatively reactive.   01/15/2019 Initial Diagnosis   Diagnosis 01/15/19 Soft tissue, abscess, rectal wall - INVASIVE SQUAMOUS CELL CARCINOMA, MODERATELY TO POORLY  DIFFERENTIATED.   02/03/2019 Imaging   CT Chest W Contrast 02/03/19  IMPRESSION: 1. Two nodules each measuring about 4 mm in long axis noted in the right upper lobe. One of these is subpleural. These are likely benign but given the context, surveillance imaging in 6-12 months time should be considered. Fleischner criteria for follow do not directly apply given the history of malignancy.   02/04/2019 Cancer Staging   Staging form: Colon and Rectum, AJCC 8th Edition - Clinical stage from 02/04/2019: Stage IIIA (cT2, cN1a, cM0) - Signed by Truitt Merle, MD on 02/04/2019    02/15/2019 PET scan   IMPRESSION: 1. Hypermetabolic eccentric rectal wall thickening with hypermetabolic left perirectal adenopathy. No evidence of distant metastatic disease. 2. Possible gallbladder sludge. 3. Left renal stone.   02/22/2019 - 03/22/2019 Chemotherapy   ChemoRT with Mitomycin/5FU week 1 and 5 starting 02/22/19 and ending 03/22/19   02/22/2019 - 04/02/2019 Radiation Therapy   ChemoRT with Dr. Sondra Come 02/22/19-04/02/19   07/19/2019 PET scan   IMPRESSION: 1. Interval marked improvement in the previously demonstrated hypermetabolic anorectal wall thickening and perirectal adenopathy. Mild residual activity at the anal verge is nonspecific and within physiologic limits. 2. No evidence of metastatic disease. 3. No acute findings.   09/20/2019 Survivorship   SCP delivered via virtual appointment by Cira Rue, NP    05/16/2020 Imaging   CT CAP  IMPRESSION: 1. No evidence of metastatic disease in the chest, abdomen or pelvis. 2. Mild circumferential wall thickening in the lower rectum is slightly decreased from 07/19/2019 PET-CT, with no discrete residual rectal mass, favoring post treatment change. 3. Trace free fluid in the deep pelvis, nonspecific. 4. Mild left colonic diverticulosis.      CURRENT THERAPY:  Surveillance  INTERVAL HISTORY:  Leah Bailey is here for a follow up of rectal cancer.  She was last seen by me 1 year ago and seen by NP Lacie 6 months ago in interim. She presents to the clinic alone. She is doing well overall Mild fatigue, but able to function normally at home, she is retired  BM normal, no rectal pain or bleeding, occasional stool incontinence  No abdominal pian or bloating  No other new complains   All other systems were reviewed with the patient and are negative.  MEDICAL HISTORY:  Past Medical History:  Diagnosis Date   Herpes simplex disciform keratitis 10/20/2013   Hypertension    IBD (inflammatory bowel disease) - colitis 01/11/2018   Nuclear sclerosis of both eyes 11/30/2014   Squamous cell carcinoma of rectum ([place]) 01/13/2019   UC (ulcerative colitis) ([place])     SURGICAL HISTORY: Past Surgical History:  Procedure Laterality Date   ABDOMINAL HYSTERECTOMY     BREAST CYST ASPIRATION Left 1983   BREAST LUMPECTOMY Left    COLONOSCOPY     INCISION AND DRAINAGE PERIRECTAL ABSCESS N/A 01/15/2019   Procedure: INTERNAL DRAINAGE OF PERIRECTAL ABSCESS;  Surgeon: Jovita Kussmaul, MD;  Location: [facility];  Service: General;  Laterality: N/A;   TUBAL LIGATION      I have reviewed the social history and family history with the patient and they are unchanged from previous note.  ALLERGIES:  has No Known Allergies.  MEDICATIONS:  Current Outpatient Medications  Medication Sig Dispense Refill   aspirin 81 MG tablet Take 81 mg by mouth daily.     ferrous sulfate 325 (65 FE) MG EC tablet Take 650 mg by mouth daily with breakfast.      lisinopril-hydrochlorothiazide (PRINZIDE,ZESTORETIC) 20-25 MG tablet Take 1 tablet by mouth daily. 90 tablet 3   Multiple Vitamins-Calcium (ONE-A-DAY WOMENS PO) Take 1 tablet by mouth daily.     naproxen sodium (ALEVE) 220 MG tablet Take 220 mg by mouth daily as needed (pain).      No current facility-administered medications for this visit.    PHYSICAL EXAMINATION: ECOG PERFORMANCE STATUS: 0 - Asymptomatic  Vitals:    11/14/20 0954  BP: 136/80  Pulse: 67  Resp: 17  Temp: 97.9 F (36.6 C)  SpO2: 100%   Filed Weights   11/14/20 0954  Weight: 183 lb 6.4 oz (83.2 kg)    GENERAL:alert, no distress and comfortable SKIN: skin color, texture, turgor are normal, no rashes or significant lesions EYES: normal, Conjunctiva are pink and non-injected, sclera clear NECK: supple, thyroid normal size, non-tender, without nodularity LYMPH:  no palpable lymphadenopathy in the cervical, axillary  LUNGS: clear to auscultation and percussion with normal breathing effort HEART: regular rate & rhythm and no murmurs and no lower extremity edema ABDOMEN:abdomen soft, non-tender and normal bowel sounds Musculoskeletal:no cyanosis of digits and no clubbing, rectal exam was normal  NEURO: alert & oriented x 3 with fluent speech, no focal motor/sensory deficits  LABORATORY DATA:  I have reviewed the data as listed CBC Latest Ref Rng & Units 11/14/2020 05/02/2020 11/24/2019  WBC 4.0 - 10.5 K/uL 2.8(L) 2.6(L) 2.8(L)  Hemoglobin 12.0 - 15.0 g/dL 11.3(L) 11.1(L) 10.9(L)  Hematocrit 36.0 - 46.0 % 33.7(L) 34.0(L) 34.1(L)  Platelets 150 - 400 K/uL 292 256 251     CMP Latest Ref Rng & Units 11/14/2020 05/02/2020 11/24/2019  Glucose 70 - 99 mg/dL 110(H) 96 96  BUN 8 - 23 mg/dL 12 11 12   Creatinine 0.44 -  1.00 mg/dL 0.76 0.77 0.82  Sodium 135 - 145 mmol/L 139 140 140  Potassium 3.5 - 5.1 mmol/L 4.2 4.1 5.0  Chloride 98 - 111 mmol/L 105 107 108  CO2 22 - 32 mmol/L 24 27 27   Calcium 8.9 - 10.3 mg/dL 9.4 9.3 9.2  Total Protein 6.5 - 8.1 g/dL 7.5 7.3 7.2  Total Bilirubin 0.3 - 1.2 mg/dL 0.3 0.4 0.2(L)  Alkaline Phos 38 - 126 U/L 80 80 95  AST 15 - 41 U/L 20 14(L) 20  ALT 0 - 44 U/L 31 23 33      RADIOGRAPHIC STUDIES: I have personally reviewed the radiological images as listed and agreed with the findings in the report. No results found.   ASSESSMENT & PLAN:  Leah Bailey is a 65 y.o. female with    1. Squamous  cell carcinoma of rectum, cT2N1aM0 stage IIIA   -She was diagnosed in 12/2018. Her CT scans shows low rectal wall thickening with abscess and surrounding lymphadenopathy, largest up to 17m. Chest scan also showed 2 tiny 42mright lung nodules which are likely benign. PET shows no distant metastasis.  -She has completed concurrent chemoRT with Mitomycin and 5FU 10/5-11/13/20. PET from 07/2019 shows near complete response to treatment -She is currently on Surveillance. last surveillant CT scan from December 2021 showed no evidence of residual or recurrent disease -She is clinically doing very well, asymptomatic, exam including rectal exam was unremarkable.  Lab reviewed, no concerns.  There is no clinical concern for residual or recurrent cancer. -Continue follow-up.  I encouraged her to see Dr. WhDema Severinn 3 months, I will see her back in 6 months with surveillance CT scan  2. Mild anemia and leukopenia  -Per patient she has had chronic mildly low anemia and neutropenia  -overall stable, will continue monitoring    3. Ulcerative Colitis/IBD, Diverticulosis   -Found incidentally on 12/2017 screening colonoscopy -On Lialda given by Dr GeCarlean PurlContinue Prilosec   4. HTN -BP has been normal lately.      PLAN:  -F/u in 6 months with lab and CT CAP w contrast a few days before  -she will see Dr. WhDema Severinn 3 month     No problem-specific Assessment & Plan notes found for this encounter.   Orders Placed This Encounter  Procedures   CT CHEST ABDOMEN PELVIS W CONTRAST    Standing Status:   Future    Standing Expiration Date:   11/14/2021    Order Specific Question:   If indicated for the ordered procedure, I authorize the administration of contrast media per Radiology protocol    Answer:   Yes    Order Specific Question:   [facility]    [facility]  Order Specific Question:   Release to patient    Answer:   Immediate    Order Specific Question:   Is Oral  Contrast requested for this exam?    Answer:   Yes, Per Radiology protocol    Order Specific Question:   Reason for Exam (SYMPTOM  OR DIAGNOSIS REQUIRED)    Answer:   rule out cancer recurrence    All questions were answered. The patient knows to call the clinic with any problems, questions or concerns. No barriers to learning was detected. The total time spent in the appointment was 25 minutes.     YaTruitt MerleMD 11/14/2020   I, AmJoslyn Devonam acting as scribe for YaTruitt Merle  MD.   I have reviewed the above documentation for accuracy and completeness, and I agree with the above.

## 2020-11-14 ENCOUNTER — Encounter: Payer: Self-pay | Admitting: Hematology

## 2020-11-14 ENCOUNTER — Inpatient Hospital Stay: Payer: 59 | Attending: Hematology

## 2020-11-14 ENCOUNTER — Other Ambulatory Visit: Payer: Self-pay

## 2020-11-14 ENCOUNTER — Inpatient Hospital Stay (HOSPITAL_BASED_OUTPATIENT_CLINIC_OR_DEPARTMENT_OTHER): Payer: 59 | Admitting: Hematology

## 2020-11-14 VITALS — BP 136/80 | HR 67 | Temp 97.9°F | Resp 17 | Wt 183.4 lb

## 2020-11-14 DIAGNOSIS — C210 Malignant neoplasm of anus, unspecified: Secondary | ICD-10-CM | POA: Diagnosis not present

## 2020-11-14 DIAGNOSIS — Z85048 Personal history of other malignant neoplasm of rectum, rectosigmoid junction, and anus: Secondary | ICD-10-CM | POA: Insufficient documentation

## 2020-11-14 DIAGNOSIS — I10 Essential (primary) hypertension: Secondary | ICD-10-CM | POA: Insufficient documentation

## 2020-11-14 DIAGNOSIS — D649 Anemia, unspecified: Secondary | ICD-10-CM | POA: Diagnosis not present

## 2020-11-14 DIAGNOSIS — D72819 Decreased white blood cell count, unspecified: Secondary | ICD-10-CM | POA: Insufficient documentation

## 2020-11-14 DIAGNOSIS — C20 Malignant neoplasm of rectum: Secondary | ICD-10-CM

## 2020-11-14 DIAGNOSIS — K5190 Ulcerative colitis, unspecified, without complications: Secondary | ICD-10-CM | POA: Diagnosis not present

## 2020-11-14 LAB — CBC WITH DIFFERENTIAL (CANCER CENTER ONLY)
Abs Immature Granulocytes: 0.01 10*3/uL (ref 0.00–0.07)
Basophils Absolute: 0 10*3/uL (ref 0.0–0.1)
Basophils Relative: 1 %
Eosinophils Absolute: 0 10*3/uL (ref 0.0–0.5)
Eosinophils Relative: 1 %
HCT: 33.7 % — ABNORMAL LOW (ref 36.0–46.0)
Hemoglobin: 11.3 g/dL — ABNORMAL LOW (ref 12.0–15.0)
Immature Granulocytes: 0 %
Lymphocytes Relative: 32 %
Lymphs Abs: 0.9 10*3/uL (ref 0.7–4.0)
MCH: 29.4 pg (ref 26.0–34.0)
MCHC: 33.5 g/dL (ref 30.0–36.0)
MCV: 87.8 fL (ref 80.0–100.0)
Monocytes Absolute: 0.3 10*3/uL (ref 0.1–1.0)
Monocytes Relative: 9 %
Neutro Abs: 1.6 10*3/uL — ABNORMAL LOW (ref 1.7–7.7)
Neutrophils Relative %: 57 %
Platelet Count: 292 10*3/uL (ref 150–400)
RBC: 3.84 MIL/uL — ABNORMAL LOW (ref 3.87–5.11)
RDW: 13.3 % (ref 11.5–15.5)
WBC Count: 2.8 10*3/uL — ABNORMAL LOW (ref 4.0–10.5)
nRBC: 0 % (ref 0.0–0.2)

## 2020-11-14 LAB — IRON AND TIBC
Iron: 59 ug/dL (ref 41–142)
Saturation Ratios: 20 % — ABNORMAL LOW (ref 21–57)
TIBC: 293 ug/dL (ref 236–444)
UIBC: 234 ug/dL (ref 120–384)

## 2020-11-14 LAB — CMP (CANCER CENTER ONLY)
ALT: 31 U/L (ref 0–44)
AST: 20 U/L (ref 15–41)
Albumin: 4 g/dL (ref 3.5–5.0)
Alkaline Phosphatase: 80 U/L (ref 38–126)
Anion gap: 10 (ref 5–15)
BUN: 12 mg/dL (ref 8–23)
CO2: 24 mmol/L (ref 22–32)
Calcium: 9.4 mg/dL (ref 8.9–10.3)
Chloride: 105 mmol/L (ref 98–111)
Creatinine: 0.76 mg/dL (ref 0.44–1.00)
GFR, Estimated: >60 mL/min (ref >60)
Glucose, Bld: 110 mg/dL — ABNORMAL HIGH (ref 70–99)
Potassium: 4.2 mmol/L (ref 3.5–5.1)
Sodium: 139 mmol/L (ref 135–145)
Total Bilirubin: 0.3 mg/dL (ref 0.3–1.2)
Total Protein: 7.5 g/dL (ref 6.5–8.1)

## 2020-11-14 LAB — FERRITIN: Ferritin: 434 ng/mL — ABNORMAL HIGH (ref 11–307)

## 2020-11-15 ENCOUNTER — Telehealth: Payer: Self-pay | Admitting: Hematology

## 2020-11-15 NOTE — Telephone Encounter (Signed)
Scheduled follow-up appointments per 6/28 los. Patient is aware.

## 2021-05-08 ENCOUNTER — Ambulatory Visit (HOSPITAL_COMMUNITY)
Admission: RE | Admit: 2021-05-08 | Discharge: 2021-05-08 | Disposition: A | Discharge status: Home / Self Care | Payer: 59 | Source: Ambulatory Visit | Attending: Hematology | Admitting: Hematology

## 2021-05-08 ENCOUNTER — Encounter (HOSPITAL_COMMUNITY): Payer: Self-pay

## 2021-05-08 ENCOUNTER — Other Ambulatory Visit: Payer: Self-pay

## 2021-05-08 ENCOUNTER — Inpatient Hospital Stay: Payer: 59 | Attending: Hematology

## 2021-05-08 DIAGNOSIS — I10 Essential (primary) hypertension: Secondary | ICD-10-CM | POA: Insufficient documentation

## 2021-05-08 DIAGNOSIS — C210 Malignant neoplasm of anus, unspecified: Secondary | ICD-10-CM

## 2021-05-08 DIAGNOSIS — D709 Neutropenia, unspecified: Secondary | ICD-10-CM | POA: Insufficient documentation

## 2021-05-08 DIAGNOSIS — Z85048 Personal history of other malignant neoplasm of rectum, rectosigmoid junction, and anus: Secondary | ICD-10-CM | POA: Insufficient documentation

## 2021-05-08 DIAGNOSIS — C20 Malignant neoplasm of rectum: Secondary | ICD-10-CM

## 2021-05-08 DIAGNOSIS — K5190 Ulcerative colitis, unspecified, without complications: Secondary | ICD-10-CM | POA: Insufficient documentation

## 2021-05-08 DIAGNOSIS — D649 Anemia, unspecified: Secondary | ICD-10-CM | POA: Insufficient documentation

## 2021-05-08 LAB — CMP (CANCER CENTER ONLY)
ALT: 23 U/L (ref 0–44)
AST: 16 U/L (ref 15–41)
Albumin: 4.3 g/dL (ref 3.5–5.0)
Alkaline Phosphatase: 75 U/L (ref 38–126)
Anion gap: 9 (ref 5–15)
BUN: 14 mg/dL (ref 8–23)
CO2: 26 mmol/L (ref 22–32)
Calcium: 9.1 mg/dL (ref 8.9–10.3)
Chloride: 104 mmol/L (ref 98–111)
Creatinine: 0.74 mg/dL (ref 0.44–1.00)
GFR, Estimated: >60 mL/min (ref >60)
Glucose, Bld: 98 mg/dL (ref 70–99)
Potassium: 3.8 mmol/L (ref 3.5–5.1)
Sodium: 139 mmol/L (ref 135–145)
Total Bilirubin: 0.3 mg/dL (ref 0.3–1.2)
Total Protein: 7.5 g/dL (ref 6.5–8.1)

## 2021-05-08 LAB — CBC WITH DIFFERENTIAL (CANCER CENTER ONLY)
Abs Immature Granulocytes: 0 10*3/uL (ref 0.00–0.07)
Basophils Absolute: 0 10*3/uL (ref 0.0–0.1)
Basophils Relative: 1 %
Eosinophils Absolute: 0 10*3/uL (ref 0.0–0.5)
Eosinophils Relative: 1 %
HCT: 35.3 % — ABNORMAL LOW (ref 36.0–46.0)
Hemoglobin: 11.6 g/dL — ABNORMAL LOW (ref 12.0–15.0)
Immature Granulocytes: 0 %
Lymphocytes Relative: 33 %
Lymphs Abs: 1 10*3/uL (ref 0.7–4.0)
MCH: 29.3 pg (ref 26.0–34.0)
MCHC: 32.9 g/dL (ref 30.0–36.0)
MCV: 89.1 fL (ref 80.0–100.0)
Monocytes Absolute: 0.3 10*3/uL (ref 0.1–1.0)
Monocytes Relative: 10 %
Neutro Abs: 1.7 10*3/uL (ref 1.7–7.7)
Neutrophils Relative %: 55 %
Platelet Count: 292 10*3/uL (ref 150–400)
RBC: 3.96 MIL/uL (ref 3.87–5.11)
RDW: 12.9 % (ref 11.5–15.5)
WBC Count: 3.1 10*3/uL — ABNORMAL LOW (ref 4.0–10.5)
nRBC: 0 % (ref 0.0–0.2)

## 2021-05-08 LAB — IRON AND IRON BINDING CAPACITY (CC-WL,HP ONLY)
Iron: 80 ug/dL (ref 28–170)
Saturation Ratios: 25 % (ref 10.4–31.8)
TIBC: 315 ug/dL (ref 250–450)
UIBC: 235 ug/dL (ref 148–442)

## 2021-05-08 LAB — FERRITIN: Ferritin: 412 ng/mL — ABNORMAL HIGH (ref 11–307)

## 2021-05-08 MED ORDER — SODIUM CHLORIDE (PF) 0.9 % IJ SOLN
INTRAMUSCULAR | Status: AC
  Filled 2021-05-08: qty 50

## 2021-05-08 MED ORDER — IOHEXOL 350 MG/ML SOLN
80.0000 mL | Freq: Once | INTRAVENOUS | Status: AC | PRN
  Administered 2021-05-08: 09:00:00 80 mL via INTRAVENOUS

## 2021-05-10 ENCOUNTER — Inpatient Hospital Stay (HOSPITAL_BASED_OUTPATIENT_CLINIC_OR_DEPARTMENT_OTHER): Payer: 59 | Admitting: Hematology

## 2021-05-10 ENCOUNTER — Encounter: Payer: Self-pay | Admitting: Hematology

## 2021-05-10 ENCOUNTER — Other Ambulatory Visit: Payer: Self-pay

## 2021-05-10 VITALS — BP 152/89 | HR 70 | Temp 98.4°F | Resp 18 | Ht 65.0 in | Wt 183.8 lb

## 2021-05-10 DIAGNOSIS — C210 Malignant neoplasm of anus, unspecified: Secondary | ICD-10-CM

## 2021-05-10 NOTE — Progress Notes (Signed)
[place]   [phone] Fax:([phone]) [phone]   Clinic Follow up Note   Patient Care Team: Harrison Mons, PA as PCP - General (Family Medicine) Jovita Kussmaul, MD as Consulting Physician (General Surgery) Ileana Roup, MD as Consulting Physician (General Surgery) Kyung Rudd, MD as Consulting Physician (Radiation Oncology) Truitt Merle, MD as Consulting Physician (Hematology) Arna Snipe, RN as Oncology Nurse Navigator Alla Feeling, NP as Nurse Practitioner (Nurse Practitioner)  Date of Service:  05/10/2021  CHIEF COMPLAINT: f/u of rectal cancer  CURRENT THERAPY:  Surveillance  ASSESSMENT & PLAN:  Leah Bailey is a 65 y.o. female with   1. Squamous cell carcinoma of rectum, cT2N1aM0 stage IIIA   -She was diagnosed in 12/2018. Her CT scans shows low rectal wall thickening with abscess and surrounding lymphadenopathy, largest up to 29m. Chest scan also showed 2 tiny 462mright lung nodules which are likely benign. PET shows no distant metastasis.  -She has completed concurrent chemoRT with Mitomycin and 5FU 10/5-11/13/20. PET from 07/2019 shows near complete response to treatment. She is currently on Surveillance.  -CT CAP on 05/08/21 showed NED, I reviewed with pt  -She is clinically doing very well, asymptomatic, exam including rectal exam was unremarkable.  Lab reviewed, no concerns.  There is no clinical concern for residual or recurrent cancer. -Continue follow-up. She has not seen Dr. WhDema Severinack recently (last report scanned into Epic is from 05/2019). I will see her back in 6 months and will order one more scan for her at that time.   2. Mild anemia and leukopenia  -Per patient she has had chronic mildly low anemia and neutropenia  -hgb remains slightly low at 11.6, but ferritin is elevated at 412 and iron is WNL at 80. (05/08/21)   3. Ulcerative Colitis/IBD, Diverticulosis   -Found incidentally on 12/2017 screening colonoscopy -On  Lialda given by Dr GeCarlean PurlContinue Prilosec   4. HTN -BP has been normal lately.      PLAN:  -lab and scan reviewed, NED -lab and f/u in 6 months   No problem-specific Assessment & Plan notes found for this encounter.   SUMMARY OF ONCOLOGIC HISTORY: Oncology History Overview Note  Cancer Staging Squamous [facility] carcinoma of [facility]um ([facility]: Colon and Rectum, AJCC 8th Edition - Clinical stage from 02/04/2019: Stage IIIA (cT2, cN1a, cM0) - Signed by FeTruitt MerleMD on 02/04/2019     Malignant neoplasm of anus ([facility] 01/13/2019 Initial Diagnosis   Squ[facility] [place] ([facility] [place]  01/14/2019 Imaging   CT Pelvis W Contrast 01/14/19 IMPRESSION: Perirectal abscess, at the left posterior aspect of the rectum, inseparable from the wall of the colon and the adjacent pelvic musculature. Largest diameter on cranial caudal imaging measures 4.8 cm.   Asymmetric thickening of the rectum associated with the abscess. A perforated rectal carcinoma is favored given the appearance, and correlation with physical exam as well anoscopy/rigid sigmoidoscopy. Alternatively, the changes may reflect chronic inflammation in the setting of inflammatory bowel disease.   There are several small lymph nodes within the perirectal fat and along the left pelvis, the largest measuring 13 mm, potentially pathologic in the setting of carcinoma, or alternatively reactive.   01/15/2019 Initial Diagnosis   Diagnosis 01/15/19 Soft tissue, abscess, rectal wall - INVASIVE SQUAMOUS CELL CARCINOMA, MODERATELY TO POORLY DIFFERENTIATED.   02/03/2019 Imaging   CT Chest W Contrast 02/03/19  IMPRESSION: 1. Two nodules each measuring about 4 mm in long axis noted  in the right upper lobe. One of these is subpleural. These are likely benign but given the context, surveillance imaging in 6-12 months time should be considered. Fleischner criteria for follow do not directly apply given the history of  malignancy.   02/04/2019 Cancer Staging   Staging form: Colon and Rectum, AJCC 8th Edition - Clinical stage from 02/04/2019: Stage IIIA (cT2, cN1a, cM0) - Signed by Truitt Merle, MD on 02/04/2019    02/15/2019 PET scan   IMPRESSION: 1. Hypermetabolic eccentric rectal wall thickening with hypermetabolic left perirectal adenopathy. No evidence of distant metastatic disease. 2. Possible gallbladder sludge. 3. Left renal stone.   02/22/2019 - 03/22/2019 Chemotherapy   ChemoRT with Mitomycin/5FU week 1 and 5 starting 02/22/19 and ending 03/22/19   02/22/2019 - 04/02/2019 Radiation Therapy   ChemoRT with Dr. Sondra Come 02/22/19-04/02/19   07/19/2019 PET scan   IMPRESSION: 1. Interval marked improvement in the previously demonstrated hypermetabolic anorectal wall thickening and perirectal adenopathy. Mild residual activity at the anal verge is nonspecific and within physiologic limits. 2. No evidence of metastatic disease. 3. No acute findings.   09/20/2019 Survivorship   SCP delivered via virtual appointment by Cira Rue, NP    05/16/2020 Imaging   CT CAP  IMPRESSION: 1. No evidence of metastatic disease in the chest, abdomen or pelvis. 2. Mild circumferential wall thickening in the lower rectum is slightly decreased from 07/19/2019 PET-CT, with no discrete residual rectal mass, favoring post treatment change. 3. Trace free fluid in the deep pelvis, nonspecific. 4. Mild left colonic diverticulosis.   05/08/2021 Imaging   EXAM: CT CHEST, ABDOMEN, AND PELVIS WITH CONTRAST  IMPRESSION: No evidence of recurrent or metastatic carcinoma. No other acute findings.   Colonic diverticulosis. No radiographic evidence of diverticulitis.   Aortic Atherosclerosis (ICD10-I70.0).      INTERVAL HISTORY:  Leah Bailey is here for a follow up of rectal cancer. She was last seen by me on 11/14/20. She presents to the clinic alone. She notes she rode here with her cousin, who also has an  appointment here today. She reports she has been doing well overall. She denies pain or any concerns. She notes her only residual side effect is some fatigue. She notes her BP is elevated today because she was stressed about her car not starting this morning.    All other systems were reviewed with the patient and are negative.  MEDICAL HISTORY:  Past Medical History:  Diagnosis Date   Herpes simplex disciform keratitis 10/20/2013   Hypertension    IBD (inflammatory bowel disease) - colitis 01/11/2018   Nuclear sclerosis of both eyes 11/30/2014   Squamous cell carcinoma of rectum ([place]) 01/13/2019   UC (ulcerative colitis) ([employer])     SURGICAL HISTORY: Past Surgical History:  Procedure Laterality Date   ABDOMINAL HYSTERECTOMY     BREAST CYST ASPIRATION Left 1983   BREAST LUMPECTOMY Left    COLONOSCOPY     INCISION AND DRAINAGE PERIRECTAL ABSCESS N/A 01/15/2019   Procedure: INTERNAL DRAINAGE OF PERIRECTAL ABSCESS;  Surgeon: Jovita Kussmaul, MD;  Location: [facility];  Service: General;  Laterality: N/A;   TUBAL LIGATION      I have reviewed the social history and family history with the patient and they are unchanged from previous note.  ALLERGIES:  has No Known Allergies.  MEDICATIONS:  Current Outpatient Medications  Medication Sig Dispense Refill   aspirin 81 MG tablet Take 81 mg by mouth daily.     ferrous sulfate 325 (65  FE) MG EC tablet Take 650 mg by mouth daily with breakfast.      lisinopril-hydrochlorothiazide (PRINZIDE,ZESTORETIC) 20-25 MG tablet Take 1 tablet by mouth daily. 90 tablet 3   Multiple Vitamins-Calcium (ONE-A-DAY WOMENS PO) Take 1 tablet by mouth daily.     No current facility-administered medications for this visit.    PHYSICAL EXAMINATION: ECOG PERFORMANCE STATUS: 0 - Asymptomatic  Vitals:   05/10/21 1008  BP: (!) 152/89  Pulse: 70  Resp: 18  Temp: 98.4 F (36.9 C)  SpO2: 99%   Wt Readings from Last 3 Encounters:  05/10/21 183 lb 12.8 oz (83.4 kg)   11/14/20 183 lb 6.4 oz (83.2 kg)  05/04/20 180 lb 1.6 oz (81.7 kg)     GENERAL:alert, no distress and comfortable SKIN: skin color, texture, turgor are normal, no rashes or significant lesions EYES: normal, Conjunctiva are pink and non-injected, sclera clear  NECK: supple, thyroid normal size, non-tender, without nodularity LYMPH:  no palpable lymphadenopathy in the cervical, axillary, or inguinal LUNGS: clear to auscultation and percussion with normal breathing effort HEART: regular rate & rhythm and no murmurs and no lower extremity edema ABDOMEN:abdomen soft, non-tender and normal bowel sounds Musculoskeletal:no cyanosis of digits and no clubbing  NEURO: alert & oriented x 3 with fluent speech, no focal motor/sensory deficits RECTAL: No palpable mass. Hemorrhoids present, minimal blood on glove. Normal stool present. Benign exam    LABORATORY DATA:  I have reviewed the data as listed CBC Latest Ref Rng & Units 05/08/2021 11/14/2020 05/02/2020  WBC 4.0 - 10.5 K/uL 3.1(L) 2.8(L) 2.6(L)  Hemoglobin 12.0 - 15.0 g/dL 11.6(L) 11.3(L) 11.1(L)  Hematocrit 36.0 - 46.0 % 35.3(L) 33.7(L) 34.0(L)  Platelets 150 - 400 K/uL 292 292 256     CMP Latest Ref Rng & Units 05/08/2021 11/14/2020 05/02/2020  Glucose 70 - 99 mg/dL 98 110(H) 96  BUN 8 - 23 mg/dL 14 12 11   Creatinine 0.44 - 1.00 mg/dL 0.74 0.76 0.77  Sodium 135 - 145 mmol/L 139 139 140  Potassium 3.5 - 5.1 mmol/L 3.8 4.2 4.1  Chloride 98 - 111 mmol/L 104 105 107  CO2 22 - 32 mmol/L 26 24 27   Calcium 8.9 - 10.3 mg/dL 9.1 9.4 9.3  Total Protein 6.5 - 8.1 g/dL 7.5 7.5 7.3  Total Bilirubin 0.3 - 1.2 mg/dL 0.3 0.3 0.4  Alkaline Phos 38 - 126 U/L 75 80 80  AST 15 - 41 U/L 16 20 14(L)  ALT 0 - 44 U/L 23 31 23       RADIOGRAPHIC STUDIES: I have personally reviewed the radiological images as listed and agreed with the findings in the report. No results found.    No orders of the defined types were placed in this encounter.  All  questions were answered. The patient knows to call the clinic with any problems, questions or concerns. No barriers to learning was detected. The total time spent in the appointment was 30 minutes.     Truitt Merle, MD 05/10/2021   I, Wilburn Mylar, am acting as scribe for Truitt Merle, MD.   I have reviewed the above documentation for accuracy and completeness, and I agree with the above.

## 2021-05-15 ENCOUNTER — Telehealth: Payer: Self-pay

## 2021-05-15 NOTE — Telephone Encounter (Signed)
-----   Message from Truitt Merle, MD sent at 05/13/2021 11:21 PM EST ----- Please let pt stop taking oral iron if she is still on, based on her iron level, thanks   Truitt Merle  05/13/2021

## 2021-05-15 NOTE — Telephone Encounter (Signed)
Called and spoke to Mrs. Hopson advising her to stop her oral Iron.  She is doing well and will not take her oral iron going further.  Lab results shared with her as well. Gardiner Rhyme

## 2021-05-17 ENCOUNTER — Encounter: Payer: Self-pay | Admitting: Hematology

## 2021-05-25 ENCOUNTER — Telehealth: Payer: Self-pay | Admitting: Hematology

## 2021-05-25 NOTE — Telephone Encounter (Signed)
Sch per 12/22 los, pt aware

## 2021-08-04 ENCOUNTER — Other Ambulatory Visit: Payer: Self-pay | Admitting: Hematology

## 2021-08-04 DIAGNOSIS — C210 Malignant neoplasm of anus, unspecified: Secondary | ICD-10-CM

## 2021-08-06 ENCOUNTER — Other Ambulatory Visit: Payer: Self-pay

## 2021-08-06 DIAGNOSIS — C210 Malignant neoplasm of anus, unspecified: Secondary | ICD-10-CM

## 2021-08-24 IMAGING — CT CT PELVIS WITH CONTRAST
2 of 3 series · 16 of 46 positions shown, 18 images · IV contrast (APPLIED)
Comparison: November 17, 2018

CLINICAL DATA: 62-year-old female with a history of possible
abscess. History of Crohn's disease

EXAM:
CT PELVIS WITH CONTRAST
TECHNIQUE: Multidetector CT imaging of the pelvis was performed using the
standard protocol following the bolus administration of intravenous
contrast.
CONTRAST:  100mL OMNIPAQUE IOHEXOL 300 MG/ML  SOLN

[Series 2: axial st · axial · 0.74mm/px · z∈[-735,-515]mm · 13 of 52 slices shown, 15 images]
[im 4/52  soft-tissue]
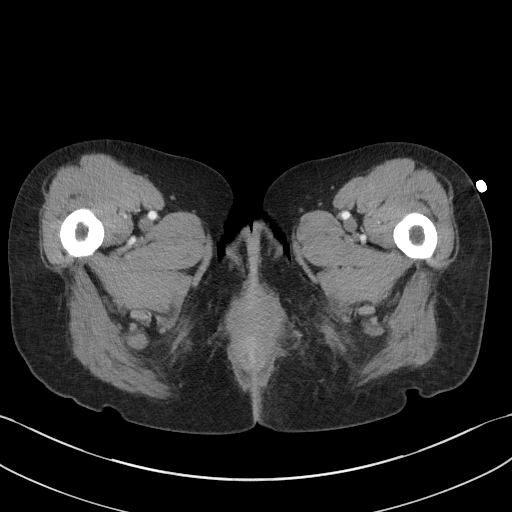
[im 4/52  bone]
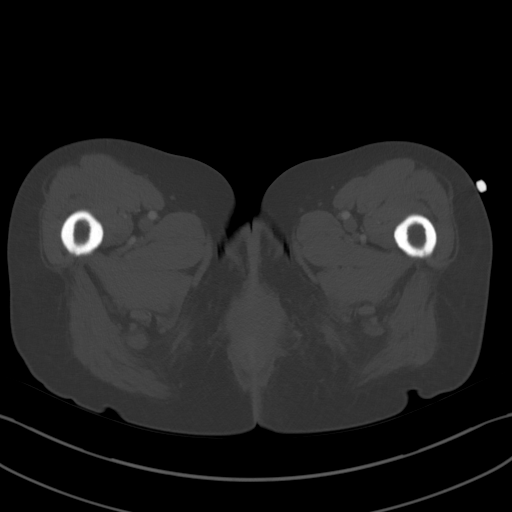
[im 7/52  soft-tissue]
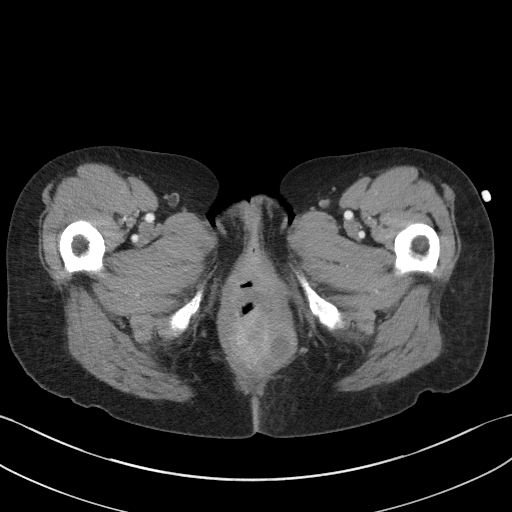
[im 10/52  soft-tissue]
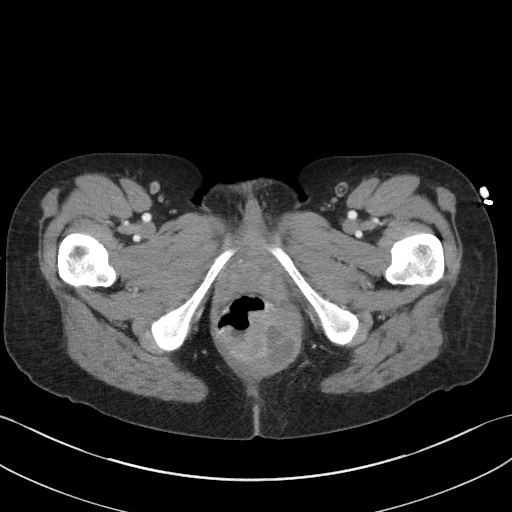
[im 15/52  soft-tissue]
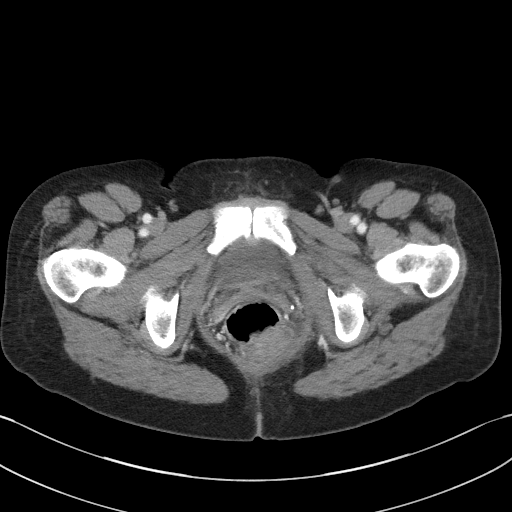
[im 19/52  soft-tissue]
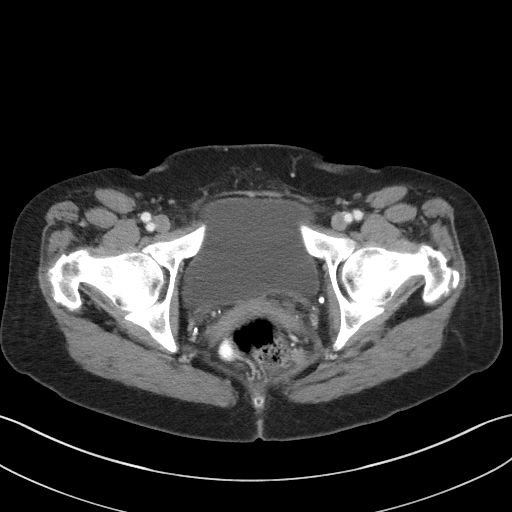
[im 22/52  soft-tissue]
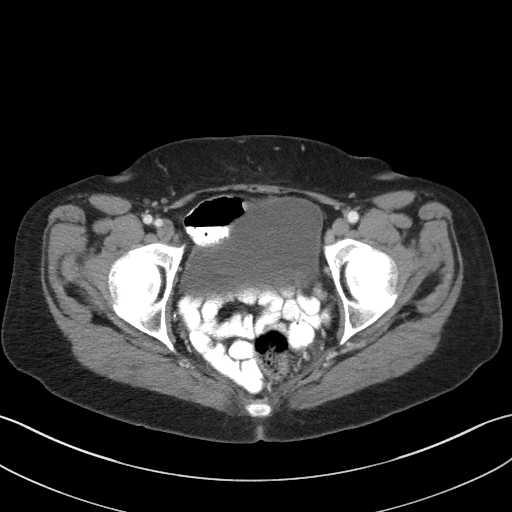
[im 27/52  soft-tissue]
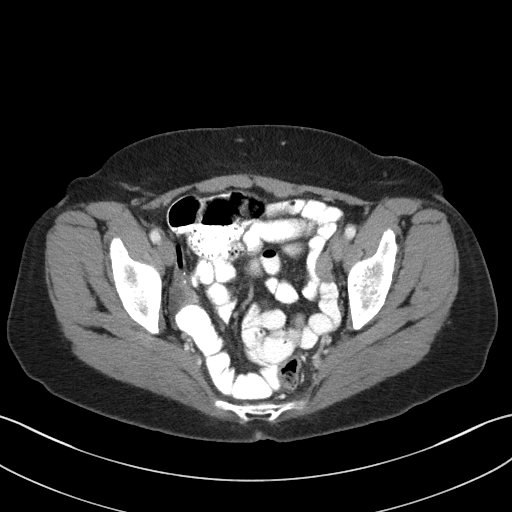
[im 30/52  soft-tissue]
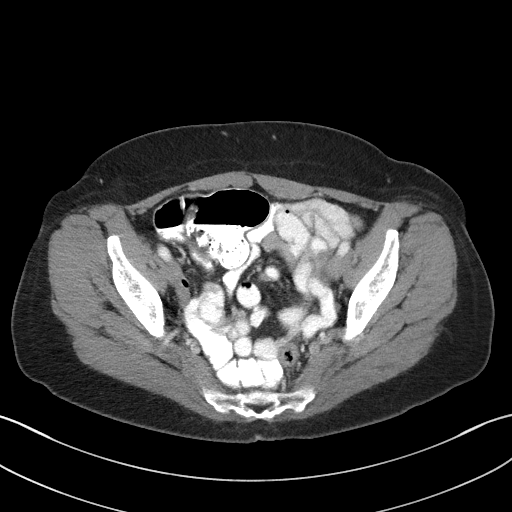
[im 33/52  soft-tissue]
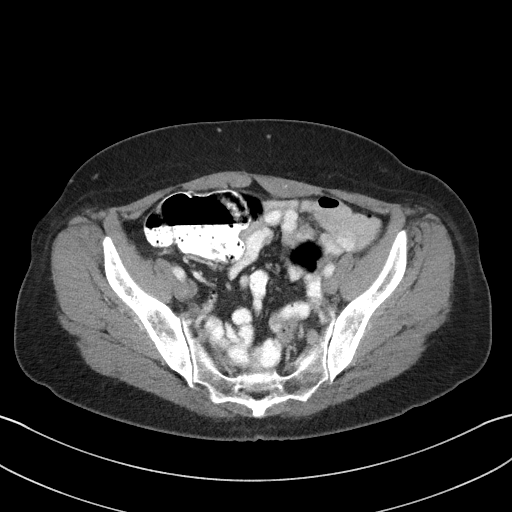
[im 33/52  bone]
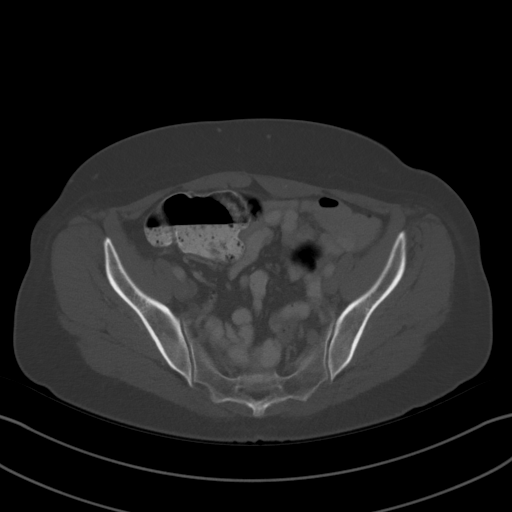
[im 37/52  soft-tissue]
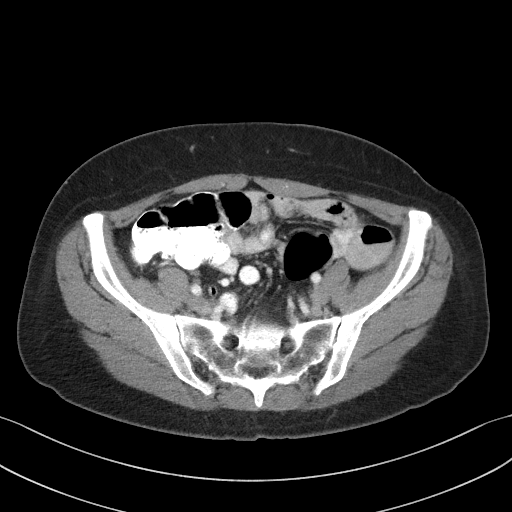
[im 42/52  soft-tissue]
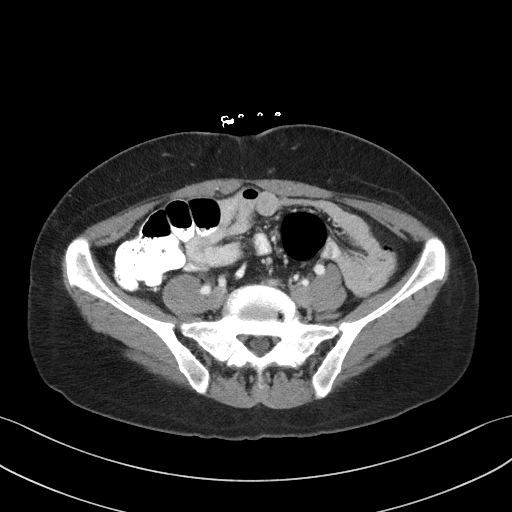
[im 45/52  soft-tissue]
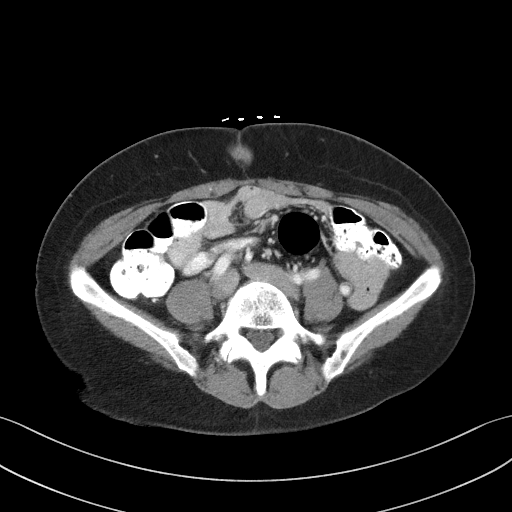
[im 48/52  soft-tissue]
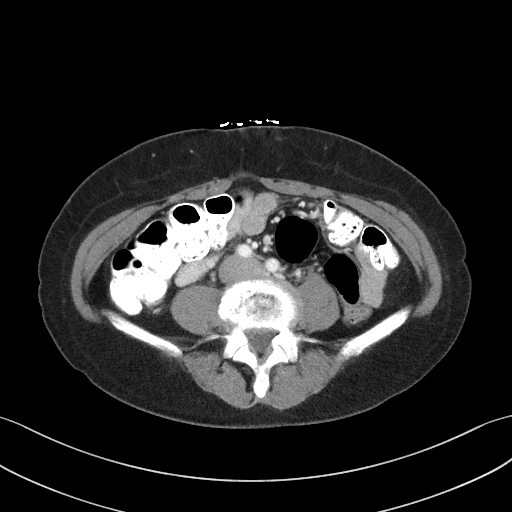

[Series 4: coronal st · coronal · 0.53mm/px · 3 of 77 slices shown]
[im 26/77  soft-tissue]
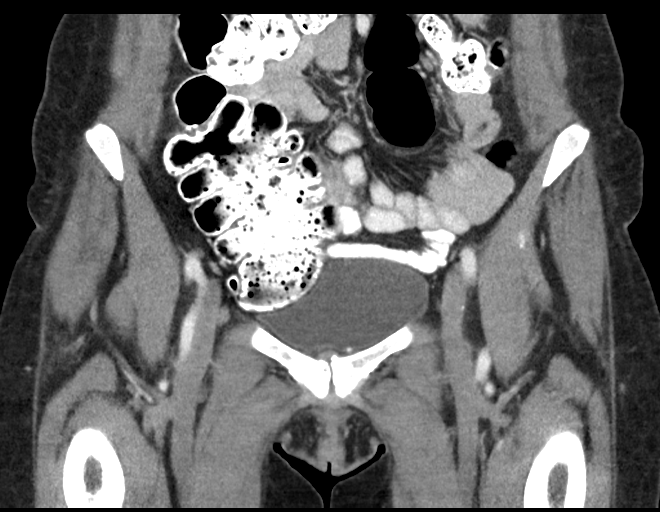
[im 34/77  soft-tissue]
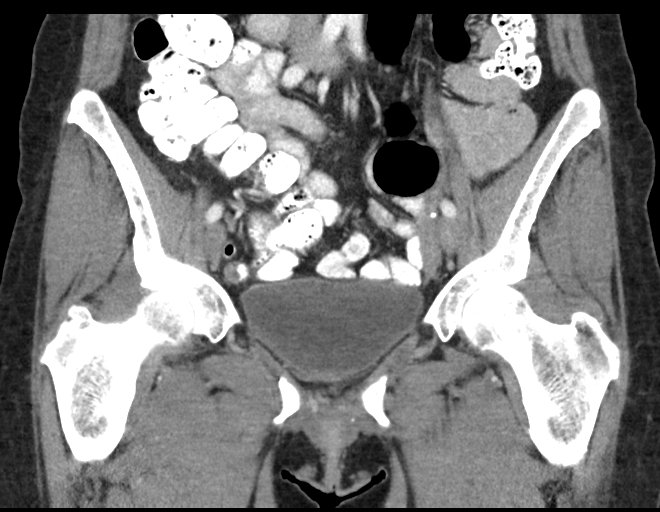
[im 43/77  soft-tissue]
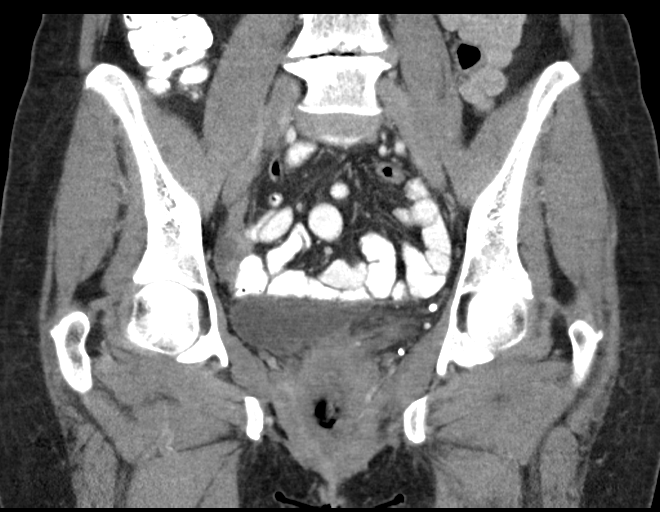

[16 of 46 positions shown; findings below may reference images not displayed]

FINDINGS: Urinary Tract:  Urinary bladder unremarkable.

Bowel:  Visualized small bowel unremarkable.  Normal appendix.

The cecum is unremarkable.

Rim enhancing fluid collection adjacent to the rectum, inseparable
from the wall of the rectum measures 3 cm on the axial images,
approximately 4.8 cm in cranial caudal span on the coronal images.
There is asymmetric wall thickening of the rectum on image 44 series
2, at the level of the abscess. Associated inflammatory changes in
the perirectal fat. Small lymph nodes are present along the left
perirectal fat.

There is a lymph node adjacent to the rectum on image 33 of series 2
which measures 13 mm. This was present on the comparison CT October 2018, and appears to have enlarged slightly in the interval.

Vascular/Lymphatic: No significant atherosclerotic changes.

Small lymph nodes within the fat in the perirectal region.

Reproductive:  Hysterectomy

Other:  None

Musculoskeletal: No acute displaced fracture.
IMPRESSION: Perirectal abscess, at the left posterior aspect of the rectum,
inseparable from the wall of the colon and the adjacent pelvic
musculature. Largest diameter on cranial caudal imaging measures
cm.

Asymmetric thickening of the rectum associated with the abscess. A
perforated rectal carcinoma is favored given the appearance, and
correlation with physical exam as well anoscopy/rigid sigmoidoscopy.
Alternatively, the changes may reflect chronic inflammation in the
setting of inflammatory bowel disease.

There are several small lymph nodes within the perirectal fat and
along the left pelvis, the largest measuring 13 mm, potentially
pathologic in the setting of carcinoma, or alternatively reactive.

## 2021-11-06 ENCOUNTER — Telehealth: Payer: Self-pay | Admitting: Hematology

## 2021-11-06 NOTE — Telephone Encounter (Signed)
Rescheduled upcoming appointment per provider's request. Patient is aware of changes.

## 2021-11-08 ENCOUNTER — Inpatient Hospital Stay: Payer: Self-pay | Admitting: Hematology

## 2021-11-08 ENCOUNTER — Inpatient Hospital Stay: Payer: Self-pay

## 2021-11-12 ENCOUNTER — Encounter: Payer: Self-pay | Admitting: Hematology

## 2021-11-12 ENCOUNTER — Inpatient Hospital Stay: Payer: Medicare HMO | Attending: Hematology

## 2021-11-12 ENCOUNTER — Inpatient Hospital Stay (HOSPITAL_BASED_OUTPATIENT_CLINIC_OR_DEPARTMENT_OTHER): Payer: Medicare HMO | Admitting: Hematology

## 2021-11-12 ENCOUNTER — Other Ambulatory Visit: Payer: Self-pay

## 2021-11-12 VITALS — BP 133/77 | HR 85 | Temp 98.5°F | Resp 18 | Ht 65.0 in | Wt 179.1 lb

## 2021-11-12 DIAGNOSIS — I10 Essential (primary) hypertension: Secondary | ICD-10-CM | POA: Diagnosis not present

## 2021-11-12 DIAGNOSIS — C210 Malignant neoplasm of anus, unspecified: Secondary | ICD-10-CM

## 2021-11-12 DIAGNOSIS — Z85048 Personal history of other malignant neoplasm of rectum, rectosigmoid junction, and anus: Secondary | ICD-10-CM | POA: Diagnosis present

## 2021-11-12 DIAGNOSIS — Z1231 Encounter for screening mammogram for malignant neoplasm of breast: Secondary | ICD-10-CM | POA: Diagnosis not present

## 2021-11-12 DIAGNOSIS — C20 Malignant neoplasm of rectum: Secondary | ICD-10-CM

## 2021-11-12 DIAGNOSIS — K589 Irritable bowel syndrome without diarrhea: Secondary | ICD-10-CM | POA: Insufficient documentation

## 2021-11-12 DIAGNOSIS — Z08 Encounter for follow-up examination after completed treatment for malignant neoplasm: Secondary | ICD-10-CM | POA: Diagnosis present

## 2021-11-12 DIAGNOSIS — D649 Anemia, unspecified: Secondary | ICD-10-CM | POA: Diagnosis not present

## 2021-11-12 DIAGNOSIS — D72819 Decreased white blood cell count, unspecified: Secondary | ICD-10-CM | POA: Insufficient documentation

## 2021-11-12 LAB — CMP (CANCER CENTER ONLY)
ALT: 34 U/L (ref 0–44)
AST: 21 U/L (ref 15–41)
Albumin: 4.3 g/dL (ref 3.5–5.0)
Alkaline Phosphatase: 74 U/L (ref 38–126)
Anion gap: 6 (ref 5–15)
BUN: 15 mg/dL (ref 8–23)
CO2: 26 mmol/L (ref 22–32)
Calcium: 9.3 mg/dL (ref 8.9–10.3)
Chloride: 105 mmol/L (ref 98–111)
Creatinine: 0.7 mg/dL (ref 0.44–1.00)
GFR, Estimated: >60 mL/min (ref >60)
Glucose, Bld: 102 mg/dL — ABNORMAL HIGH (ref 70–99)
Potassium: 4.1 mmol/L (ref 3.5–5.1)
Sodium: 137 mmol/L (ref 135–145)
Total Bilirubin: 0.3 mg/dL (ref 0.3–1.2)
Total Protein: 7.3 g/dL (ref 6.5–8.1)

## 2021-11-12 LAB — CBC WITH DIFFERENTIAL (CANCER CENTER ONLY)
Abs Immature Granulocytes: 0 10*3/uL (ref 0.00–0.07)
Basophils Absolute: 0 10*3/uL (ref 0.0–0.1)
Basophils Relative: 1 %
Eosinophils Absolute: 0 10*3/uL (ref 0.0–0.5)
Eosinophils Relative: 2 %
HCT: 32.9 % — ABNORMAL LOW (ref 36.0–46.0)
Hemoglobin: 11.1 g/dL — ABNORMAL LOW (ref 12.0–15.0)
Immature Granulocytes: 0 %
Lymphocytes Relative: 38 %
Lymphs Abs: 1 10*3/uL (ref 0.7–4.0)
MCH: 29.7 pg (ref 26.0–34.0)
MCHC: 33.7 g/dL (ref 30.0–36.0)
MCV: 88 fL (ref 80.0–100.0)
Monocytes Absolute: 0.3 10*3/uL (ref 0.1–1.0)
Monocytes Relative: 11 %
Neutro Abs: 1.2 10*3/uL — ABNORMAL LOW (ref 1.7–7.7)
Neutrophils Relative %: 48 %
Platelet Count: 311 10*3/uL (ref 150–400)
RBC: 3.74 MIL/uL — ABNORMAL LOW (ref 3.87–5.11)
RDW: 12.8 % (ref 11.5–15.5)
WBC Count: 2.5 10*3/uL — ABNORMAL LOW (ref 4.0–10.5)
nRBC: 0 % (ref 0.0–0.2)

## 2021-11-12 LAB — IRON AND IRON BINDING CAPACITY (CC-WL,HP ONLY)
Iron: 81 ug/dL (ref 28–170)
Saturation Ratios: 26 % (ref 10.4–31.8)
TIBC: 314 ug/dL (ref 250–450)
UIBC: 233 ug/dL (ref 148–442)

## 2021-11-12 LAB — FERRITIN: Ferritin: 317 ng/mL — ABNORMAL HIGH (ref 11–307)

## 2021-11-13 ENCOUNTER — Telehealth: Payer: Self-pay | Admitting: Hematology

## 2021-11-15 ENCOUNTER — Ambulatory Visit
Admission: RE | Admit: 2021-11-15 | Discharge: 2021-11-15 | Disposition: A | Discharge status: Home / Self Care | Payer: Medicare HMO | Source: Ambulatory Visit | Attending: Hematology | Admitting: Hematology

## 2021-11-15 DIAGNOSIS — Z1231 Encounter for screening mammogram for malignant neoplasm of breast: Secondary | ICD-10-CM

## 2021-12-10 ENCOUNTER — Other Ambulatory Visit: Payer: Self-pay

## 2021-12-18 ENCOUNTER — Other Ambulatory Visit: Payer: Self-pay

## 2022-05-06 ENCOUNTER — Other Ambulatory Visit: Payer: Medicare HMO

## 2022-05-07 ENCOUNTER — Other Ambulatory Visit: Payer: Self-pay

## 2022-05-07 ENCOUNTER — Ambulatory Visit (HOSPITAL_COMMUNITY)
Admission: RE | Admit: 2022-05-07 | Discharge: 2022-05-07 | Disposition: A | Discharge status: Home / Self Care | Payer: Medicare HMO | Source: Ambulatory Visit | Attending: Hematology | Admitting: Hematology

## 2022-05-07 ENCOUNTER — Inpatient Hospital Stay: Payer: Medicare HMO | Attending: Hematology

## 2022-05-07 DIAGNOSIS — C20 Malignant neoplasm of rectum: Secondary | ICD-10-CM

## 2022-05-07 DIAGNOSIS — C210 Malignant neoplasm of anus, unspecified: Secondary | ICD-10-CM | POA: Insufficient documentation

## 2022-05-07 DIAGNOSIS — Z08 Encounter for follow-up examination after completed treatment for malignant neoplasm: Secondary | ICD-10-CM | POA: Insufficient documentation

## 2022-05-07 DIAGNOSIS — Z85048 Personal history of other malignant neoplasm of rectum, rectosigmoid junction, and anus: Secondary | ICD-10-CM | POA: Insufficient documentation

## 2022-05-07 DIAGNOSIS — D649 Anemia, unspecified: Secondary | ICD-10-CM

## 2022-05-07 LAB — CBC WITH DIFFERENTIAL (CANCER CENTER ONLY)
Abs Immature Granulocytes: 0.01 10*3/uL (ref 0.00–0.07)
Basophils Absolute: 0 10*3/uL (ref 0.0–0.1)
Basophils Relative: 1 %
Eosinophils Absolute: 0.1 10*3/uL (ref 0.0–0.5)
Eosinophils Relative: 2 %
HCT: 34.6 % — ABNORMAL LOW (ref 36.0–46.0)
Hemoglobin: 11.5 g/dL — ABNORMAL LOW (ref 12.0–15.0)
Immature Granulocytes: 0 %
Lymphocytes Relative: 46 %
Lymphs Abs: 1.9 10*3/uL (ref 0.7–4.0)
MCH: 29.6 pg (ref 26.0–34.0)
MCHC: 33.2 g/dL (ref 30.0–36.0)
MCV: 88.9 fL (ref 80.0–100.0)
Monocytes Absolute: 0.4 10*3/uL (ref 0.1–1.0)
Monocytes Relative: 10 %
Neutro Abs: 1.7 10*3/uL (ref 1.7–7.7)
Neutrophils Relative %: 41 %
Platelet Count: 319 10*3/uL (ref 150–400)
RBC: 3.89 MIL/uL (ref 3.87–5.11)
RDW: 12.9 % (ref 11.5–15.5)
WBC Count: 4.1 10*3/uL (ref 4.0–10.5)
nRBC: 0 % (ref 0.0–0.2)

## 2022-05-07 LAB — IRON AND IRON BINDING CAPACITY (CC-WL,HP ONLY)
Iron: 84 ug/dL (ref 28–170)
Saturation Ratios: 26 % (ref 10.4–31.8)
TIBC: 325 ug/dL (ref 250–450)
UIBC: 241 ug/dL (ref 148–442)

## 2022-05-07 LAB — CMP (CANCER CENTER ONLY)
ALT: 25 U/L (ref 0–44)
AST: 20 U/L (ref 15–41)
Albumin: 4.1 g/dL (ref 3.5–5.0)
Alkaline Phosphatase: 74 U/L (ref 38–126)
Anion gap: 9 (ref 5–15)
BUN: 16 mg/dL (ref 8–23)
CO2: 28 mmol/L (ref 22–32)
Calcium: 9.1 mg/dL (ref 8.9–10.3)
Chloride: 100 mmol/L (ref 98–111)
Creatinine: 0.78 mg/dL (ref 0.44–1.00)
GFR, Estimated: >60 mL/min (ref >60)
Glucose, Bld: 100 mg/dL — ABNORMAL HIGH (ref 70–99)
Potassium: 4 mmol/L (ref 3.5–5.1)
Sodium: 137 mmol/L (ref 135–145)
Total Bilirubin: <0.1 mg/dL — ABNORMAL LOW (ref 0.3–1.2)
Total Protein: 8.1 g/dL (ref 6.5–8.1)

## 2022-05-07 MED ORDER — IOHEXOL 300 MG/ML  SOLN
100.0000 mL | Freq: Once | INTRAMUSCULAR | Status: AC | PRN
  Administered 2022-05-07: 100 mL via INTRAVENOUS

## 2022-05-08 LAB — FERRITIN: Ferritin: 312 ng/mL — ABNORMAL HIGH (ref 11–307)

## 2022-05-08 NOTE — Progress Notes (Signed)
[facility]   Telephone:(336) [phone] Fax:(336) [phone]   Clinic Follow up Note   Patient Care Team: Loura Back, NP as PCP - General (Nurse Practitioner) Griselda Miner, MD as Consulting Physician (General Surgery) Andria Meuse, MD as Consulting Physician (General Surgery) Dorothy Puffer, MD as Consulting Physician (Radiation Oncology) Malachy Mood, MD as Consulting Physician (Hematology) Barrie Folk, RN as Oncology Nurse Navigator Pollyann Samples, NP as Nurse Practitioner (Nurse Practitioner)  Date of Service:  05/09/2022  CHIEF COMPLAINT: f/u of rectal cancer   CURRENT THERAPY:  Surveillance   ASSESSMENT:  Leah Bailey is a 66 y.o. female with   Malignant neoplasm of anus (HCC) cT2N1aM0 stage IIIA   -diagnosed in 12/2018. Staging CT showed low rectal wall thickening with abscess and surrounding lymphadenopathy, largest up to 13mm. PET shows no distant metastasis.  -She completed concurrent chemoRT with Mitomycin and 5FU 10/5-11/13/20. PET from 07/2019 shows near complete response to treatment. She is currently on Surveillance.  -She is clinically doing well, no concern for recurrence. -She is 3 years out from initial diagnosis, I do not plan to repeat a more surveillance CT scan.  Will follow-up for 2 more years.  PLAN: -Patient is clinically doing well, exam was unremarkable, lab reviewed.  No clinical concern for recurrence -Reviewed surveillance CT scan image from yesterday, no evidence of recurrence. -Follow-up in 6 months with lab.  SUMMARY OF ONCOLOGIC HISTORY: Oncology History Overview Note  Cancer Staging Squamous cell carcinoma of rectum [facility]) Staging form: Colon and Rectum, AJCC 8th Edition - Clinical stage from 02/04/2019: Stage IIIA (cT2, cN1a, cM0) - Signed by Malachy Mood, MD on 02/04/2019     Malignant neoplasm of anus (HCC)  01/13/2019 Initial Diagnosis   Squamous cell carcinoma of rectum (HCC)   01/14/2019 Imaging   CT Pelvis W  Contrast 01/14/19 IMPRESSION: Perirectal abscess, at the left posterior aspect of the rectum, inseparable from the wall of the colon and the adjacent pelvic musculature. Largest diameter on cranial caudal imaging measures 4.8 cm.   Asymmetric thickening of the rectum associated with the abscess. A perforated rectal carcinoma is favored given the appearance, and correlation with physical exam as well anoscopy/rigid sigmoidoscopy. Alternatively, the changes may reflect chronic inflammation in the setting of inflammatory bowel disease.   There are several small lymph nodes within the perirectal fat and along the left pelvis, the largest measuring 13 mm, potentially pathologic in the setting of carcinoma, or alternatively reactive.   01/15/2019 Initial Diagnosis   Diagnosis 01/15/19 Soft tissue, abscess, rectal wall - INVASIVE SQUAMOUS CELL CARCINOMA, MODERATELY TO POORLY DIFFERENTIATED.   02/03/2019 Imaging   CT Chest W Contrast 02/03/19  IMPRESSION: 1. Two nodules each measuring about 4 mm in long axis noted in the right upper lobe. One of these is subpleural. These are likely benign but given the context, surveillance imaging in 6-12 months time should be considered. Fleischner criteria for follow do not directly apply given the history of malignancy.   02/04/2019 Cancer Staging   Staging form: Colon and Rectum, AJCC 8th Edition - Clinical stage from 02/04/2019: Stage IIIA (cT2, cN1a, cM0) - Signed by Malachy Mood, MD on 02/04/2019   02/15/2019 PET scan   IMPRESSION: 1. Hypermetabolic eccentric rectal wall thickening with hypermetabolic left perirectal adenopathy. No evidence of distant metastatic disease. 2. Possible gallbladder sludge. 3. Left renal stone.   02/22/2019 - 03/22/2019 Chemotherapy   ChemoRT with Mitomycin/5FU week 1 and 5 starting 02/22/19 and ending 03/22/19  02/22/2019 - 04/02/2019 Radiation Therapy   ChemoRT with Dr. Roselind Messier 02/22/19-04/02/19   07/19/2019 PET scan    IMPRESSION: 1. Interval marked improvement in the previously demonstrated hypermetabolic anorectal wall thickening and perirectal adenopathy. Mild residual activity at the anal verge is nonspecific and within physiologic limits. 2. No evidence of metastatic disease. 3. No acute findings.   09/20/2019 Survivorship   SCP delivered via virtual appointment by Santiago Glad, NP    05/16/2020 Imaging   CT CAP  IMPRESSION: 1. No evidence of metastatic disease in the chest, abdomen or pelvis. 2. Mild circumferential wall thickening in the lower rectum is slightly decreased from 07/19/2019 PET-CT, with no discrete residual rectal mass, favoring post treatment change. 3. Trace free fluid in the deep pelvis, nonspecific. 4. Mild left colonic diverticulosis.   05/08/2021 Imaging   EXAM: CT CHEST, ABDOMEN, AND PELVIS WITH CONTRAST  IMPRESSION: No evidence of recurrent or metastatic carcinoma. No other acute findings.   Colonic diverticulosis. No radiographic evidence of diverticulitis.   Aortic Atherosclerosis (ICD10-I70.0).      INTERVAL HISTORY:  Leah Bailey is here for a follow up of  rectal cancer She was last seen by me on 11/12/21 She presents to the clinic alone.  He is clinically doing well, denies any abdominal pain, nausea, bloating, or change of bowel habit.  No hematochezia.  She has good appetite and energy level, weight is stable.    All other systems were reviewed with the patient and are negative.  MEDICAL HISTORY:  Past Medical History:  Diagnosis Date   Herpes simplex disciform keratitis 10/20/2013   Hypertension    IBD (inflammatory bowel disease) - colitis 01/11/2018   Nuclear sclerosis of both eyes 11/30/2014   Squamous cell carcinoma of rectum (HCC) 01/13/2019   UC (ulcerative colitis) (HCC)     SURGICAL HISTORY: Past Surgical History:  Procedure Laterality Date   ABDOMINAL HYSTERECTOMY     BREAST CYST ASPIRATION Left 1983   BREAST LUMPECTOMY Left     COLONOSCOPY     INCISION AND DRAINAGE PERIRECTAL ABSCESS N/A 01/15/2019   Procedure: INTERNAL DRAINAGE OF PERIRECTAL ABSCESS;  Surgeon: Griselda Miner, MD;  Location: [facility];  Service: General;  Laterality: N/A;   TUBAL LIGATION      I have reviewed the social history and family history with the patient and they are unchanged from previous note.  ALLERGIES:  has No Known Allergies.  MEDICATIONS:  Current Outpatient Medications  Medication Sig Dispense Refill   aspirin 81 MG tablet Take 81 mg by mouth daily.     lisinopril-hydrochlorothiazide (PRINZIDE,ZESTORETIC) 20-25 MG tablet Take 1 tablet by mouth daily. 90 tablet 3   Multiple Vitamins-Calcium (ONE-A-DAY WOMENS PO) Take 1 tablet by mouth daily.     No current facility-administered medications for this visit.    PHYSICAL EXAMINATION: ECOG PERFORMANCE STATUS: 0 - Asymptomatic  Vitals:   05/09/22 0813  BP: (!) 145/91  Pulse: 71  Resp: 18  Temp: 98.4 F (36.9 C)  SpO2: 99%   Wt Readings from Last 3 Encounters:  05/09/22 186 lb (84.4 kg)  11/12/21 179 lb 1.6 oz (81.2 kg)  05/10/21 183 lb 12.8 oz (83.4 kg)     GENERAL:alert, no distress and comfortable SKIN: skin color, texture, turgor are normal, no rashes or significant lesions EYES: normal, Conjunctiva are pink and non-injected, sclera clear NECK: supple, thyroid normal size, non-tender, without nodularity LYMPH:  no palpable lymphadenopathy in the cervical, axillary or inguinal LUNGS: clear to auscultation and  percussion with normal breathing effort HEART: regular rate & rhythm and no murmurs and no lower extremity edema ABDOMEN:abdomen soft, non-tender and normal bowel sounds, rectal exam was unremarkable Musculoskeletal:no cyanosis of digits and no clubbing  NEURO: alert & oriented x 3 with fluent speech, no focal motor/sensory deficits  LABORATORY DATA:  I have reviewed the data as listed    Latest Ref Rng & Units 05/07/2022    3:26 PM 11/12/2021    8:47 AM  05/08/2021    7:22 AM  CBC  WBC 4.0 - 10.5 K/uL 4.1  2.5  3.1   Hemoglobin 12.0 - 15.0 g/dL 14.7  82.9  56.2   Hematocrit 36.0 - 46.0 % 34.6  32.9  35.3   Platelets 150 - 400 K/uL 319  311  292         Latest Ref Rng & Units 05/07/2022    3:26 PM 11/12/2021    8:47 AM 05/08/2021    7:22 AM  CMP  Glucose 70 - 99 mg/dL 130  865  98   BUN 8 - 23 mg/dL 16  15  14    Creatinine 0.44 - 1.00 mg/dL 7.84  6.96  2.95   Sodium 135 - 145 mmol/L 137  137  139   Potassium 3.5 - 5.1 mmol/L 4.0  4.1  3.8   Chloride 98 - 111 mmol/L 100  105  104   CO2 22 - 32 mmol/L 28  26  26    Calcium 8.9 - 10.3 mg/dL 9.1  9.3  9.1   Total Protein 6.5 - 8.1 g/dL 8.1  7.3  7.5   Total Bilirubin 0.3 - 1.2 mg/dL <2.8  0.3  0.3   Alkaline Phos 38 - 126 U/L 74  74  75   AST 15 - 41 U/L 20  21  16    ALT 0 - 44 U/L 25  34  23       RADIOGRAPHIC STUDIES: I have personally reviewed the radiological images as listed and agreed with the findings in the report. CT CHEST ABDOMEN PELVIS W CONTRAST  Result Date: 05/09/2022 CLINICAL DATA:  Anorectal cancer restaging * Tracking Code: BO * EXAM: CT CHEST, ABDOMEN, AND PELVIS WITH CONTRAST TECHNIQUE: Multidetector CT imaging of the chest, abdomen and pelvis was performed following the standard protocol during bolus administration of intravenous contrast. RADIATION DOSE REDUCTION: This exam was performed according to the departmental dose-optimization program which includes automated exposure control, adjustment of the mA and/or kV according to patient size and/or use of iterative reconstruction technique. CONTRAST:  OMNIPAQUE IOHEXOL 300 MG/ML SOLN additional oral enteric contrast COMPARISON:  05/08/2021 FINDINGS: CT CHEST FINDINGS Cardiovascular: No significant vascular findings. Normal heart size. No pericardial effusion. Mediastinum/Nodes: No enlarged mediastinal, hilar, or axillary lymph nodes. Thymic remnant in the anterior mediastinum. Thyroid gland, trachea, and  esophagus demonstrate no significant findings. Lungs/Pleura: Unchanged, benign 0.3 cm nodule of the right pulmonary apex (series 6, image 26). No pleural effusion or pneumothorax. Musculoskeletal: No chest wall abnormality. No acute osseous findings. CT ABDOMEN PELVIS FINDINGS Hepatobiliary: No solid liver abnormality is seen. Contracted gallbladder. No gallstones, gallbladder wall thickening, or biliary dilatation. Pancreas: Unremarkable. No pancreatic ductal dilatation or surrounding inflammatory changes. Spleen: Normal in size without significant abnormality. Adrenals/Urinary Tract: Adrenal glands are unremarkable. Kidneys are normal, without renal calculi, solid lesion, or hydronephrosis. Bladder is unremarkable. Stomach/Bowel: Stomach is within normal limits. Appendix appears normal. Occasional descending diverticula. Unchanged appearance of the low rectum and anus, without  obvious mass (series 2, image 114). Vascular/Lymphatic: No significant vascular findings are present. No enlarged abdominal or pelvic lymph nodes. Reproductive: Status post hysterectomy. Other: No abdominal wall hernia or abnormality. No ascites. Musculoskeletal: No acute osseous findings. IMPRESSION: 1. Unchanged appearance of the low rectum and anus, without obvious mass at this time. 2. No evidence of lymphadenopathy or metastatic disease in the chest, abdomen, or pelvis. Electronically Signed   By: Jearld Lesch M.D.   On: 05/09/2022 08:52      No orders of the defined types were placed in this encounter.  All questions were answered. The patient knows to call the clinic with any problems, questions or concerns. No barriers to learning was detected. The total time spent in the appointment was 30 minutes.     Malachy Mood, MD 05/09/2022   Carolin Coy, CMA, am acting as scribe for Malachy Mood, MD.   I have reviewed the above documentation for accuracy and completeness, and I agree with the above.

## 2022-05-08 NOTE — Assessment & Plan Note (Signed)
cT2N1aM0 stage IIIA   -diagnosed in 12/2018. Staging CT showed low rectal wall thickening with abscess and surrounding lymphadenopathy, largest up to 13m. PET shows no distant metastasis.  -She completed concurrent chemoRT with Mitomycin and 5FU 10/5-11/13/20. PET from 07/2019 shows near complete response to treatment. She is currently on Surveillance.

## 2022-05-09 ENCOUNTER — Encounter: Payer: Self-pay | Admitting: Hematology

## 2022-05-09 ENCOUNTER — Other Ambulatory Visit: Payer: Self-pay

## 2022-05-09 ENCOUNTER — Inpatient Hospital Stay: Payer: Medicare HMO | Admitting: Hematology

## 2022-05-09 VITALS — BP 145/91 | HR 71 | Temp 98.4°F | Resp 18 | Ht 65.0 in | Wt 186.0 lb

## 2022-05-09 DIAGNOSIS — C210 Malignant neoplasm of anus, unspecified: Secondary | ICD-10-CM | POA: Diagnosis not present

## 2022-05-09 DIAGNOSIS — Z85048 Personal history of other malignant neoplasm of rectum, rectosigmoid junction, and anus: Secondary | ICD-10-CM | POA: Diagnosis present

## 2022-05-09 DIAGNOSIS — Z08 Encounter for follow-up examination after completed treatment for malignant neoplasm: Secondary | ICD-10-CM | POA: Diagnosis present

## 2022-08-07 ENCOUNTER — Other Ambulatory Visit: Payer: Self-pay | Admitting: Hematology

## 2022-08-07 DIAGNOSIS — C210 Malignant neoplasm of anus, unspecified: Secondary | ICD-10-CM

## 2022-11-06 ENCOUNTER — Inpatient Hospital Stay: Payer: Medicare HMO | Attending: Hematology

## 2022-11-06 ENCOUNTER — Inpatient Hospital Stay: Payer: Medicare HMO | Admitting: Hematology

## 2022-11-06 ENCOUNTER — Encounter: Payer: Self-pay | Admitting: Hematology

## 2022-11-06 ENCOUNTER — Other Ambulatory Visit: Payer: Self-pay

## 2022-11-06 VITALS — BP 117/70 | HR 62 | Temp 98.3°F | Resp 14 | Ht 65.0 in | Wt 171.9 lb

## 2022-11-06 DIAGNOSIS — Z85048 Personal history of other malignant neoplasm of rectum, rectosigmoid junction, and anus: Secondary | ICD-10-CM | POA: Diagnosis not present

## 2022-11-06 DIAGNOSIS — C210 Malignant neoplasm of anus, unspecified: Secondary | ICD-10-CM | POA: Diagnosis not present

## 2022-11-06 DIAGNOSIS — C20 Malignant neoplasm of rectum: Secondary | ICD-10-CM

## 2022-11-06 LAB — CBC WITH DIFFERENTIAL (CANCER CENTER ONLY)
Abs Immature Granulocytes: 0 10*3/uL (ref 0.00–0.07)
Basophils Absolute: 0 10*3/uL (ref 0.0–0.1)
Basophils Relative: 1 %
Eosinophils Absolute: 0.1 10*3/uL (ref 0.0–0.5)
Eosinophils Relative: 3 %
HCT: 32.3 % — ABNORMAL LOW (ref 36.0–46.0)
Hemoglobin: 10.7 g/dL — ABNORMAL LOW (ref 12.0–15.0)
Immature Granulocytes: 0 %
Lymphocytes Relative: 38 %
Lymphs Abs: 1.2 10*3/uL (ref 0.7–4.0)
MCH: 29.4 pg (ref 26.0–34.0)
MCHC: 33.1 g/dL (ref 30.0–36.0)
MCV: 88.7 fL (ref 80.0–100.0)
Monocytes Absolute: 0.3 10*3/uL (ref 0.1–1.0)
Monocytes Relative: 10 %
Neutro Abs: 1.6 10*3/uL — ABNORMAL LOW (ref 1.7–7.7)
Neutrophils Relative %: 48 %
Platelet Count: 275 10*3/uL (ref 150–400)
RBC: 3.64 MIL/uL — ABNORMAL LOW (ref 3.87–5.11)
RDW: 13 % (ref 11.5–15.5)
WBC Count: 3.3 10*3/uL — ABNORMAL LOW (ref 4.0–10.5)
nRBC: 0 % (ref 0.0–0.2)

## 2022-11-06 LAB — CMP (CANCER CENTER ONLY)
ALT: 26 U/L (ref 0–44)
AST: 18 U/L (ref 15–41)
Albumin: 4.1 g/dL (ref 3.5–5.0)
Alkaline Phosphatase: 83 U/L (ref 38–126)
Anion gap: 5 (ref 5–15)
BUN: 17 mg/dL (ref 8–23)
CO2: 26 mmol/L (ref 22–32)
Calcium: 9.3 mg/dL (ref 8.9–10.3)
Chloride: 107 mmol/L (ref 98–111)
Creatinine: 0.76 mg/dL (ref 0.44–1.00)
GFR, Estimated: >60 mL/min (ref >60)
Glucose, Bld: 116 mg/dL — ABNORMAL HIGH (ref 70–99)
Potassium: 4.6 mmol/L (ref 3.5–5.1)
Sodium: 138 mmol/L (ref 135–145)
Total Bilirubin: 0.3 mg/dL (ref 0.3–1.2)
Total Protein: 7 g/dL (ref 6.5–8.1)

## 2022-11-06 NOTE — Progress Notes (Deleted)
[facility]   Telephone:(336) [phone] Fax:(336) [phone]   Clinic Follow up Note   Patient Care Team: Loura Back, NP as PCP - General (Nurse Practitioner) Griselda Miner, MD as Consulting Physician (General Surgery) Andria Meuse, MD as Consulting Physician (General Surgery) Dorothy Puffer, MD as Consulting Physician (Radiation Oncology) Malachy Mood, MD as Consulting Physician (Hematology) Barrie Folk, RN as Oncology Nurse Navigator Pollyann Samples, NP as Nurse Practitioner (Nurse Practitioner)  Date of Service:  11/06/2022  CHIEF COMPLAINT: f/u of rectal cancer    CURRENT THERAPY:  Surveillance    ASSESSMENT: *** Leah Bailey is a 67 y.o. female with   No problem-specific Assessment & Plan notes found for this encounter.  ***   PLAN:      SUMMARY OF ONCOLOGIC HISTORY: Oncology History Overview Note  Cancer Staging Squamous cell carcinoma of rectum (HCC) Staging form: Colon and Rectum, AJCC 8th Edition - Clinical stage from 02/04/2019: Stage IIIA (cT2, cN1a, cM0) - Signed by Malachy Mood, MD on 02/04/2019     Malignant neoplasm of anus (HCC)  01/13/2019 Initial Diagnosis   Squamous cell carcinoma of rectum (HCC)   01/14/2019 Imaging   CT Pelvis W Contrast 01/14/19 IMPRESSION: Perirectal abscess, at the left posterior aspect of the rectum, inseparable from the wall of the colon and the adjacent pelvic musculature. Largest diameter on cranial caudal imaging measures 4.8 cm.   Asymmetric thickening of the rectum associated with the abscess. A perforated rectal carcinoma is favored given the appearance, and correlation with physical exam as well anoscopy/rigid sigmoidoscopy. Alternatively, the changes may reflect chronic inflammation in the setting of inflammatory bowel disease.   There are several small lymph nodes within the perirectal fat and along the left pelvis, the largest measuring 13 mm, potentially pathologic in the setting of  carcinoma, or alternatively reactive.   01/15/2019 Initial Diagnosis   Diagnosis 01/15/19 Soft tissue, abscess, rectal wall - INVASIVE SQUAMOUS CELL CARCINOMA, MODERATELY TO POORLY DIFFERENTIATED.   02/03/2019 Imaging   CT Chest W Contrast 02/03/19  IMPRESSION: 1. Two nodules each measuring about 4 mm in long axis noted in the right upper lobe. One of these is subpleural. These are likely benign but given the context, surveillance imaging in 6-12 months time should be considered. Fleischner criteria for follow do not directly apply given the history of malignancy.   02/04/2019 Cancer Staging   Staging form: Colon and Rectum, AJCC 8th Edition - Clinical stage from 02/04/2019: Stage IIIA (cT2, cN1a, cM0) - Signed by Malachy Mood, MD on 02/04/2019   02/15/2019 PET scan   IMPRESSION: 1. Hypermetabolic eccentric rectal wall thickening with hypermetabolic left perirectal adenopathy. No evidence of distant metastatic disease. 2. Possible gallbladder sludge. 3. Left renal stone.   02/22/2019 - 03/22/2019 Chemotherapy   ChemoRT with Mitomycin/5FU week 1 and 5 starting 02/22/19 and ending 03/22/19   02/22/2019 - 04/02/2019 Radiation Therapy   ChemoRT with Dr. Roselind Messier 02/22/19-04/02/19   07/19/2019 PET scan   IMPRESSION: 1. Interval marked improvement in the previously demonstrated hypermetabolic anorectal wall thickening and perirectal adenopathy. Mild residual activity at the anal verge is nonspecific and within physiologic limits. 2. No evidence of metastatic disease. 3. No acute findings.   09/20/2019 Survivorship   SCP delivered via virtual appointment by Santiago Glad, NP    05/16/2020 Imaging   CT CAP  IMPRESSION: 1. No evidence of metastatic disease in the chest, abdomen or pelvis. 2. Mild circumferential wall thickening in the lower rectum is slightly  decreased from 07/19/2019 PET-CT, with no discrete residual rectal mass, favoring post treatment change. 3. Trace free fluid in the deep  pelvis, nonspecific. 4. Mild left colonic diverticulosis.   05/08/2021 Imaging   EXAM: CT CHEST, ABDOMEN, AND PELVIS WITH CONTRAST  IMPRESSION: No evidence of recurrent or metastatic carcinoma. No other acute findings.   Colonic diverticulosis. No radiographic evidence of diverticulitis.   Aortic Atherosclerosis (ICD10-I70.0).      INTERVAL HISTORY: *** Leah Bailey is here for a follow up of         All other systems were reviewed with the patient and are negative.  MEDICAL HISTORY:  Past Medical History:  Diagnosis Date   Herpes simplex disciform keratitis 10/20/2013   Hypertension    IBD (inflammatory bowel disease) - colitis 01/11/2018   Nuclear sclerosis of both eyes 11/30/2014   Squamous cell carcinoma of rectum (HCC) 01/13/2019   UC (ulcerative colitis) (HCC)     SURGICAL HISTORY: Past Surgical History:  Procedure Laterality Date   ABDOMINAL HYSTERECTOMY     BREAST CYST ASPIRATION Left 1983   BREAST LUMPECTOMY Left    COLONOSCOPY     INCISION AND DRAINAGE PERIRECTAL ABSCESS N/A 01/15/2019   Procedure: INTERNAL DRAINAGE OF PERIRECTAL ABSCESS;  Surgeon: Griselda Miner, MD;  Location: [facility];  Service: General;  Laterality: N/A;   TUBAL LIGATION      I have reviewed the social history and family history with the patient and they are unchanged from previous note.  ALLERGIES:  has No Known Allergies.  MEDICATIONS:  Current Outpatient Medications  Medication Sig Dispense Refill   aspirin 81 MG tablet Take 81 mg by mouth daily.     lisinopril-hydrochlorothiazide (PRINZIDE,ZESTORETIC) 20-25 MG tablet Take 1 tablet by mouth daily. 90 tablet 3   Multiple Vitamins-Calcium (ONE-A-DAY WOMENS PO) Take 1 tablet by mouth daily.     No current facility-administered medications for this visit.    PHYSICAL EXAMINATION: ECOG PERFORMANCE STATUS: {CHL ONC ECOG PS:[id number]}  There were no vitals filed for this visit. Wt Readings from Last 3 Encounters:   05/09/22 186 lb (84.4 kg)  11/12/21 179 lb 1.6 oz (81.2 kg)  05/10/21 183 lb 12.8 oz (83.4 kg)    {Only keep what was examined. If exam not performed, can use .CEXAM } GENERAL:alert, no distress and comfortable SKIN: skin color, texture, turgor are normal, no rashes or significant lesions EYES: normal, Conjunctiva are pink and non-injected, sclera clear {OROPHARYNX:no exudate, no erythema and lips, buccal mucosa, and tongue normal}  NECK: supple, thyroid normal size, non-tender, without nodularity LYMPH:  no palpable lymphadenopathy in the cervical, axillary {or inguinal} LUNGS: clear to auscultation and percussion with normal breathing effort HEART: regular rate & rhythm and no murmurs and no lower extremity edema ABDOMEN:abdomen soft, non-tender and normal bowel sounds Musculoskeletal:no cyanosis of digits and no clubbing  NEURO: alert & oriented x 3 with fluent speech, no focal motor/sensory deficits  LABORATORY DATA:  I have reviewed the data as listed    Latest Ref Rng & Units 05/07/2022    3:26 PM 11/12/2021    8:47 AM 05/08/2021    7:22 AM  CBC  WBC 4.0 - 10.5 K/uL 4.1  2.5  3.1   Hemoglobin 12.0 - 15.0 g/dL 16.1  09.6  04.5   Hematocrit 36.0 - 46.0 % 34.6  32.9  35.3   Platelets 150 - 400 K/uL 319  311  292         Latest Ref Rng &  Units 05/07/2022    3:26 PM 11/12/2021    8:47 AM 05/08/2021    7:22 AM  CMP  Glucose 70 - 99 mg/dL 016  010  98   BUN 8 - 23 mg/dL 16  15  14    Creatinine 0.44 - 1.00 mg/dL 9.32  3.55  7.32   Sodium 135 - 145 mmol/L 137  137  139   Potassium 3.5 - 5.1 mmol/L 4.0  4.1  3.8   Chloride 98 - 111 mmol/L 100  105  104   CO2 22 - 32 mmol/L 28  26  26    Calcium 8.9 - 10.3 mg/dL 9.1  9.3  9.1   Total Protein 6.5 - 8.1 g/dL 8.1  7.3  7.5   Total Bilirubin 0.3 - 1.2 mg/dL <2.0  0.3  0.3   Alkaline Phos 38 - 126 U/L 74  74  75   AST 15 - 41 U/L 20  21  16    ALT 0 - 44 U/L 25  34  23       RADIOGRAPHIC STUDIES: I have personally reviewed  the radiological images as listed and agreed with the findings in the report. No results found.    No orders of the defined types were placed in this encounter.  All questions were answered. The patient knows to call the clinic with any problems, questions or concerns. No barriers to learning was detected. The total time spent in the appointment was {CHL ONC TIME VISIT - [id number]}.     Verlee Rossetti, CMA 11/06/2022   I, Sharlette Dense, CMA, am acting as scribe for Malachy Mood, MD.   {Add scribe attestation statement}

## 2022-11-06 NOTE — Progress Notes (Signed)
[facility]   Telephone:(336) [phone] Fax:(336) [phone]   Clinic Follow up Note    Patient Care Team: Loura Back, NP as PCP - General (Nurse Practitioner) Griselda Miner, MD as Consulting Physician (General Surgery) Andria Meuse, MD as Consulting Physician (General Surgery) Dorothy Puffer, MD as Consulting Physician (Radiation Oncology) Malachy Mood, MD as Consulting Physician (Hematology) Barrie Folk, RN as Oncology Nurse Navigator Pollyann Samples, NP as Nurse Practitioner (Nurse Practitioner)   Date of Service:  11/06/2022   CHIEF COMPLAINT: f/u of rectal cancer    CURRENT THERAPY:  Surveillance    ASSESSMENT:  Leah Bailey is a 67 y.o. 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 Staging CT showed low rectal wall thickening with abscess and surrounding lymphadenopathy, largest up to 13mm. PET shows no distant metastasis.  -She completed concurrent chemoRT with Mitomycin and 5FU 10/5-11/13/20. PET from 07/2019 shows near complete response to treatment. She is currently on Surveillance.  -She is clinically doing well, lab reviewed, exam was unremarkable, including rectal exam, no concern for recurrence. -She is 4 years out from initial diagnosis, I do not plan to repeat a more surveillance CT scan.  Will follow-up in one year for last visit   Mild anemia -Probably related to previous chemotherapy -Will mild and stable, she has started over-the-counter iron pill daily. Will continue    PLAN: - reviewed labs -continue OTC iron to daily vitamins - f/u in one year with Lacie     SUMMARY OF ONCOLOGIC HISTORY:     Oncology History Overview Note   Cancer Staging Squamous cell carcinoma of rectum [facility]) Staging form: Colon and Rectum, AJCC 8th Edition - Clinical stage from 02/04/2019: Stage IIIA (cT2, cN1a, cM0) - Signed by Malachy Mood, MD on 02/04/2019        Malignant neoplasm of anus (HCC)   01/13/2019  Initial Diagnosis     Squamous cell carcinoma of rectum (HCC)     01/14/2019 Imaging     CT Pelvis W Contrast 01/14/19 IMPRESSION: Perirectal abscess, at the left posterior aspect of the rectum, inseparable from the wall of the colon and the adjacent pelvic musculature. Largest diameter on cranial caudal imaging measures 4.8 cm.   Asymmetric thickening of the rectum associated with the abscess. A perforated rectal carcinoma is favored given the appearance, and correlation with physical exam as well anoscopy/rigid sigmoidoscopy. Alternatively, the changes may reflect chronic inflammation in the setting of inflammatory bowel disease.   There are several small lymph nodes within the perirectal fat and along the left pelvis, the largest measuring 13 mm, potentially pathologic in the setting of carcinoma, or alternatively reactive.     01/15/2019 Initial Diagnosis     Diagnosis 01/15/19 Soft tissue, abscess, rectal wall - INVASIVE SQUAMOUS CELL CARCINOMA, MODERATELY TO POORLY DIFFERENTIATED.     02/03/2019 Imaging     CT Chest W Contrast 02/03/19  IMPRESSION: 1. Two nodules each measuring about 4 mm in long axis noted in the right upper lobe. One of these is subpleural. These are likely benign but given the context, surveillance imaging in 6-12 months time should be considered. Fleischner criteria for follow do not directly apply given the history of malignancy.     02/04/2019 Cancer Staging     Staging form: Colon and Rectum, AJCC 8th Edition - Clinical stage from 02/04/2019: Stage IIIA (cT2, cN1a, cM0) - Signed by Malachy Mood, MD on 02/04/2019  02/15/2019 PET scan     IMPRESSION: 1. Hypermetabolic eccentric rectal wall thickening with hypermetabolic left perirectal adenopathy. No evidence of distant metastatic disease. 2. Possible gallbladder sludge. 3. Left renal stone.     02/22/2019 - 03/22/2019 Chemotherapy     ChemoRT with Mitomycin/5FU week 1 and 5 starting 02/22/19 and ending  03/22/19     02/22/2019 - 04/02/2019 Radiation Therapy     ChemoRT with Dr. Roselind Messier 02/22/19-04/02/19     07/19/2019 PET scan     IMPRESSION: 1. Interval marked improvement in the previously demonstrated hypermetabolic anorectal wall thickening and perirectal adenopathy. Mild residual activity at the anal verge is nonspecific and within physiologic limits. 2. No evidence of metastatic disease. 3. No acute findings.     09/20/2019 Survivorship     SCP delivered via virtual appointment by Santiago Glad, NP      05/16/2020 Imaging     CT CAP  IMPRESSION: 1. No evidence of metastatic disease in the chest, abdomen or pelvis. 2. Mild circumferential wall thickening in the lower rectum is slightly decreased from 07/19/2019 PET-CT, with no discrete residual rectal mass, favoring post treatment change. 3. Trace free fluid in the deep pelvis, nonspecific. 4. Mild left colonic diverticulosis.     05/08/2021 Imaging     EXAM: CT CHEST, ABDOMEN, AND PELVIS WITH CONTRAST   IMPRESSION: No evidence of recurrent or metastatic carcinoma. No other acute findings.   Colonic diverticulosis. No radiographic evidence of diverticulitis.   Aortic Atherosclerosis (ICD10-I70.0).           INTERVAL HISTORY:  Leah Bailey is here for a follow up of  rectal cancer She was last seen by me on 05/09/22 She presents to the clinic alone.  Patient reports doing well overall. No pain reported today. Its been 4 years since cancer diagnosis. Low risk of reoccurrence.      All other systems were reviewed with the patient and are negative.   MEDICAL HISTORY:      Past Medical History:  Diagnosis Date   Herpes simplex disciform keratitis 10/20/2013   Hypertension     IBD (inflammatory bowel disease) - colitis 01/11/2018   Nuclear sclerosis of both eyes 11/30/2014   Squamous cell carcinoma of rectum (HCC) 01/13/2019   UC (ulcerative colitis) (HCC)        SURGICAL HISTORY:      Past Surgical History:   Procedure Laterality Date   ABDOMINAL HYSTERECTOMY       BREAST CYST ASPIRATION Left 1983   BREAST LUMPECTOMY Left     COLONOSCOPY       INCISION AND DRAINAGE PERIRECTAL ABSCESS N/A 01/15/2019    Procedure: INTERNAL DRAINAGE OF PERIRECTAL ABSCESS;  Surgeon: Griselda Miner, MD;  Location: [facility];  Service: General;  Laterality: N/A;   TUBAL LIGATION          I have reviewed the social history and family history with the patient and they are unchanged from previous note.   ALLERGIES:  has No Known Allergies.   MEDICATIONS:        Current Outpatient Medications  Medication Sig Dispense Refill   aspirin 81 MG tablet Take 81 mg by mouth daily.       lisinopril-hydrochlorothiazide (PRINZIDE,ZESTORETIC) 20-25 MG tablet Take 1 tablet by mouth daily. 90 tablet 3   Multiple Vitamins-Calcium (ONE-A-DAY WOMENS PO) Take 1 tablet by mouth daily.        No current facility-administered medications for this visit.      PHYSICAL  EXAMINATION: ECOG PERFORMANCE STATUS: 0 - Asymptomatic BP 117/70 (BP Location: Right Arm, Patient Position: Sitting)   Pulse 62   Temp 98.3 F (36.8 C) (Oral)   Resp 14   Ht 5\' 5"  (1.651 m)   Wt 171 lb 14.4 oz (78 kg)   SpO2 99%   BMI 28.61 kg/m   GENERAL:alert, no distress and comfortable SKIN: skin color, texture, turgor are normal, no rashes or significant lesions EYES: normal, Conjunctiva are pink and non-injected, sclera clear NECK: supple, thyroid normal size, non-tender, without nodularity LYMPH:  no palpable lymphadenopathy in the cervical, axillary or inguinal LUNGS: clear to auscultation and percussion with normal breathing effort HEART: regular rate & rhythm and no murmurs and no lower extremity edema ABDOMEN:abdomen soft, non-tender and normal bowel sounds, rectal exam was unremarkable Musculoskeletal:no cyanosis of digits and no clubbing  NEURO: alert & oriented x 3 with fluent speech, no focal motor/sensory deficits   LABORATORY DATA:      Latest Ref Rng & Units 11/06/2022    7:59 AM 05/07/2022    3:26 PM 11/12/2021    8:47 AM  CBC  WBC 4.0 - 10.5 K/uL 3.3  4.1  2.5   Hemoglobin 12.0 - 15.0 g/dL 40.9  81.1  91.4   Hematocrit 36.0 - 46.0 % 32.3  34.6  32.9   Platelets 150 - 400 K/uL 275  319  311       Latest Ref Rng & Units 11/06/2022    7:59 AM 05/07/2022    3:26 PM 11/12/2021    8:47 AM  CMP  Glucose 70 - 99 mg/dL 782  956  213   BUN 8 - 23 mg/dL 17  16  15    Creatinine 0.44 - 1.00 mg/dL 0.86  5.78  4.69   Sodium 135 - 145 mmol/L 138  137  137   Potassium 3.5 - 5.1 mmol/L 4.6  4.0  4.1   Chloride 98 - 111 mmol/L 107  100  105   CO2 22 - 32 mmol/L 26  28  26    Calcium 8.9 - 10.3 mg/dL 9.3  9.1  9.3   Total Protein 6.5 - 8.1 g/dL 7.0  8.1  7.3   Total Bilirubin 0.3 - 1.2 mg/dL 0.3  <6.2  0.3   Alkaline Phos 38 - 126 U/L 83  74  74   AST 15 - 41 U/L 18  20  21    ALT 0 - 44 U/L 26  25  34       RADIOGRAPHIC STUDIES: I have personally reviewed the radiological images as listed and agreed with the findings in the report.  Imaging Results (Last 48 hours)  CT CHEST ABDOMEN PELVIS W CONTRAST   Result Date: 05/09/2022 CLINICAL DATA:  Anorectal cancer restaging * Tracking Code: BO * EXAM: CT CHEST, ABDOMEN, AND PELVIS WITH CONTRAST TECHNIQUE: Multidetector CT imaging of the chest, abdomen and pelvis was performed following the standard protocol during bolus administration of intravenous contrast. RADIATION DOSE REDUCTION: This exam was performed according to the departmental dose-optimization program which includes automated exposure control, adjustment of the mA and/or kV according to patient size and/or use of iterative reconstruction technique. CONTRAST:  OMNIPAQUE IOHEXOL 300 MG/ML SOLN additional oral enteric contrast COMPARISON:  05/08/2021 FINDINGS: CT CHEST FINDINGS Cardiovascular: No significant vascular findings. Normal heart size. No pericardial effusion. Mediastinum/Nodes: No enlarged mediastinal, hilar, or  axillary lymph nodes. Thymic remnant in the anterior mediastinum. Thyroid gland, trachea, and esophagus demonstrate no  significant findings. Lungs/Pleura: Unchanged, benign 0.3 cm nodule of the right pulmonary apex (series 6, image 26). No pleural effusion or pneumothorax. Musculoskeletal: No chest wall abnormality. No acute osseous findings. CT ABDOMEN PELVIS FINDINGS Hepatobiliary: No solid liver abnormality is seen. Contracted gallbladder. No gallstones, gallbladder wall thickening, or biliary dilatation. Pancreas: Unremarkable. No pancreatic ductal dilatation or surrounding inflammatory changes. Spleen: Normal in size without significant abnormality. Adrenals/Urinary Tract: Adrenal glands are unremarkable. Kidneys are normal, without renal calculi, solid lesion, or hydronephrosis. Bladder is unremarkable. Stomach/Bowel: Stomach is within normal limits. Appendix appears normal. Occasional descending diverticula. Unchanged appearance of the low rectum and anus, without obvious mass (series 2, image 114). Vascular/Lymphatic: No significant vascular findings are present. No enlarged abdominal or pelvic lymph nodes. Reproductive: Status post hysterectomy. Other: No abdominal wall hernia or abnormality. No ascites. Musculoskeletal: No acute osseous findings. IMPRESSION: 1. Unchanged appearance of the low rectum and anus, without obvious mass at this time. 2. No evidence of lymphadenopathy or metastatic disease in the chest, abdomen, or pelvis. Electronically Signed   By: Jearld Lesch M.D.   On: 05/09/2022 08:52          No orders of the defined types were placed in this encounter.   All questions were answered. The patient knows to call the clinic with any problems, questions or concerns. No barriers to learning was detected. The total time spent in the appointment was 30 minutes.      Malachy Mood, MD 11/06/2022    I, Sharlette Dense, CMA, am acting as scribe for Malachy Mood, MD.    I have reviewed the above  documentation for accuracy and completeness, and I agree with the above.

## 2023-01-31 ENCOUNTER — Other Ambulatory Visit: Payer: Self-pay | Admitting: Registered Nurse

## 2023-01-31 DIAGNOSIS — Z1231 Encounter for screening mammogram for malignant neoplasm of breast: Secondary | ICD-10-CM

## 2023-02-25 ENCOUNTER — Ambulatory Visit
Admission: RE | Admit: 2023-02-25 | Discharge: 2023-02-25 | Disposition: A | Discharge status: Home / Self Care | Payer: Medicare HMO | Source: Ambulatory Visit | Attending: Registered Nurse | Admitting: Registered Nurse

## 2023-02-25 DIAGNOSIS — Z1231 Encounter for screening mammogram for malignant neoplasm of breast: Secondary | ICD-10-CM

## 2023-10-26 NOTE — Progress Notes (Unsigned)
 [facility]   Telephone:(336) [phone] Fax:(336) [phone]    Patient Care Team: Leah Lose, NP as PCP - General (Nurse Practitioner) Leah Chandler, MD as Consulting Physician (General Surgery) Leah Stabs, MD as Consulting Physician (General Surgery) Leah Myers, MD as Consulting Physician (Radiation Oncology) Leah Prescott, MD as Consulting Physician (Hematology) Leah Bailey, Dawn, RN (Inactive) as Oncology Nurse Navigator Leah Doom K, NP as Nurse Practitioner (Nurse Practitioner)   CHIEF COMPLAINT: Follow up anal cancer   Oncology History Overview Note  Cancer Staging Squamous cell carcinoma of rectum [facility]) Staging form: Colon and Rectum, AJCC 8th Edition - Clinical stage from 02/04/2019: Stage IIIA (cT2, cN1a, cM0) - Signed by Leah [place]town, MD on 02/04/2019     Malignant neoplasm of anus (HCC)  01/13/2019 Initial Diagnosis   Squamous cell carcinoma of rectum (HCC)   01/14/2019 Imaging   CT Pelvis W Contrast 01/14/19 IMPRESSION: Perirectal abscess, at the left posterior aspect of the rectum, inseparable from the wall of the colon and the adjacent pelvic musculature. Largest diameter on cranial caudal imaging measures 4.8 cm.   Asymmetric thickening of the rectum associated with the abscess. A perforated rectal carcinoma is favored given the appearance, and correlation with physical exam as well anoscopy/rigid sigmoidoscopy. Alternatively, the changes may reflect chronic inflammation in the setting of inflammatory bowel disease.   There are several small lymph nodes within the perirectal fat and along the left pelvis, the largest measuring 13 mm, potentially pathologic in the setting of carcinoma, or alternatively reactive.   01/15/2019 Initial Diagnosis   Diagnosis 01/15/19 Soft tissue, abscess, rectal wall - INVASIVE SQUAMOUS CELL CARCINOMA, MODERATELY TO POORLY DIFFERENTIATED.   02/03/2019 Imaging   CT Chest W Contrast 02/03/19  IMPRESSION: 1.  Two nodules each measuring about 4 mm in long axis noted in the right upper lobe. One of these is subpleural. These are likely benign but given the context, surveillance imaging in 6-12 months time should be considered. Fleischner criteria for follow do not directly apply given the history of malignancy.   02/04/2019 Cancer Staging   Staging form: Colon and Rectum, AJCC 8th Edition - Clinical stage from 02/04/2019: Stage IIIA (cT2, cN1a, cM0) - Signed by Leah , MD on 02/04/2019   02/15/2019 PET scan   IMPRESSION: 1. Hypermetabolic eccentric rectal wall thickening with hypermetabolic left perirectal adenopathy. No evidence of distant metastatic disease. 2. Possible gallbladder sludge. 3. Left renal stone.   02/22/2019 - 03/22/2019 Chemotherapy   ChemoRT with Mitomycin /5FU week 1 and 5 starting 02/22/19 and ending 03/22/19   02/22/2019 - 04/02/2019 Radiation Therapy   ChemoRT with Leah Bailey 02/22/19-04/02/19   07/19/2019 PET scan   IMPRESSION: 1. Interval marked improvement in the previously demonstrated hypermetabolic anorectal wall thickening and perirectal adenopathy. Mild residual activity at the anal verge is nonspecific and within physiologic limits. 2. No evidence of metastatic disease. 3. No acute findings.   09/20/2019 Survivorship   SCP delivered via virtual appointment by Leah Leisinger, NP    05/16/2020 Imaging   CT CAP  IMPRESSION: 1. No evidence of metastatic disease in the chest, abdomen or pelvis. 2. Mild circumferential wall thickening in the lower rectum is slightly decreased from 07/19/2019 PET-CT, with no discrete residual rectal mass, favoring post treatment change. 3. Trace free fluid in the deep pelvis, nonspecific. 4. Mild left colonic diverticulosis.   05/08/2021 Imaging   EXAM: CT CHEST, ABDOMEN, AND PELVIS WITH CONTRAST  IMPRESSION: No evidence of recurrent or  metastatic carcinoma. No other acute findings.   Colonic diverticulosis. No radiographic  evidence of diverticulitis.   Aortic Atherosclerosis (ICD10-I70.0).      CURRENT THERAPY: Surveillance   INTERVAL HISTORY Leah Bailey returns for follow up as scheduled. Last seen 11/06/22, doing well in the interval with no specific health changes.  She remains active with good energy and appetite.  Denies unintentional weight loss, rectal pain or bleeding, change in bowel habits, or abdominal pain/bloating.  Pain she used to experience with intercourse has improved.  She has not seen GI since initial diagnosis.  ROS  All other systems reviewed and negative  Past Medical History:  Diagnosis Date   Herpes simplex disciform keratitis 10/20/2013   Hypertension    IBD (inflammatory bowel disease) - colitis 01/11/2018   Nuclear sclerosis of both eyes 11/30/2014   Squamous cell carcinoma of rectum (HCC) 01/13/2019   UC (ulcerative colitis) (HCC)      Past Surgical History:  Procedure Laterality Date   ABDOMINAL HYSTERECTOMY     BREAST CYST ASPIRATION Left 1983   BREAST LUMPECTOMY Left    COLONOSCOPY     INCISION AND DRAINAGE PERIRECTAL ABSCESS N/A 01/15/2019   Procedure: INTERNAL DRAINAGE OF PERIRECTAL ABSCESS;  Surgeon: Leah Chandler, MD;  Location: [facility];  Service: General;  Laterality: N/A;   TUBAL LIGATION       Outpatient Encounter Medications as of 10/28/2023  Medication Sig   aspirin 81 MG tablet Take 81 mg by mouth daily.   lisinopril -hydrochlorothiazide  (PRINZIDE ,ZESTORETIC ) 20-25 MG tablet Take 1 tablet by mouth daily.   Multiple Vitamins-Calcium (ONE-A-DAY WOMENS PO) Take 1 tablet by mouth daily.   No facility-administered encounter medications on file as of 10/28/2023.     Today's Vitals   10/28/23 0916 10/28/23 0920  BP: 128/72   Pulse: 72   Resp: 16   Temp: 98.3 F (36.8 C)   TempSrc: Temporal   SpO2: 98%   Weight: 176 lb 8 oz (80.1 kg)   PainSc:  0-No pain   Body mass index is 29.37 kg/m.   ECOG PERFORMANCE STATUS: 0 - Asymptomatic  PHYSICAL  EXAM GENERAL:alert, no distress and comfortable SKIN: no rash  EYES: sclera clear NECK: without mass LYMPH:  no palpable cervical or supraclavicular lymphadenopathy  LUNGS: clear with normal breathing effort HEART: regular rate & rhythm, no lower extremity edema ABDOMEN: abdomen soft, non-tender and normal bowel sounds Rectal exam: No external or internal masses, normal sphincter tone.  No blood on glove NEURO: alert & oriented x 3 with fluent speech, no focal motor/sensory deficits Exam performed with Geralynn Knife, CMA as chaperone  CBC    Latest Ref Rng & Units 10/28/2023    8:49 AM 11/06/2022    7:59 AM 05/07/2022    3:26 PM  CBC  WBC 4.0 - 10.5 Bailey/uL 3.5  3.3  4.1   Hemoglobin 12.0 - 15.0 g/dL 96.0  45.4  09.8   Hematocrit 36.0 - 46.0 % 32.8  32.3  34.6   Platelets 150 - 400 Bailey/uL 306  275  319       CMP     Latest Ref Rng & Units 10/28/2023    8:49 AM 11/06/2022    7:59 AM 05/07/2022    3:26 PM  CMP  Glucose 70 - 99 mg/dL 119  147  829   BUN 8 - 23 mg/dL 17  17  16    Creatinine 0.44 - 1.00 mg/dL 5.62  1.30  8.65  Sodium 135 - 145 mmol/L 139  138  137   Potassium 3.5 - 5.1 mmol/L 4.3  4.6  4.0   Chloride 98 - 111 mmol/L 106  107  100   CO2 22 - 32 mmol/L 26  26  28    Calcium 8.9 - 10.3 mg/dL 9.4  9.3  9.1   Total Protein 6.5 - 8.1 g/dL 7.5  7.0  8.1   Total Bilirubin 0.0 - 1.2 mg/dL 0.3  0.3  <1.6   Alkaline Phos 38 - 126 U/L 73  83  74   AST 15 - 41 U/L 19  18  20    ALT 0 - 44 U/L 24  26  25       ASSESSMENT/PLAN: Leah Bailey is a 68 y.o. African American female with a history of HTN, IBD, ulcerative colitis    1. Squamous cell carcinoma of rectum, cT2N1aM0 stage IIIA  -diagnosed 12/2018. Her CT scans shows low rectal wall thickening with abcess and surrounding lymphadenopathy, largest up to 13mm. Chest scan also shows 2 tiny 4mm right lung nodules which are likely benign. PET scan showed no distant metastasis.  -She completed chemoRT with 5FU/mitomycin  and  radiation from 10/5 to 04/02/19 -PET 07/2019 shows near complete response to treatment and went on surveillance - Ms. Helman is clinically doing well, exam is benign, labs are stable, overall no clinical concern for recurrence. -She has completed 5 years cancer surveillance in our clinic, can be discharged back to PCP and GI, I will send Dr. Willy Harvest a message. -F/up open    2. Ulcerative Colitis/IBD, Diverticulosis   -Found incidentally on 12/2017 screening colonoscopy  -Dr. Willy Harvest     PLAN: -Labs reviewed -Pt completed 5 years cancer surveillance, discharge back to PCP and GI -Continue multivitamin -F/up open   All questions were answered. The patient knows to call the clinic with any problems, questions or concerns. No barriers to learning were detected.   Royce Stegman Bailey Lucia Harm, NP 10/28/2023

## 2023-10-28 ENCOUNTER — Inpatient Hospital Stay: Payer: Medicare HMO

## 2023-10-28 ENCOUNTER — Inpatient Hospital Stay: Payer: Medicare HMO | Attending: Nurse Practitioner | Admitting: Nurse Practitioner

## 2023-10-28 ENCOUNTER — Encounter: Payer: Self-pay | Admitting: Nurse Practitioner

## 2023-10-28 VITALS — BP 128/72 | HR 72 | Temp 98.3°F | Resp 16 | Wt 176.5 lb

## 2023-10-28 DIAGNOSIS — Z08 Encounter for follow-up examination after completed treatment for malignant neoplasm: Secondary | ICD-10-CM | POA: Diagnosis present

## 2023-10-28 DIAGNOSIS — C210 Malignant neoplasm of anus, unspecified: Secondary | ICD-10-CM | POA: Diagnosis not present

## 2023-10-28 DIAGNOSIS — K5190 Ulcerative colitis, unspecified, without complications: Secondary | ICD-10-CM | POA: Insufficient documentation

## 2023-10-28 DIAGNOSIS — K5730 Diverticulosis of large intestine without perforation or abscess without bleeding: Secondary | ICD-10-CM | POA: Insufficient documentation

## 2023-10-28 DIAGNOSIS — Z85048 Personal history of other malignant neoplasm of rectum, rectosigmoid junction, and anus: Secondary | ICD-10-CM | POA: Insufficient documentation

## 2023-10-28 DIAGNOSIS — C20 Malignant neoplasm of rectum: Secondary | ICD-10-CM

## 2023-10-28 LAB — CBC WITH DIFFERENTIAL (CANCER CENTER ONLY)
Abs Immature Granulocytes: 0.01 10*3/uL (ref 0.00–0.07)
Basophils Absolute: 0 10*3/uL (ref 0.0–0.1)
Basophils Relative: 1 %
Eosinophils Absolute: 0.1 10*3/uL (ref 0.0–0.5)
Eosinophils Relative: 2 %
HCT: 32.8 % — ABNORMAL LOW (ref 36.0–46.0)
Hemoglobin: 10.8 g/dL — ABNORMAL LOW (ref 12.0–15.0)
Immature Granulocytes: 0 %
Lymphocytes Relative: 37 %
Lymphs Abs: 1.3 10*3/uL (ref 0.7–4.0)
MCH: 28.3 pg (ref 26.0–34.0)
MCHC: 32.9 g/dL (ref 30.0–36.0)
MCV: 86.1 fL (ref 80.0–100.0)
Monocytes Absolute: 0.4 10*3/uL (ref 0.1–1.0)
Monocytes Relative: 12 %
Neutro Abs: 1.7 10*3/uL (ref 1.7–7.7)
Neutrophils Relative %: 48 %
Platelet Count: 306 10*3/uL (ref 150–400)
RBC: 3.81 MIL/uL — ABNORMAL LOW (ref 3.87–5.11)
RDW: 13.2 % (ref 11.5–15.5)
WBC Count: 3.5 10*3/uL — ABNORMAL LOW (ref 4.0–10.5)
nRBC: 0 % (ref 0.0–0.2)

## 2023-10-28 LAB — CMP (CANCER CENTER ONLY)
ALT: 24 U/L (ref 0–44)
AST: 19 U/L (ref 15–41)
Albumin: 4.4 g/dL (ref 3.5–5.0)
Alkaline Phosphatase: 73 U/L (ref 38–126)
Anion gap: 7 (ref 5–15)
BUN: 17 mg/dL (ref 8–23)
CO2: 26 mmol/L (ref 22–32)
Calcium: 9.4 mg/dL (ref 8.9–10.3)
Chloride: 106 mmol/L (ref 98–111)
Creatinine: 0.72 mg/dL (ref 0.44–1.00)
GFR, Estimated: >60 mL/min (ref >60)
Glucose, Bld: 116 mg/dL — ABNORMAL HIGH (ref 70–99)
Potassium: 4.3 mmol/L (ref 3.5–5.1)
Sodium: 139 mmol/L (ref 135–145)
Total Bilirubin: 0.3 mg/dL (ref 0.0–1.2)
Total Protein: 7.5 g/dL (ref 6.5–8.1)

## 2023-11-03 NOTE — Progress Notes (Signed)
 I have left her a detailed message to call back and set up a pre-visit and colonoscopy appointment with Dr Loy Ruff.

## 2023-11-06 ENCOUNTER — Other Ambulatory Visit: Payer: Medicare HMO

## 2023-11-06 ENCOUNTER — Ambulatory Visit: Payer: Medicare HMO | Admitting: Nurse Practitioner

## 2023-11-28 NOTE — Progress Notes (Signed)
 I have left her another detailed message to call us  to set up her colonoscopy.

## 2023-11-28 NOTE — Progress Notes (Signed)
 Left another detailed message to please call us  to set up her colonoscopy.

## 2023-12-05 ENCOUNTER — Encounter: Payer: Self-pay | Admitting: Internal Medicine

## 2023-12-05 NOTE — Progress Notes (Signed)
 Patient scheduled for colonoscopy 01/15/24

## 2023-12-23 ENCOUNTER — Encounter: Payer: Self-pay | Admitting: Internal Medicine

## 2023-12-23 ENCOUNTER — Ambulatory Visit (AMBULATORY_SURGERY_CENTER)

## 2023-12-23 VITALS — Ht 65.0 in | Wt 174.0 lb

## 2023-12-23 DIAGNOSIS — C210 Malignant neoplasm of anus, unspecified: Secondary | ICD-10-CM

## 2023-12-23 MED ORDER — NA SULFATE-K SULFATE-MG SULF 17.5-3.13-1.6 GM/177ML PO SOLN
1.0000 | Freq: Once | ORAL | 0 refills | Status: AC
Start: 2023-12-23 — End: 2023-12-23

## 2023-12-23 NOTE — Progress Notes (Signed)
 No egg or soy allergy known to patient  No issues known to pt with past sedation with any surgeries or procedures Patient denies ever being told they had issues or difficulty with intubation  No FH of Malignant Hyperthermia Pt is not on diet pills nor GLP-1 medications Pt is not on home 02  Pt is not on blood thinners  Pt denies issues with chronic constipation  No A fib or A flutter Have any cardiac testing pending--no Pt instructed to use Singlecare[employer]com or GoodRx for a price reduction on prep  Ambulates independently Instructions printed and mailed to patient per her request.

## 2024-01-12 ENCOUNTER — Other Ambulatory Visit: Payer: Self-pay | Admitting: Registered Nurse

## 2024-01-12 DIAGNOSIS — Z1231 Encounter for screening mammogram for malignant neoplasm of breast: Secondary | ICD-10-CM

## 2024-01-14 NOTE — Progress Notes (Unsigned)
 [facility] History and Physical   Primary Care Physician:  Leontine Cramp, NP   Reason for Procedure:    Encounter Diagnoses  Name Primary?   Anal cancer (HCC) Yes   Mucosal abnormality of colon      Plan:    Colonoscopy     HPI: Leah Bailey is a 68 y.o. female with a history of anal cancer diagnosed in 2020 and is status posttreatment and no evidence of disease after 5 years.  She had a colonoscopy in 2019 which revealed ulcerations throughout the colon and biopsies were taken and pathology suggested chronic colitis so there was a question of IBD though she was asymptomatic.  She is here for a follow-up colonoscopy exam for these issues.   Past Medical History:  Diagnosis Date   Herpes simplex disciform keratitis 10/20/2013   Hypertension    IBD (inflammatory bowel disease) - colitis 01/11/2018   Nuclear sclerosis of both eyes 11/30/2014   Squamous cell carcinoma of rectum (HCC) 01/13/2019   UC (ulcerative colitis) (HCC)     Past Surgical History:  Procedure Laterality Date   ABDOMINAL HYSTERECTOMY     BREAST CYST ASPIRATION Left 1983   BREAST LUMPECTOMY Left    COLONOSCOPY     INCISION AND DRAINAGE PERIRECTAL ABSCESS N/A 01/15/2019   Procedure: INTERNAL DRAINAGE OF PERIRECTAL ABSCESS;  Surgeon: Curvin Deward MOULD, MD;  Location: [facility];  Service: General;  Laterality: N/A;   TUBAL LIGATION       Current Outpatient Medications  Medication Sig Dispense Refill   aspirin 81 MG tablet Take 81 mg by mouth daily.     ferrous sulfate 325 (65 FE) MG EC tablet Take 325 mg by mouth 3 (three) times daily with meals.     lisinopril -hydrochlorothiazide  (PRINZIDE ,ZESTORETIC ) 20-25 MG tablet Take 1 tablet by mouth daily. 90 tablet 3   Multiple Vitamins-Calcium (ONE-A-DAY WOMENS PO) Take 1 tablet by mouth daily.     No current facility-administered medications for this visit.    Allergies as of 01/15/2024   (No Known Allergies)    Family History  Problem Relation  Age of Onset   Diabetes Mother    Breast cancer Mother 63   Heart attack Father    Heart disease Father    Diabetes Sister    Diabetes Brother    Diabetes Sister    Healthy Son    Healthy Son    Diabetes Brother    Breast cancer Cousin    Colon cancer Neg Hx    Esophageal cancer Neg Hx    Stomach cancer Neg Hx    Rectal cancer Neg Hx     Social History   Socioeconomic History   Marital status: Married    Spouse name: Not on file   Number of children: 2   Years of education: Not on file   Highest education level: 12th grade  Occupational History   Occupation: CNA    Comment: [employer]  Tobacco Use   Smoking status: Never   Smokeless tobacco: Never  Vaping Use   Vaping status: Never Used  Substance and Sexual Activity   Alcohol use: No   Drug use: No   Sexual activity: Yes    Partners: Male    Birth control/protection: Post-menopausal  Other Topics Concern   Not on file  Social History Narrative   Lives with her husband.  She is a Lawyer at [employer]   Their adult sons live nearby.  2 sons   No alcohol  tobacco or drug use   Social Drivers of Corporate investment banker Strain: Low Risk  (11/14/2020)   Received from [facility]   Overall Financial Resource Strain (CARDIA)    Difficulty of Paying Living Expenses: Not hard at all  Food Insecurity: No Food Insecurity (11/14/2020)   Received [facility]   Hunger Vital Sign    Within the past 12 months, you worried that your food would run out before you got the money to buy more.: Never true    Within the past 12 months, the food you bought just didn't last and you didn't have money to get more.: Never true  Transportation Needs: No Transportation Needs (11/14/2020)   Received from [facility] - Transportation    Lack of Transportation (Medical): No    Lack of Transportation (Non-Medical): No  Physical Activity: Insufficiently Active (11/14/2020)   Received from [facility]   Exercise Vital  Sign    On average, how many days per week do you engage in moderate to strenuous exercise (like a brisk walk)?: 2 days    On average, how many minutes do you engage in exercise at this level?: 10 min  Stress: No Stress Concern Present (11/14/2020)   Received from [facility] - Occupational Stress Questionnaire    Feeling of Stress : Not at all  Social Connections: Unknown (10/02/2021)   Received from [facility]   Social Network    Social Network: Not on file  Intimate Partner Violence: Unknown (08/23/2021)   Received from [facility]   HITS    Physically Hurt: Not on file    Insult or Talk Down To: Not on file    Threaten Physical Harm: Not on file    Scream or Curse: Not on file    Review of Systems: Positive for *** All other review of systems negative except as mentioned in the HPI.  Physical Exam: Vital signs There were no vitals taken for this visit.  General:   Alert,  Well-developed, well-nourished, pleasant and cooperative in NAD Lungs:  Clear throughout to auscultation.   Heart:  Regular rate and rhythm; no murmurs, clicks, rubs,  or gallops. Abdomen:  Soft, nontender and nondistended. Normal bowel sounds.   Neuro/Psych:  Alert and cooperative. Normal mood and affect. A and O x 3   @Dari Carpenito  [name], MD, [facility] [phone] (pager) 01/14/2024 9:13 PM@

## 2024-01-15 ENCOUNTER — Ambulatory Visit: Admitting: Internal Medicine

## 2024-01-15 ENCOUNTER — Encounter: Payer: Self-pay | Admitting: Internal Medicine

## 2024-01-15 VITALS — BP 121/98 | HR 72 | Temp 97.2°F | Resp 16 | Ht 65.0 in | Wt 174.0 lb

## 2024-01-15 DIAGNOSIS — K6389 Other specified diseases of intestine: Secondary | ICD-10-CM

## 2024-01-15 DIAGNOSIS — K639 Disease of intestine, unspecified: Secondary | ICD-10-CM

## 2024-01-15 DIAGNOSIS — C210 Malignant neoplasm of anus, unspecified: Secondary | ICD-10-CM

## 2024-01-15 DIAGNOSIS — Z85048 Personal history of other malignant neoplasm of rectum, rectosigmoid junction, and anus: Secondary | ICD-10-CM | POA: Diagnosis not present

## 2024-01-15 DIAGNOSIS — Z8719 Personal history of other diseases of the digestive system: Secondary | ICD-10-CM

## 2024-01-15 MED ORDER — SODIUM CHLORIDE 0.9 % IV SOLN: 500.0000 mL | Freq: Once | INTRAVENOUS | Status: AC

## 2024-01-15 NOTE — Progress Notes (Signed)
 Pt's states no medical or surgical changes since previsit or office visit.

## 2024-01-15 NOTE — Progress Notes (Signed)
 Called to room to assist during endoscopic procedure.  Patient ID and intended procedure confirmed with present staff. Received instructions for my participation in the procedure from the performing physician.

## 2024-01-15 NOTE — Op Note (Signed)
 [facility] Patient Name: Leah Bailey Procedure Date: 01/15/2024 10:26 AM MRN: [id number] Endoscopist: Lupita FORBES Commander , MD, [id number] Age: 68 Referring MD:  Date of Birth: 07-Jul-1955 Gender: Female Account #: [id number] Procedure:                Colonoscopy Indications:              Follow-up of colitis 2019 had patchy ulcerations                            suggesting chronic colitis (no symptoms or signs) -                            also has history of anal cancer 2020 s/p chemo/XRT Medicines:                Monitored Anesthesia Care Procedure:                Pre-Anesthesia Assessment:                           - Prior to the procedure, a History and Physical                            was performed, and patient medications and                            allergies were reviewed. The patient's tolerance of                            previous anesthesia was also reviewed. The risks                            and benefits of the procedure and the sedation                            options and risks were discussed with the patient.                            All questions were answered, and informed consent                            was obtained. Prior Anticoagulants: The patient has                            taken no anticoagulant or antiplatelet agents. ASA                            Grade Assessment: II - A patient with mild systemic                            disease. After reviewing the risks and benefits,                            the patient was deemed in satisfactory condition to  undergo the procedure.                           After obtaining informed consent, the colonoscope                            was passed under direct vision. Throughout the                            procedure, the patient's blood pressure, pulse, and                            oxygen saturations were monitored continuously. The                             Olympus Scope SN: [id number] was introduced through                            the anus and advanced to the the terminal ileum,                            with identification of the appendiceal orifice and                            IC valve. The colonoscopy was performed without                            difficulty. The patient tolerated the procedure                            well. The quality of the bowel preparation was                            good. The terminal ileum, ileocecal valve,                            appendiceal orifice, and rectum were photographed.                            The bowel preparation used was SUPREP via split                            dose instruction. Scope In: 11:01:29 AM Scope Out: 11:13:39 AM Scope Withdrawal Time: 0 hours 9 minutes 23 seconds  Total Procedure Duration: 0 hours 12 minutes 10 seconds  Findings:                 The perianal and digital rectal examinations were                            normal.                           A diffuse area of mucosa in the terminal ileum was  nodular. Biopsies were taken with a cold forceps                            for histology. Verification of patient                            identification for the specimen was done. Estimated                            blood loss was minimal.                           The colon (entire examined portion) appeared                            normal. Biopsies for histology were taken with a                            cold forceps from the ascending colon, transverse                            colon, descending colon and sigmoid colon for                            evaluation of microscopic colitis. Verification of                            patient identification for the specimen was done.                            Estimated blood loss was minimal.                           The exam was otherwise without abnormality on                             direct and retroflexion views. Complications:            No immediate complications. Estimated Blood Loss:     Estimated blood loss was minimal. Impression:               - Nodular mucosa in the terminal ileum. Biopsied.                            Tiny white nodules - probably lymphangiectasia                           - The entire examined colon is normal. Biopsied to                            evaluate for microscopic colitis given the prior                            colitis in 2019                           -  The examination was otherwise normal on direct                            and retroflexion views. Recommendation:           - Patient has a contact number available for                            emergencies. The signs and symptoms of potential                            delayed complications were discussed with the                            patient. Return to normal activities tomorrow.                            Written discharge instructions were provided to the                            patient.                           - Resume previous diet.                           - Continue present medications.                           - Await pathology results.                           - Repeat colonoscopy is recommended. The                            colonoscopy date will be determined after pathology                            results from today's exam become available for                            review. Lupita FORBES Commander, MD 01/15/2024 11:26:54 AM This report has been signed electronically.

## 2024-01-15 NOTE — Patient Instructions (Addendum)
 Resume previous diet. Continue present medications. Awaiting pathology results. Repeat colonoscopy is recommended. The colonoscopy date will be determined after pathology results from today's exam.   YOU HAD AN ENDOSCOPIC PROCEDURE TODAY AT THE [facility]:   Refer to the procedure report that was given to you for any specific questions about what was found during the examination.  If the procedure report does not answer your questions, please call your gastroenterologist to clarify.  If you requested that your care partner not be given the details of your procedure findings, then the procedure report has been included in a sealed envelope for you to review at your convenience later.  YOU SHOULD EXPECT: Some feelings of bloating in the abdomen. Passage of more gas than usual.  Walking can help get rid of the air that was put into your GI tract during the procedure and reduce the bloating. If you had a lower endoscopy (such as a colonoscopy or flexible sigmoidoscopy) you may notice spotting of blood in your stool or on the toilet paper. If you underwent a bowel prep for your procedure, you may not have a normal bowel movement for a few days.  Please Note:  You might notice some irritation and congestion in your nose or some drainage.  This is from the oxygen used during your procedure.  There is no need for concern and it should clear up in a day or so.  SYMPTOMS TO REPORT IMMEDIATELY:  Following lower endoscopy (colonoscopy or flexible sigmoidoscopy):  Excessive amounts of blood in the stool  Significant tenderness or worsening of abdominal pains  Swelling of the abdomen that is new, acute  Fever of 100F or higher  For urgent or emergent issues, a gastroenterologist can be reached at any hour by calling [phone]. Do not use MyChart messaging for urgent concerns.    DIET:  We do recommend a small meal at first, but then you may proceed to your regular diet.  Drink plenty of  fluids but you should avoid alcoholic beverages for 24 hours.  ACTIVITY:  You should plan to take it easy for the rest of today and you should NOT DRIVE or use heavy machinery until tomorrow (because of the sedation medicines used during the test).    FOLLOW UP: Our staff will call the number listed on your records the next business day following your procedure.  We will call around 7:15- 8:00 am to check on you and address any questions or concerns that you may have regarding the information given to you following your procedure. If we do not reach you, we will leave a message.     If any biopsies were taken you will be contacted by phone or by letter within the next 1-3 weeks.  Please call us  at [phone] if you have not heard about the biopsies in 3 weeks.    SIGNATURES/CONFIDENTIALITY: You and/or your care partner have signed paperwork which will be entered into your electronic medical record.  These signatures attest to the fact that that the information above on your After Visit Summary has been reviewed and is understood.  Full responsibility of the confidentiality of this discharge information lies with you and/or your care-partner.No signs of polyps or cancer seen.  The lining of the small intestine looked different - probably something called lymphangiectasia. I biopsied that and also biopsied the colon to check for inflammation since you had that in 2019.  I will notify about results and recommendations.  I  appreciate the opportunity to care for you. Leah Bailey Leah Commander, MD, [name]

## 2024-01-16 ENCOUNTER — Telehealth: Payer: Self-pay

## 2024-01-16 NOTE — Telephone Encounter (Signed)
 No answer after follow up call. Voice message left.

## 2024-01-20 LAB — SURGICAL PATHOLOGY

## 2024-01-21 ENCOUNTER — Ambulatory Visit: Payer: Self-pay | Admitting: Internal Medicine

## 2024-02-26 ENCOUNTER — Ambulatory Visit
Admission: RE | Admit: 2024-02-26 | Discharge: 2024-02-26 | Disposition: A | Source: Ambulatory Visit | Attending: Registered Nurse

## 2024-02-26 DIAGNOSIS — Z1231 Encounter for screening mammogram for malignant neoplasm of breast: Secondary | ICD-10-CM
